# Patient Record
Sex: Male | Born: 1945 | Race: White | Hispanic: No | Marital: Married | Smoking: Never smoker
Health system: Southern US, Community
[De-identification: ages and names within clinical notes are randomized; demographics above are authoritative.]

## PROBLEM LIST (undated history)

## (undated) DIAGNOSIS — M549 Dorsalgia, unspecified: Secondary | ICD-10-CM

## (undated) DIAGNOSIS — T7840XA Allergy, unspecified, initial encounter: Secondary | ICD-10-CM

## (undated) DIAGNOSIS — M48061 Spinal stenosis, lumbar region without neurogenic claudication: Secondary | ICD-10-CM

## (undated) DIAGNOSIS — D696 Thrombocytopenia, unspecified: Secondary | ICD-10-CM

## (undated) DIAGNOSIS — K219 Gastro-esophageal reflux disease without esophagitis: Secondary | ICD-10-CM

## (undated) DIAGNOSIS — R0602 Shortness of breath: Secondary | ICD-10-CM

## (undated) DIAGNOSIS — R079 Chest pain, unspecified: Secondary | ICD-10-CM

## (undated) DIAGNOSIS — F329 Major depressive disorder, single episode, unspecified: Secondary | ICD-10-CM

## (undated) DIAGNOSIS — E785 Hyperlipidemia, unspecified: Secondary | ICD-10-CM

## (undated) DIAGNOSIS — I10 Essential (primary) hypertension: Secondary | ICD-10-CM

## (undated) DIAGNOSIS — F419 Anxiety disorder, unspecified: Secondary | ICD-10-CM

## (undated) DIAGNOSIS — F32A Depression, unspecified: Secondary | ICD-10-CM

## (undated) DIAGNOSIS — M5430 Sciatica, unspecified side: Secondary | ICD-10-CM

## (undated) DIAGNOSIS — Z789 Other specified health status: Secondary | ICD-10-CM

## (undated) DIAGNOSIS — H8109 Meniere's disease, unspecified ear: Secondary | ICD-10-CM

## (undated) DIAGNOSIS — R252 Cramp and spasm: Secondary | ICD-10-CM

## (undated) DIAGNOSIS — I2510 Atherosclerotic heart disease of native coronary artery without angina pectoris: Secondary | ICD-10-CM

## (undated) DIAGNOSIS — Z8719 Personal history of other diseases of the digestive system: Secondary | ICD-10-CM

## (undated) DIAGNOSIS — I4510 Unspecified right bundle-branch block: Secondary | ICD-10-CM

## (undated) DIAGNOSIS — G4730 Sleep apnea, unspecified: Secondary | ICD-10-CM

## (undated) DIAGNOSIS — N400 Enlarged prostate without lower urinary tract symptoms: Secondary | ICD-10-CM

## (undated) DIAGNOSIS — G4733 Obstructive sleep apnea (adult) (pediatric): Secondary | ICD-10-CM

## (undated) HISTORY — PX: BACK SURGERY: SHX140

## (undated) HISTORY — DX: Cramp and spasm: R25.2

## (undated) HISTORY — DX: Thrombocytopenia, unspecified: D69.6

## (undated) HISTORY — DX: Anxiety disorder, unspecified: F41.9

## (undated) HISTORY — DX: Spinal stenosis, lumbar region without neurogenic claudication: M48.061

## (undated) HISTORY — DX: Meniere's disease, unspecified ear: H81.09

## (undated) HISTORY — PX: NASAL POLYP EXCISION: SHX2068

## (undated) HISTORY — DX: Chest pain, unspecified: R07.9

## (undated) HISTORY — PX: TONSILLECTOMY: SHX5217

## (undated) HISTORY — DX: Hyperlipidemia, unspecified: E78.5

## (undated) HISTORY — DX: Atherosclerotic heart disease of native coronary artery without angina pectoris: I25.10

## (undated) HISTORY — DX: Shortness of breath: R06.02

## (undated) HISTORY — DX: Gastro-esophageal reflux disease without esophagitis: K21.9

## (undated) HISTORY — DX: Personal history of other diseases of the digestive system: Z87.19

## (undated) HISTORY — DX: Benign prostatic hyperplasia without lower urinary tract symptoms: N40.0

## (undated) HISTORY — DX: Essential (primary) hypertension: I10

## (undated) HISTORY — DX: Major depressive disorder, single episode, unspecified: F32.9

## (undated) HISTORY — DX: Unspecified right bundle-branch block: I45.10

## (undated) HISTORY — PX: COLONOSCOPY: SHX174

## (undated) HISTORY — DX: Sleep apnea, unspecified: G47.30

## (undated) HISTORY — DX: Allergy, unspecified, initial encounter: T78.40XA

## (undated) HISTORY — DX: Obstructive sleep apnea (adult) (pediatric): G47.33

## (undated) HISTORY — DX: Depression, unspecified: F32.A

## (undated) HISTORY — DX: Other specified health status: Z78.9

## (undated) HISTORY — DX: Dorsalgia, unspecified: M54.9

## (undated) HISTORY — DX: Sciatica, unspecified side: M54.30

## (undated) HISTORY — PX: CARDIAC CATHETERIZATION: SHX172

---

## 1999-03-10 ENCOUNTER — Emergency Department (HOSPITAL_COMMUNITY): Admission: EM | Admit: 1999-03-10 | Discharge: 1999-03-10 | Payer: Self-pay | Admitting: Emergency Medicine

## 1999-11-10 ENCOUNTER — Ambulatory Visit (HOSPITAL_BASED_OUTPATIENT_CLINIC_OR_DEPARTMENT_OTHER): Admission: RE | Admit: 1999-11-10 | Discharge: 1999-11-10 | Payer: Self-pay | Admitting: Otolaryngology

## 2003-11-21 ENCOUNTER — Emergency Department (HOSPITAL_COMMUNITY): Admission: EM | Admit: 2003-11-21 | Discharge: 2003-11-21 | Payer: Self-pay | Admitting: Emergency Medicine

## 2003-12-21 ENCOUNTER — Inpatient Hospital Stay (HOSPITAL_BASED_OUTPATIENT_CLINIC_OR_DEPARTMENT_OTHER): Admission: RE | Admit: 2003-12-21 | Discharge: 2003-12-21 | Payer: Self-pay | Admitting: Cardiology

## 2005-01-24 ENCOUNTER — Ambulatory Visit: Payer: Self-pay | Admitting: Cardiology

## 2005-12-14 ENCOUNTER — Inpatient Hospital Stay (HOSPITAL_COMMUNITY): Admission: EM | Admit: 2005-12-14 | Discharge: 2005-12-17 | Payer: Self-pay | Admitting: Emergency Medicine

## 2005-12-14 ENCOUNTER — Ambulatory Visit: Payer: Self-pay | Admitting: Internal Medicine

## 2007-09-03 ENCOUNTER — Encounter: Payer: Self-pay | Admitting: Family Medicine

## 2007-12-08 ENCOUNTER — Ambulatory Visit: Payer: Self-pay | Admitting: Cardiology

## 2007-12-29 ENCOUNTER — Ambulatory Visit: Payer: Self-pay

## 2008-09-01 ENCOUNTER — Ambulatory Visit: Payer: Self-pay | Admitting: Family Medicine

## 2008-09-01 LAB — CONVERTED CEMR LAB
ALT: 37 units/L (ref 0–53)
AST: 44 units/L — ABNORMAL HIGH (ref 0–37)
Albumin: 4.1 g/dL (ref 3.5–5.2)
Alkaline Phosphatase: 60 units/L (ref 39–117)
BUN: 19 mg/dL (ref 6–23)
Basophils Absolute: 0 10*3/uL (ref 0.0–0.1)
Basophils Relative: 0.1 % (ref 0.0–3.0)
Bilirubin Urine: NEGATIVE
Bilirubin, Direct: 0 mg/dL (ref 0.0–0.3)
Blood in Urine, dipstick: NEGATIVE
CO2: 28 meq/L (ref 19–32)
Calcium: 9.2 mg/dL (ref 8.4–10.5)
Chloride: 107 meq/L (ref 96–112)
Cholesterol: 159 mg/dL (ref 0–200)
Creatinine, Ser: 0.9 mg/dL (ref 0.4–1.5)
Eosinophils Absolute: 0 10*3/uL (ref 0.0–0.7)
Eosinophils Relative: 0.7 % (ref 0.0–5.0)
GFR calc non Af Amer: 90.41 mL/min (ref >60)
Glucose, Bld: 104 mg/dL — ABNORMAL HIGH (ref 70–99)
Glucose, Urine, Semiquant: NEGATIVE
HCT: 40.6 % (ref 39.0–52.0)
HDL: 31.2 mg/dL — ABNORMAL LOW (ref >39.00)
Hemoglobin: 14 g/dL (ref 13.0–17.0)
Ketones, urine, test strip: NEGATIVE
LDL Cholesterol: 106 mg/dL — ABNORMAL HIGH (ref 0–99)
Lymphocytes Relative: 27.5 % (ref 12.0–46.0)
Lymphs Abs: 1.4 10*3/uL (ref 0.7–4.0)
MCHC: 34.4 g/dL (ref 30.0–36.0)
MCV: 88.4 fL (ref 78.0–100.0)
Monocytes Absolute: 0.5 10*3/uL (ref 0.1–1.0)
Monocytes Relative: 8.8 % (ref 3.0–12.0)
Neutro Abs: 3.3 10*3/uL (ref 1.4–7.7)
Neutrophils Relative %: 62.9 % (ref 43.0–77.0)
Nitrite: NEGATIVE
PSA: 0.41 ng/mL (ref 0.10–4.00)
Platelets: 121 10*3/uL — ABNORMAL LOW (ref 150.0–400.0)
Potassium: 4.1 meq/L (ref 3.5–5.1)
RBC: 4.6 M/uL (ref 4.22–5.81)
RDW: 12.9 % (ref 11.5–14.6)
Sodium: 141 meq/L (ref 135–145)
Specific Gravity, Urine: >=1.03
TSH: 1.27 microintl units/mL (ref 0.35–5.50)
Total Bilirubin: 0.9 mg/dL (ref 0.3–1.2)
Total CHOL/HDL Ratio: 5
Total Protein: 7.2 g/dL (ref 6.0–8.3)
Triglycerides: 110 mg/dL (ref 0.0–149.0)
Urobilinogen, UA: 1
VLDL: 22 mg/dL (ref 0.0–40.0)
WBC Urine, dipstick: NEGATIVE
WBC: 5.2 10*3/uL (ref 4.5–10.5)
pH: 6

## 2008-09-13 ENCOUNTER — Ambulatory Visit: Payer: Self-pay | Admitting: Family Medicine

## 2008-09-13 DIAGNOSIS — F39 Unspecified mood [affective] disorder: Secondary | ICD-10-CM | POA: Insufficient documentation

## 2008-09-13 DIAGNOSIS — F3289 Other specified depressive episodes: Secondary | ICD-10-CM

## 2008-09-13 DIAGNOSIS — E785 Hyperlipidemia, unspecified: Secondary | ICD-10-CM

## 2008-09-13 DIAGNOSIS — F329 Major depressive disorder, single episode, unspecified: Secondary | ICD-10-CM

## 2008-09-13 DIAGNOSIS — K219 Gastro-esophageal reflux disease without esophagitis: Secondary | ICD-10-CM

## 2008-09-13 DIAGNOSIS — D696 Thrombocytopenia, unspecified: Secondary | ICD-10-CM

## 2008-09-13 HISTORY — DX: Hyperlipidemia, unspecified: E78.5

## 2008-09-13 HISTORY — DX: Other specified depressive episodes: F32.89

## 2008-09-13 HISTORY — DX: Thrombocytopenia, unspecified: D69.6

## 2008-09-13 HISTORY — DX: Major depressive disorder, single episode, unspecified: F32.9

## 2008-09-13 HISTORY — DX: Gastro-esophageal reflux disease without esophagitis: K21.9

## 2008-10-11 ENCOUNTER — Ambulatory Visit: Payer: Self-pay | Admitting: Family Medicine

## 2008-12-13 ENCOUNTER — Ambulatory Visit: Payer: Self-pay | Admitting: Family Medicine

## 2008-12-14 LAB — CONVERTED CEMR LAB
ALT: 27 units/L (ref 0–53)
AST: 30 units/L (ref 0–37)
Albumin: 4 g/dL (ref 3.5–5.2)
Alkaline Phosphatase: 58 units/L (ref 39–117)
Basophils Absolute: 0 10*3/uL (ref 0.0–0.1)
Basophils Relative: 0.4 % (ref 0.0–3.0)
Bilirubin, Direct: 0.1 mg/dL (ref 0.0–0.3)
Cholesterol: 150 mg/dL (ref 0–200)
Eosinophils Absolute: 0.1 10*3/uL (ref 0.0–0.7)
Eosinophils Relative: 1.6 % (ref 0.0–5.0)
HCT: 41.2 % (ref 39.0–52.0)
HDL: 30.7 mg/dL — ABNORMAL LOW (ref >39.00)
Hemoglobin: 14.1 g/dL (ref 13.0–17.0)
LDL Cholesterol: 90 mg/dL (ref 0–99)
Lymphocytes Relative: 32.1 % (ref 12.0–46.0)
Lymphs Abs: 1.6 10*3/uL (ref 0.7–4.0)
MCHC: 34.2 g/dL (ref 30.0–36.0)
MCV: 88.7 fL (ref 78.0–100.0)
Monocytes Absolute: 0.5 10*3/uL (ref 0.1–1.0)
Monocytes Relative: 9.9 % (ref 3.0–12.0)
Neutro Abs: 2.9 10*3/uL (ref 1.4–7.7)
Neutrophils Relative %: 56 % (ref 43.0–77.0)
Platelets: 127 10*3/uL — ABNORMAL LOW (ref 150.0–400.0)
RBC: 4.65 M/uL (ref 4.22–5.81)
RDW: 13.7 % (ref 11.5–14.6)
Total Bilirubin: 1.2 mg/dL (ref 0.3–1.2)
Total CHOL/HDL Ratio: 5
Total Protein: 7.1 g/dL (ref 6.0–8.3)
Triglycerides: 148 mg/dL (ref 0.0–149.0)
VLDL: 29.6 mg/dL (ref 0.0–40.0)
WBC: 5.1 10*3/uL (ref 4.5–10.5)

## 2008-12-22 ENCOUNTER — Ambulatory Visit: Payer: Self-pay | Admitting: Family Medicine

## 2009-02-09 ENCOUNTER — Telehealth: Payer: Self-pay | Admitting: Family Medicine

## 2009-09-13 ENCOUNTER — Ambulatory Visit: Payer: Self-pay | Admitting: Family Medicine

## 2009-09-13 LAB — CONVERTED CEMR LAB
ALT: 34 units/L (ref 0–53)
AST: 49 units/L — ABNORMAL HIGH (ref 0–37)
Albumin: 4.3 g/dL (ref 3.5–5.2)
Alkaline Phosphatase: 62 units/L (ref 39–117)
BUN: 17 mg/dL (ref 6–23)
Basophils Absolute: 0 10*3/uL (ref 0.0–0.1)
Basophils Relative: 0.7 % (ref 0.0–3.0)
Bilirubin Urine: NEGATIVE
Bilirubin, Direct: 0.1 mg/dL (ref 0.0–0.3)
Blood in Urine, dipstick: NEGATIVE
CO2: 31 meq/L (ref 19–32)
Calcium: 9.5 mg/dL (ref 8.4–10.5)
Chloride: 107 meq/L (ref 96–112)
Cholesterol: 161 mg/dL (ref 0–200)
Creatinine, Ser: 1 mg/dL (ref 0.4–1.5)
Eosinophils Absolute: 0.1 10*3/uL (ref 0.0–0.7)
Eosinophils Relative: 1.6 % (ref 0.0–5.0)
GFR calc non Af Amer: 79.8 mL/min (ref >60)
Glucose, Bld: 100 mg/dL — ABNORMAL HIGH (ref 70–99)
Glucose, Urine, Semiquant: NEGATIVE
HCT: 41.5 % (ref 39.0–52.0)
HDL: 33.9 mg/dL — ABNORMAL LOW (ref >39.00)
Hemoglobin: 14.3 g/dL (ref 13.0–17.0)
Ketones, urine, test strip: NEGATIVE
LDL Cholesterol: 95 mg/dL (ref 0–99)
Lymphocytes Relative: 28 % (ref 12.0–46.0)
Lymphs Abs: 1.6 10*3/uL (ref 0.7–4.0)
MCHC: 34.4 g/dL (ref 30.0–36.0)
MCV: 90.5 fL (ref 78.0–100.0)
Monocytes Absolute: 0.5 10*3/uL (ref 0.1–1.0)
Monocytes Relative: 9.2 % (ref 3.0–12.0)
Neutro Abs: 3.5 10*3/uL (ref 1.4–7.7)
Neutrophils Relative %: 60.5 % (ref 43.0–77.0)
Nitrite: NEGATIVE
PSA: 0.48 ng/mL (ref 0.10–4.00)
Platelets: 131 10*3/uL — ABNORMAL LOW (ref 150.0–400.0)
Potassium: 5.1 meq/L (ref 3.5–5.1)
Protein, U semiquant: NEGATIVE
RBC: 4.58 M/uL (ref 4.22–5.81)
RDW: 13.4 % (ref 11.5–14.6)
Sodium: 145 meq/L (ref 135–145)
Specific Gravity, Urine: 1.02
TSH: 2.96 microintl units/mL (ref 0.35–5.50)
Total Bilirubin: 0.5 mg/dL (ref 0.3–1.2)
Total CHOL/HDL Ratio: 5
Total Protein: 7 g/dL (ref 6.0–8.3)
Triglycerides: 163 mg/dL — ABNORMAL HIGH (ref 0.0–149.0)
Urobilinogen, UA: 0.2
VLDL: 32.6 mg/dL (ref 0.0–40.0)
WBC Urine, dipstick: NEGATIVE
WBC: 5.7 10*3/uL (ref 4.5–10.5)
pH: 5.5

## 2009-09-20 ENCOUNTER — Ambulatory Visit: Payer: Self-pay | Admitting: Family Medicine

## 2009-10-16 ENCOUNTER — Telehealth: Payer: Self-pay | Admitting: Family Medicine

## 2009-10-23 DIAGNOSIS — I2510 Atherosclerotic heart disease of native coronary artery without angina pectoris: Secondary | ICD-10-CM | POA: Insufficient documentation

## 2009-10-23 DIAGNOSIS — G4733 Obstructive sleep apnea (adult) (pediatric): Secondary | ICD-10-CM

## 2009-10-23 HISTORY — DX: Obstructive sleep apnea (adult) (pediatric): G47.33

## 2009-10-23 HISTORY — DX: Atherosclerotic heart disease of native coronary artery without angina pectoris: I25.10

## 2009-10-26 ENCOUNTER — Ambulatory Visit: Payer: Self-pay | Admitting: Cardiology

## 2009-10-26 DIAGNOSIS — I4510 Unspecified right bundle-branch block: Secondary | ICD-10-CM

## 2009-10-26 HISTORY — DX: Unspecified right bundle-branch block: I45.10

## 2009-12-27 ENCOUNTER — Encounter: Payer: Self-pay | Admitting: Gastroenterology

## 2010-06-12 NOTE — Assessment & Plan Note (Signed)
Summary: cpx labs-v70.0//ccm   Vital Signs:  Patient profile:   65 year old male Height:      67.5 inches Weight:      204 pounds BMI:     31.59 Temp:     98.1 degrees F oral Pulse rate:   80 / minute Pulse rhythm:   regular Resp:     12 per minute BP sitting:   132 / 82  (left arm) Cuff size:   regular  Vitals Entered By: Sid Falcon LPN (Sep 20, 2009 10:35 AM)  Nutrition Counseling: Patient's BMI is greater than 25 and therefore counseled on weight management options. CC: CPX, labs done   History of Present Illness: Patient for complete physical examination.  Past medical history reviewed. Past history of mild depression, GERD hyperlipidemia, and mild chronic thrombocytopenia. Seasonal allergies.  Recent achiness in both calf muscles mostly at night. He thinks this may be related to Crestor. No intolerance to walking. Generally exercise about 45 minutes per day. Denies restless leg symptoms. No claudication symptoms.  On low dose Citalopram and has reduced to every other day on his own recently without adverse event.  Queries discontinuing this medication.  Last tetanus unknown. Due for repeat colonoscopy this year which he will set up. Prior history of shingles clinically about 8 years ago.  Social history family history reviewed no significant changes.  Preventive Screening-Counseling & Management  Alcohol-Tobacco     Smoking Status: never  Allergies: 1)  Codeine Sulfate (Codeine Sulfate)  Past History:  Past Medical History: Last updated: 09/13/2008 Depression GERD Hyperlipidemia Heart disease, Non Obstructive CAD  Past Surgical History: Last updated: 09/13/2008 Tonsillectomy  Family History: Last updated: 09/20/2009 Mother CHF ?etiology  Social History: Last updated: 09/13/2008 Occupation:  [employer] & Anna [employer] Married Regular exercise-yes  Risk Factors: Exercise: yes (09/13/2008)  Risk Factors: Smoking Status: never  (09/20/2009)  Family History: Mother CHF ?etiology  Review of Systems  The patient denies anorexia, fever, weight loss, weight gain, vision loss, decreased hearing, hoarseness, chest pain, syncope, dyspnea on exertion, peripheral edema, prolonged cough, headaches, hemoptysis, abdominal pain, melena, hematochezia, severe indigestion/heartburn, hematuria, incontinence, genital sores, muscle weakness, suspicious skin lesions, transient blindness, difficulty walking, depression, unusual weight change, abnormal bleeding, enlarged lymph nodes, and testicular masses.         Patient has intermittent bilateral calf pain at night  Physical Exam  General:  Well-developed,well-nourished,in no acute distress; alert,appropriate and cooperative throughout examination Head:  Normocephalic and atraumatic without obvious abnormalities. No apparent alopecia or balding. Eyes:  No corneal or conjunctival inflammation noted. EOMI. Perrla. Funduscopic exam benign, without hemorrhages, exudates or papilledema. Vision grossly normal. Ears:  External ear exam shows no significant lesions or deformities.  Otoscopic examination reveals clear canals, tympanic membranes are intact bilaterally without bulging, retraction, inflammation or discharge. Hearing is grossly normal bilaterally. Mouth:  Oral mucosa and oropharynx without lesions or exudates.  Teeth in good repair. Neck:  No deformities, masses, or tenderness noted. Lungs:  Normal respiratory effort, chest expands symmetrically. Lungs are clear to auscultation, no crackles or wheezes. Heart:  Normal rate and regular rhythm. S1 and S2 normal without gallop, murmur, click, rub or other extra sounds. Abdomen:  Bowel sounds positive,abdomen soft and non-tender without masses, organomegaly or hernias noted. Msk:  No deformity or scoliosis noted of thoracic or lumbar spine.   Extremities:  No clubbing, cyanosis, edema, or deformity noted with normal full range of motion  of all joints.   Neurologic:  No  cranial nerve deficits noted. Station and gait are normal. Plantar reflexes are down-going bilaterally. DTRs are symmetrical throughout. Sensory, motor and coordinative functions appear intact. Skin:  Intact without suspicious lesions or rashes Cervical Nodes:  No lymphadenopathy noted Psych:  normally interactive, good eye contact, not anxious appearing, and not depressed appearing.     Impression & Recommendations:  Problem # 1:  ROUTINE GENERAL MEDICAL EXAM@HEALTH  CARE FACL (ICD-V70.0) work on weight  loss.  Labs reviewed with pt.  Pt will set up repeat colonoscopy.  Tdap given.  Problem # 2:  HYPERLIPIDEMIA (ICD-272.4) ?leg myalgias related to Crestor.  Pt will d/c Crestor for one month and if symptoms resolve OFF med try back low dose 5mg  Crestor which he has tolerated in past. His updated medication list for this problem includes:    Crestor 10 Mg Tabs (Rosuvastatin calcium) ..... One by mouth once daily  Complete Medication List: 1)  Fexofenadine Hcl 180 Mg Tabs (Fexofenadine hcl) .... Once daily 2)  Crestor 10 Mg Tabs (Rosuvastatin calcium) .... One by mouth once daily 3)  Ra Omeprazole 20 Mg Tbec (Omeprazole) .... Once tab every other day 4)  Adult Aspirin Ec Low Strength 81 Mg Tbec (Aspirin) .... Once daily 5)  Fluticasone Propionate 50 Mcg/act Susp (Fluticasone propionate) .... 2 sprays to each nostril daily  Other Orders: Tdap => 52yrs IM ([address]) Admin 1st Vaccine (60454)  Patient Instructions: 1)  It is important that you exercise reguarly at least 20 minutes 5 times a week. If you develop chest pain, have severe difficulty breathing, or feel very tired, stop exercising immediately and seek medical attention.  2)  You need to lose weight. Consider a lower calorie diet and regular exercise.  3)  discontinue iron supplement 4)  Discontinue citalopram 5)  Schedule repeat colonoscopy     Immunizations Administered:  Tetanus Vaccine:     Vaccine Type: Tdap    Site: left deltoid    Mfr: GlaxoSmithKline    Dose: 0.5 ml    Route: IM    Given by: Sid Falcon LPN    Exp. Date: 08/05/2011    Lot #: [id number]

## 2010-06-12 NOTE — Letter (Signed)
Summary: New Patient letter  [facility]  [address] [place], [place] [address]   Phone: [phone]  Fax: [phone]       12/27/2009 MRN: [id number]  [facility] [address] [place], [place]  [address]  Dear Mr. Troost,  Welcome to the Gastroenterology Division at [facility].    You are scheduled to see Dr.  Jarold Motto on  02-22-10 at 9am on the 3rd floor at [facility], [address].  We ask that you try to arrive at our office 15 minutes prior to your appointment time to allow for check-in.  We would like you to complete the enclosed self-administered evaluation form prior to your visit and bring it with you on the day of your appointment.  We will review it with you.  Also, please bring a complete list of all your medications or, if you prefer, bring the medication bottles and we will list them.  Please bring your insurance card so that we may make a copy of it.  If your insurance requires a referral to see a specialist, please bring your referral form from your primary care physician.  Co-payments are due at the time of your visit and may be paid by cash, check or credit card.     Your office visit will consist of a consult with your physician (includes a physical exam), any laboratory testing he/she may order, scheduling of any necessary diagnostic testing (e.g. x-ray, ultrasound, CT-scan), and scheduling of a procedure (e.g. Endoscopy, Colonoscopy) if required.  Please allow enough time on your schedule to allow for any/all of these possibilities.    If you cannot keep your appointment, please call [phone] to cancel or reschedule prior to your appointment date.  This allows [place] the opportunity to schedule an appointment for another patient in need of care.  If you do not cancel or reschedule by 5 p.m. the business day prior to your appointment date, you will be charged a $50.00 late cancellation/no-show fee.    Thank you for choosing   Gastroenterology for your medical needs.  We appreciate the opportunity to care for you.  Please visit [place] at our website  to learn more about our practice.                     Sincerely,                                                             The Gastroenterology Division

## 2010-06-12 NOTE — Letter (Signed)
Summary: Records from [facility] 2006 - 2009  Records from [facility] 2006 - 2009   Imported By: Maryln Gottron 09/29/2008 14:09:43  _____________________________________________________________________  External Attachment:    Type:   Image     Comment:   External Document

## 2010-06-12 NOTE — Assessment & Plan Note (Signed)
Summary: f2y/jss   Visit Type:  2 yr f/u Primary Provider:  Evelena Peat MD  CC:  pt c/o bilateral leg pain, says they are not leg cramps, and denies any pain when walking...denies any cp or sob or edema.  History of Present Illness: Mr Stickels her returns today for evaluation management of his coronary disease.  He's having no angina or ischemic symptoms. His biggest complaint is that of his legs aching at night to the point he can't sleep. He ran a stop primary care he's been referred to Dr. Darrick Penna of sports medicine. He denies any claudication or symptoms of circulatory insufficiency. It is not positional. He denies any injury to his back. He's had no bladder or bowel problems.  He denies any symptoms of orthopnea, PND or edema. He's had no palpitations or syncope. He has a chronic right bundle.  Current Medications (verified): 1)  Crestor 5 Mg Tabs (Rosuvastatin Calcium) .Marland Kitchen.. 1 Tab At Bedtime 2)  Ra Omeprazole 20 Mg Tbec (Omeprazole) .... Once Tab Every Other Day 3)  Adult Aspirin Ec Low Strength 81 Mg Tbec (Aspirin) .... Once Daily 4)  Fluticasone Propionate 50 Mcg/act Susp (Fluticasone Propionate) .... 2 Sprays To Each Nostril Daily 5)  Citalopram Hydrobromide 10 Mg Tabs (Citalopram Hydrobromide) .Marland Kitchen.. 1 Tab Every Other Day  Allergies: 1)  Codeine Sulfate (Codeine Sulfate)  Past History:  Past Medical History: Last updated: 11/20/09 Depression GERD Hyperlipidemia Heart disease, Non Obstructive CAD History of false-positive stress test suggestive of inferior ischemia. Obstructive sleep apnea on C-PAP. Possible benign prostatic hypertrophy.  Past Surgical History: Last updated: 09/13/2008 Tonsillectomy  Family History: Last updated: 11-20-2009  Mother deceased at age 46, history of congestive heart  failure.  Father deceased age 18, questionable myocardial infarction;  history of COPD.  Sister age 57, hypertension and hyperlipidemia.  Social History: Last updated:  2009/11/20 Occupation:  [employer] & [employer] Married Regular exercise-yes Tobacco Use - No.  Alcohol Use - yes Drug Use - no  Risk Factors: Exercise: yes (09/13/2008)  Risk Factors: Smoking Status: never (11-20-09)  Review of Systems       nother than history of present illness  Vital Signs:  Patient profile:   65 year old male Height:      67.5 inches Weight:      196 pounds BMI:     30.35 Pulse rate:   73 / minute Pulse rhythm:   irregular BP sitting:   118 / 70  (left arm) Cuff size:   large  Vitals Entered By: Danielle Rankin, CMA (October 26, 2009 3:13 PM)  Physical Exam  General:  Well developed, well nourished, in no acute distress. Head:  normocephalic and atraumatic Eyes:  PERRLA/EOM intact; conjunctiva and lids normal. Neck:  Neck supple, no JVD. No masses, thyromegaly or abnormal cervical nodes. Chest Kendrix Orman:  no deformities or breast masses noted Lungs:  Clear bilaterally to auscultation and percussion. Heart:  Non-displaced PMI, chest non-tender; regular rate and rhythm, S1, S2 without murmurs, rubs or gallops. Carotid upstroke normal, no bruit. Normal abdominal aortic size, no bruits. Femorals normal pulses, no bruits. Pedals normal pulses. No edema, no varicosities. Abdomen:  Bowel sounds positive; abdomen soft and non-tender without masses, organomegaly, or hernias noted. No hepatosplenomegaly. Msk:  Back normal, normal gait. Muscle strength and tone normal. Pulses:  pulses normal in all 4 extremities Extremities:  No clubbing or cyanosis. Neurologic:  Alert and oriented x 3. Skin:  Intact without lesions or rashes. Psych:  Normal  affect.   Problems:  Medical Problems Added: 1)  Dx of Rbbb  (ICD-426.4)  Impression & Recommendations:  Problem # 1:  CAD, NATIVE VESSEL (ICD-414.01) Assessment Unchanged  His updated medication list for this problem includes:    Adult Aspirin Ec Low Strength 81 Mg Tbec (Aspirin) ..... Once daily  Problem # 2:   OBSTRUCTIVE SLEEP APNEA (ICD-327.23)  Problem # 3:  HYPERLIPIDEMIA (ICD-272.4)  His updated medication list for this problem includes:    Crestor 5 Mg Tabs (Rosuvastatin calcium) .Marland Kitchen... 1 tab at bedtime  Problem # 4:  RBBB (ICD-426.4)  His updated medication list for this problem includes:    Adult Aspirin Ec Low Strength 81 Mg Tbec (Aspirin) ..... Once daily  Clinical Reports Reviewed:  Cardiac Cath:  12/16/2005: Cardiac Cath Findings:   COMPLICATIONS:  None.   FINDINGS:  1. LV:  119/4/9.  EF 65% without regional Anjel Perfetti motion abnormality.  2. No aortic stenosis or mitral regurgitation.  3. Left main:  Angiographically normal.  4. LAD:  Moderate-sized vessel giving rise to two small diagonals.  The      proximal LAD has a 20% stenosis.  A septal has an 80% stenosis at its      ostium.  The mid LAD has a focal 30% stenosis.  5. Ramus intermedius:  Fairly small vessel which is angiographically      normal.  6. Circumflex:  Small vessel with 30% stenosis in the mid vessel.  7. RCA:  Large, dominant vessel.  There is a calcified 30% stenosis of the      mid vessel.  There is a focal 40% stenosis approximately 20 mm prior to      the origin of the PDA.   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  I do not think  that this septal is the etiology of his chest discomfort.  Instead, I  suspect a noncardiac etiology to his chest pain.  Given his substantial  burden of atherosclerosis, aggressive secondary prevention should be  continued.   Salvadore Farber, M.D. [facility]  Electronically Signed  12/21/2003: Cardiac Cath Findings:   LEFT VENTRICULOGRAM:  The left ventriculogram performed in the RAO  projection showed good Binnie Droessler motion with no areas of hypokinesis.  The  estimated ejection fraction was 60%.   DISTAL AORTOGRAM:  A distal aortogram was performed which showed patent  renal arteries  and no significant aortoiliac obstruction.   CONCLUSIONS:  Mild, non-obstructive coronary artery disease and normal left  ventricular function.   RECOMMENDATIONS:  Reassurance.  In view of these findings, I think the  patient's recent symptoms are not cardiac in etiology.  I do not think his  scan has significance and I think his scan represents a false positive test  possibly related to diaphragmatic attenuation.  Will plan reassurance and  plan secondary risk factor modification.                                             Charlies Constable, M.D. [facility]  Nuclear Study:  12/29/2007:  Excerise capacity: Good exercise capacity  Blood Pressure response: Normal blood pressure response  Clinical symptoms: No chest pain or dyspnea  ECG impression: No significant ST segment change suggestive of ischemia  Overall impression: Low risk stress nuclear study  with probable diaphragmatic attenuation and very mild inferior ischemia.  Olga Millers, MD   Patient Instructions: 1)  Your physician recommends that you schedule a follow-up appointment in: YEAR WITH DR Houda Brau 2)  Your physician recommends that you continue on your current medications as directed. Please refer to the Current Medication list given to you today.

## 2010-06-12 NOTE — Progress Notes (Signed)
Summary: speak to Dr. Caryl Never  Phone Note Call from Patient   Summary of Call: Pt is not having any improvement with leg pain since going off of the statins...Marland KitchenMarland KitchenWould like Dr. Caryl Never to cal him, please.  Has some other issues he would like to talk to him about.  [phone] Initial call taken by: Lynann Beaver CMA,  October 16, 2009 3:35 PM  Follow-up for Phone Call        spoke with pt.  He has rest pain in both calves which has not improved off crestor.  No exercise assoc symptoms.  No low back pain or any radicuopathy symptoms.  Pt inquires about [facility] and I think that would be an appropriate place to be evaluated.  He will inquire about getting this set up. Follow-up by: Evelena Peat MD,  October 16, 2009 5:40 PM

## 2010-06-12 NOTE — Assessment & Plan Note (Signed)
Summary: CPX/RCD   Vital Signs:  Patient profile:   65 year old male Height:      67.5 inches Weight:      203 pounds BMI:     31.44 Temp:     98.5 degrees F BP sitting:   130 / 80  (left arm) Cuff size:   regular  Vitals Entered By: Sid Falcon LPN (Sep 13, 1608 9:10 AM) CC: Previous [place] pt, to establish, CPX, labs printed   History of Present Illness: Patient is a 65 year old male who is seen today for complete physical examination. We are waiting for old records. He had colonoscopy which he thinks has been within the past 10 years. He also thinks his last tetanus was less than 10 years ago. He has chronic medical problems including nonobstructive coronary artery disease, hyperlipidemia, prediabetes, history of depression, GERD, and perennial allergies. He sees an allergist. His depression is stable. He takes Crestor regularly at 5 mg daily. No recent myalgias. Patient had a stress test approximately one year ago which showed no signs of ischemia. He exercises regularly and has not had any recent exertional symptoms. He has gained about 3-5 pounds in weight over the past year.  Preventive Screening-Counseling & Management     Smoking Status: never     Does Patient Exercise: yes  Allergies (verified): 1)  ! 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    Married    Regular exercise-yes     (09/13/2008)  Risk Factors:    Alcohol Use: N/A    >5 drinks/d w/in last 3 months: N/A    Caffeine Use: N/A    Diet: N/A    Exercise: yes (09/13/2008)  Risk Factors:    Smoking Status: never (09/13/2008)    Packs/Day: N/A    Cigars/wk: N/A    Pipe Use/wk: N/A    Cans of tobacco/wk: N/A    Passive Smoke Exposure: N/A  Past Medical History:    Depression    GERD    Hyperlipidemia    Heart disease, Non Obstructive CAD  Past  Surgical History:    Tonsillectomy  Family History:    Family History of Cardiovascular disorder  Social History:    Occupation:  Publishing rights manager    Married    Regular exercise-yes    Smoking Status:  never    Occupation:  employed    Does Patient Exercise:  yes  Review of Systems       The patient complains of weight gain.  The patient denies anorexia, fever, weight loss, vision loss, decreased hearing, chest pain, syncope, dyspnea on exertion, peripheral edema, prolonged cough, headaches, hemoptysis, abdominal pain, melena, hematochezia, severe indigestion/heartburn, hematuria, suspicious skin lesions, depression, abnormal bleeding, and testicular masses.    Physical Exam  General:  patient is alert and moderately obese Head:  Normocephalic and atraumatic without obvious abnormalities. No apparent alopecia or balding. Eyes:  No corneal or conjunctival inflammation noted. EOMI. Perrla. Funduscopic exam benign, without hemorrhages, exudates or papilledema. Vision grossly normal. Ears:  External ear exam shows no significant lesions or deformities.  Otoscopic examination reveals clear canals, tympanic membranes are intact bilaterally without bulging, retraction, inflammation or discharge. Hearing is grossly normal bilaterally. Mouth:  Oral mucosa and oropharynx without lesions or exudates.  Teeth in good repair. Neck:  no  masses. No carotid bruits. Lungs:  clear to auscultation throughout Heart:  regular rhythm and rate normal S1-S2. No S3. Abdomen:  soft and nontender without mass. No organomegaly noted. Rectal:  No external abnormalities noted. Normal sphincter tone. No rectal masses or tenderness. Prostate:  prostate on the small side and symmetrical no nodules Extremities:  no edema Neurologic:  No cranial nerve deficits noted. Station and gait are normal. Plantar reflexes are down-going bilaterally. DTRs are symmetrical throughout. Sensory, motor and coordinative  functions appear intact. Skin:  no suspicious skin lesions noted Cervical Nodes:  No lymphadenopathy noted   Impression & Recommendations:  Problem # 1:  Preventive Health Care (ICD-V70.0) Awaiting old records to confirm date of last colonoscopy and immunizations. Work on weight loss and continue regular exercise. Increase Crestor to tighten up his lipid control with recent LDL 106.  Problem # 2:  HYPERLIPIDEMIA (ICD-272.4) Goal LDL < 70.  Increase crestor to 10 mg. His updated medication list for this problem includes:    Crestor 10 Mg Tabs (Rosuvastatin calcium) ..... One by mouth once daily  Problem # 3:  THROMBOCYTOPENIA (ICD-287.5) Mild and possibly chronic. We'll recheck CBC in 3 months to reassess  Complete Medication List: 1)  Fexofenadine Hcl 180 Mg Tabs (Fexofenadine hcl) .... Once daily 2)  Citalopram Hydrobromide 10 Mg Tabs (Citalopram hydrobromide) .... Once daily 3)  Crestor 10 Mg Tabs (Rosuvastatin calcium) .... One by mouth once daily 4)  Ra Omeprazole 20 Mg Tbec (Omeprazole) .... Once daily 5)  Adult Aspirin Ec Low Strength 81 Mg Tbec (Aspirin) .... Once daily 6)  Fluticasone Propionate 50 Mcg/act Susp (Fluticasone propionate) .... 2 sprays to each nostril daily  Patient Instructions: 1)  It is important that you exercise reguarly at least 20 minutes 5 times a week. If you develop chest pain, have severe difficulty breathing, or feel very tired, stop exercising immediately and seek medical attention.  2)  You need to lose weight. Consider a lower calorie diet and regular exercise.  3)  Return in 3 months to repeat labs including lipid panel, hepatic panel, and CBC Prescriptions: CRESTOR 10 MG TABS (ROSUVASTATIN CALCIUM) one by mouth once daily  #90 x 3   Entered and Authorized by:   Evelena Peat MD   Signed by:   Evelena Peat MD on 09/13/2008   Method used:   Print then Give to Patient   RxID:   [id number] CITALOPRAM HYDROBROMIDE 10 MG TABS (CITALOPRAM  HYDROBROMIDE) once daily  #90 x 3   Entered and Authorized by:   Evelena Peat MD   Signed by:   Evelena Peat MD on 09/13/2008   Method used:   Print then Give to Patient   RxID:   [id number]

## 2010-06-12 NOTE — Assessment & Plan Note (Signed)
Summary: 3 MONTH ROV/NJR RSC PER PT/NJR   Vital Signs:  Patient profile:   65 year old male Weight:      198 pounds Temp:     98 degrees F oral BP sitting:   140 / 80  (left arm) Cuff size:   regular  Vitals Entered By: Sid Falcon LPN (December 22, 2008 10:53 AM) CC: 3 month ROV, F/U on Hyperlipidemia   History of Present Illness: Patient seen for followup. Recent mild thrombocytopenia and repeat lab work last week with stable platelet count 127,000. Normal white blood count and normal hematocrit. Current medications are reviewed. Compliant with medications. Recent lipids stable. Low HDL of 30. Patient exercises with walking. Occasional wine use. Liver functions stable. Patient denies any recent chest pains.  Allergies: 1)  Codeine Sulfate (Codeine Sulfate)  Past History:  Past Medical History: Last updated: 09/13/2008 Depression GERD Hyperlipidemia Heart disease, Non Obstructive CAD  Review of Systems  The patient denies weight loss, weight gain, chest pain, dyspnea on exertion, peripheral edema, prolonged cough, hemoptysis, and abdominal pain.    Physical Exam  General:  Well-developed,well-nourished,in no acute distress; alert,appropriate and cooperative throughout examination Mouth:  Oral mucosa and oropharynx without lesions or exudates.  Teeth in good repair. Neck:  No deformities, masses, or tenderness noted. Lungs:  Normal respiratory effort, chest expands symmetrically. Lungs are clear to auscultation, no crackles or wheezes. Heart:  Normal rate and regular rhythm. S1 and S2 normal without gallop, murmur, click, rub or other extra sounds. Extremities:  no peripheral edema noted.   Impression & Recommendations:  Problem # 1:  THROMBOCYTOPENIA (ICD-287.5) this appears to be chronic and recent labs are stable. No further evaluation recommended at this time.  Problem # 2:  HYPERLIPIDEMIA (ICD-272.4) discussed importance of regular exercise. We discussed other  possibilities such as niacin for low HDL at this point he wishes to wait. His updated medication list for this problem includes:    Crestor 10 Mg Tabs (Rosuvastatin calcium) ..... One by mouth once daily  Complete Medication List: 1)  Fexofenadine Hcl 180 Mg Tabs (Fexofenadine hcl) .... Once daily 2)  Citalopram Hydrobromide 10 Mg Tabs (Citalopram hydrobromide) .... Once daily 3)  Crestor 10 Mg Tabs (Rosuvastatin calcium) .... One by mouth once daily 4)  Ra Omeprazole 20 Mg Tbec (Omeprazole) .... Once daily 5)  Adult Aspirin Ec Low Strength 81 Mg Tbec (Aspirin) .... Once daily 6)  Fluticasone Propionate 50 Mcg/act Susp (Fluticasone propionate) .... 2 sprays to each nostril daily  Patient Instructions: 1)  Please schedule a follow-up appointment in 6 months .

## 2010-09-09 ENCOUNTER — Ambulatory Visit (HOSPITAL_BASED_OUTPATIENT_CLINIC_OR_DEPARTMENT_OTHER): Payer: 59 | Attending: Otolaryngology

## 2010-09-09 DIAGNOSIS — G4733 Obstructive sleep apnea (adult) (pediatric): Secondary | ICD-10-CM | POA: Insufficient documentation

## 2010-09-15 DIAGNOSIS — G4733 Obstructive sleep apnea (adult) (pediatric): Secondary | ICD-10-CM

## 2010-09-16 NOTE — Procedures (Signed)
NAME:  Donald Pope, Donald Pope                ACCOUNT NO.:  [id number]  MEDICAL RECORD NO.:  [id number]          PATIENT TYPE:  OUT  LOCATION:  SLEEP CENTER                 FACILITY:  [facility]  PHYSICIAN:  Clinton D. Maple Hudson, MD, FCCP, FACPDATE OF BIRTH:  12-25-44  DATE OF STUDY:  09/09/2010                           NOCTURNAL POLYSOMNOGRAM  REFERRING PHYSICIAN:  CHRISTOPHER E NEWMAN  INDICATION FOR STUDY:  Hypersomnia with sleep apnea.  EPWORTH SLEEPINESS SCORE:  9/24.  BMI 28.9.  Weight 190 pounds.  Height 68 inches.  MEDICATIONS:  Home medications charted and reviewed.  SLEEP ARCHITECTURE:  Total sleep time 272 minutes with sleep efficiency 68.4%.  Stage I was 34.4%, stage II 64.5%, stage III absent, REM 1.1% of total sleep time.  Sleep latency 8 minutes, REM latency 278 minutes, awake after sleep onset 117.5 minutes, arousal index 63.5.  Bedtime Medication:  Advil.  Sleep architecture was marked by very frequent awakenings throughout the first two-thirds of the study until approximately 2:15 a.m.  This did not seem to correspond specifically to scored respiratory events and may benefit from management of the insomnia if it is a pattern seen at home.  RESPIRATORY DATA:  Apnea/hypopnea index (AHI) 35.5 per hour.  A total of 161 events was scored including 87 obstructive apneas, 3 mixed apneas, 71 hypopneas.  Events were associated with supine sleep.  REM AHI 20 per hour.  Most respiratory events were clustered in the last half of the night.  There were insufficient early events to permit introduction of CPAP titration by split protocol on the study night.  OXYGEN DATA:  CARDIAC DATA:  Normal sinus rhythm.  MOVEMENT-PARASOMNIA:  No significant movement disturbance.  Bathroom x1.  IMPRESSIONS-RECOMMENDATIONS: 1. Severe obstructive sleep apnea/hypopnea syndrome, apnea/hypopnea     index 35.5 per hour with moderately loud snoring, supine events,     oxygen desaturation to a nadir of  89% on room air. 2. Because most respiratory events clustered too late in the night,     protocol requirements were not met for introduction of CPAP     titration by split protocol on this study night.  Consider return     for dedicated CPAP titration study or evaluate for alternative     management as clinically appropriate. 3. The first two-thirds of the night were marked by frequent     spontaneous awakening.  This may have partly corresponded to minor     unscored respiratory events (RERA's, RDI 72.1 per hour), but many     appear to be spontaneous wakings unrelated to observed events.     This pattern might benefit from a trial of a sleep aide for     insomnia if it persists in the home environment.     Clinton D. Maple Hudson, MD, [facility], FACP Diplomate, Biomedical engineer of Sleep Medicine Electronically Signed    CDY/MEDQ  D:  09/15/2010 11:11:32  T:  09/15/2010 23:54:49  Job:  [id number]

## 2010-09-25 NOTE — Assessment & Plan Note (Signed)
[facility]                            CARDIOLOGY OFFICE NOTE   CHEE, KINSLOW                       MRN:          [id number]  DATE:12/08/2007                            DOB:          1946/01/12    Donald Pope comes in today after nearly a 3-year absence from the  practice for questions concerning statin related joint pain.   He has nonobstructive coronary disease with an EF of 60%, 40% mid right  coronary stenosis, 30% proximal mid LAD.  A catheterization was done in  2005 after having an abnormal Cardiolite which showed mild inferior wall  ischemia.   His risk factors include male sex, age, hyperlipidemia.   He had excellent numbers on Vytorin 10/20, total cholesterol 132, HDL  38, LDL 62, triglycerides 158.  This was checked by Dr. Doristine Counter back in  April 2009.   He then began to have some aching in his left hip and down to his left  side of his leg.  His Vytorin was discontinued.  He was placed on  Crestor 5 mg a day.  He still notices symptoms.  He stopped it about 3  weeks ago.  He has noticed maybe a slight improvement but not  convincing.   He has had no other muscle pain or aching.   His current meds are Celexa 10 mg a day, Flonase 2 sprays, aspirin 81 mg  a day, multivitamin daily, Prilosec over the counter 20 mg a day, fish  oil 1200 mg a day, vitamin D, Claritin over the counter.   His blood pressure today is 135/84, pulse 72 and regular.  His EKG is  essentially normal except for a right bundle which is old.  His weight  is 199, up 5.  He is in no acute distress.  Skin is warm and dry.  His  face is ruddy.  PERRL.  Extraocular movements are intact.  Sclerae are  clear.  Face symmetry is normal.  Carotids upstrokes are equal  bilaterally without bruits, no JVD.  Thyroid is not enlarged.  Trachea  is midline.  Lungs are clear.  Heart reveals a poorly appreciated PMI.  Normal S1 and S2.  Abdominal exam is soft.  Good bowel  sounds.  No  midline bruit.  Extremities, no cyanosis, clubbing, or edema.  Pulses  are intact.  Neuro exam is intact.   ASSESSMENT/PLAN:  I had a long talk with Donald Pope and his wife today.  I feel strongly this is not a statin-related side effect.  I feel  strongly also he should be on a statin.  I have gone over at length with  a heart model and a blood vessel model showing how we can decrease  plaque progression and also plaque instability with statins.  Also, I  reviewed the recent [place] trial.   After a long discussion, he was started back on his Crestor 5 mg a day.  Goal LDL will be 70 or less.  He may need an escalation of dose.  He  promises to have blood work done with Dr.  Burchette to determine this in  about 6 weeks.   He is also way overdue for objective assessment of his coronaries.  I  have arranged an exercise rest stress Myoview.  Assuming this is  negative or shows only minimal inferior wall ischemia which he did in  the past, we will continue with medical therapy.  I will see him back  again in a year.     Thomas C. Daleen Squibb, MD, [facility]  Electronically Signed    TCW/MedQ  DD: 12/08/2007  DT: 12/09/2007  Job #: [id number]   cc:   Evelena Peat, M.D.

## 2010-09-28 NOTE — H&P (Signed)
NAME:  Donald Pope, Donald Pope NO.:  [id number]   MEDICAL RECORD NO.:  [id number]          PATIENT TYPE:  EMS   LOCATION:  MAJO                         FACILITY:  [facility]   PHYSICIAN:  Arvilla Meres, M.D. LHCDATE OF BIRTH:  09-23-1945   DATE OF ADMISSION:  12/14/2005  DATE OF DISCHARGE:                                HISTORY & PHYSICAL   PRIMARY CARDIOLOGIST:  Maisie Fus C. Wall, M.D.   REASON FOR ADMISSION:  [name] is a 65 year old male with history of  nonobstructive coronary artery disease with normal ventricular function by  previous cardiac catheterization August 2005.  He was being evaluated for  chest pain and had a stress Cardiolite test which was suggestive of inferior  ischemia.  However, coronary angiography showed mild nonobstructive CAD with  normal ventricular function and no wall motion abnormalities.   The patient does have cardiac risk factors notable for hyperlipidemia,  family history, and age.  He has not had any subsequent cardiac testing  since his catheterization.   The patient presents with no recent development of exertional angina  pectoris or dyspnea; however, he does report feeling somewhat fatigued over  these past 2-3 days, after having returned home from work.  These past few  days, additionally, he has noted development of anterior chest discomfort  described as a heaviness with bilateral radiation to both arms down to the  elbow.  These episodes have been unpredictable in onset with no clear  correlation with exertion.  This morning he awakened with a sensation of  this discomfort and also noted some mild diaphoresis but no dyspnea or  nausea.  He got up to move around and did not note any exacerbation of the  discomfort with walking or activity, nor with deep inspiration or movement.  Given the persistent nature of the symptoms, he decided to present to the  emergency room where he has been treated with aspirin but no nitroglycerin.  His symptoms and subsequently subsided and then completely resolved.  He is  currently pain free.   Initial cardiac markers have been normal and an electrocardiogram shows  normal sinus rhythm with no ischemic changes.   ALLERGIES:  1.  CODEINE.  2.  NIACIN INTOLERANCE.   HOME MEDICATIONS:  1.  Aspirin 81 mg every day.  2.  Vytorin 10/20 mg every day.  3.  Allegra 180 mg every day.  4.  Glucosamine chondroitin.  5.  Citalopram 10 mg every day.  6.  Fluticasone nasal spray 50 mcg 2 sprays daily.  7.  Prilosec OTC.  8.  CPAP.   PAST MEDICAL HISTORY:  1.  Nonobstructive coronary artery disease.      1.  A 30% proximal to mid LAD; 40% mid RCA with normal left ventricular          function by cardiac catheterization August 2005.      2.  False positive stress Cardiolite suggestive of inferior ischemia.  2.  Hyperlipidemia.      1.  Niacin intolerant.  3.  Obstructive sleep apnea on CPAP.   SURGICAL HISTORY:  None.  SOCIAL HISTORY:  The patient lives here in [place] with his wife.  They  have a 61 year old son.  He continues to work part-time at the [employer] and  [employer]  He has never smoked tobacco and drinks a moderate amount  of red wine.   FAMILY HISTORY:  Mother deceased at age 50, history of congestive heart  failure.  Father deceased age 106, questionable myocardial infarction;  history of COPD.  Sister age 18, hypertension and hyperlipidemia.   REVIEW OF SYSTEMS:  Denies any recent exertional angina or dyspnea,  orthopnea, PND, or lower extremity edema.  Is able to climb a flight of  stairs with no significant associated dyspnea and no chest pain.  Has  symptoms of reflux but controlled on OTC proton pump inhibitor.  Denies any  recent overt evidence of upper or lower GI bleeding.  Otherwise as noted per  HPI, remaining systems negative.   PHYSICAL EXAMINATION:  VITAL SIGNS:  Blood pressure 128/79, pulse 70  regular, respirations 20, temperature 97.1.   GENERAL:  A 65 year old male in no apparent distress.  HEENT:  Normocephalic, atraumatic.  NECK:  Palpable carotid pulses without bruits.  LUNGS:  Clear to auscultation all fields.  HEART:  Regular rate rhythm (S1-S2).  No murmurs, rubs or gallops.  ABDOMEN:  Soft, nontender with intact bowel sounds.  EXTREMITIES:  Palpable femoral and distal pulses; no edema.  NEUROLOGIC:  No focal deficit.   Admission chest x-ray:  Pending.  Admission electrocardiogram:  Normal sinus  rhythm at 77 BPM with normal axis; no ischemic changes.   LABORATORY DATA:  Cardiac enzymes (POC):  MB less than 1.0 and troponin I  less than 0.05 (x2).  Hemoglobin 15.3, hematocrit 45.  Sodium 140, potassium  4.1, BUN 12, creatinine 0.9, glucose 90.   IMPRESSION:  1.  Progressive chest discomfort, typical and atypical features.  2.  Nonobstructive coronary artery disease.      1.  Cardiac catheterization August 2005.  3.  Recent exertional dyspnea, fatigue,      1.  History of normal left ventricular function.  4.  Hyperlipidemia.  5.  Gastroesophageal reflux disease.  6.  Sleep apnea.   PLAN:  1.  The patient will be admitted to telemetry unit for close monitoring and      further evaluation with cycling of cardiac markers and repeat EKG in the      morning.  2.  The option of proceeding with cardiac catheterization for definitive      exclusion of CAD progression versus an outpatient exercise stress      Cardiolite were reviewed with the patient.  Although the patient      presents with no objective evidence of ischemia and his history is not      entirely consistent with coronary artery disease progression, he and his      wife have elected to proceed with diagnostic coronary angiography on      Monday.  We will therefore proceed with this option and risks/benefits      of the procedure have been discussed with the patient, who is agreeable     to proceed.  3.  The patient will be continued on current home  medication medications      with the addition of beta blocker and Lovenox.  4.  In addition to cycling cardiac markers, we will check a TSH level, D-      dimer, and a fasting lipid profile in the morning.  5.  Given his history of known gastroesophageal reflux disease, the patient      will also be placed on a proton pump inhibitor.      Gene Serpe, P.A. [facility]      Arvilla Meres, M.D. [facility]  Electronically Signed    GS/MEDQ  D:  12/14/2005  T:  12/14/2005  Job:  [id number]   cc:   Evelena Peat, M.D.

## 2010-09-28 NOTE — Cardiovascular Report (Signed)
NAME:  BRECK, MARYLAND                          ACCOUNT NO.:  [id number]   MEDICAL RECORD NO.:  [id number]                   PATIENT TYPE:  OIB   LOCATION:  6501                                 FACILITY:  [facility]   PHYSICIAN:  Charlies Constable, M.D. [facility]              DATE OF BIRTH:  09/25/1945   DATE OF PROCEDURE:  12/21/2003  DATE OF DISCHARGE:                              CARDIAC CATHETERIZATION   CLINICAL HISTORY:  Mr. Interrante is 65 years old and is retired from [employer] and [employer].  He has a history of hyperlipidemia and a  family history of coronary heart disease.  He recently had developed chest  pain and was seen in consultation by Dr. Daleen Squibb.  He had a Cardiolite scan  which suggested inferior ischemia and he was scheduled for evaluation with  angiography.   PROCEDURE:  The procedure was performed in the outpatient laboratory.  The  procedure was performed by the right femoral artery using arterial sheath  and 4-French preformed coronary catheters.  A front wall arterial puncture  was performed and Omnipaque contrast was used.  Distal aortogram was  performed to rule out abdominal aortic aneurysm.  The patient tolerated the  procedure well and left the laboratory in satisfactory condition.   RESULTS:  Left main coronary artery:  Free of significant disease.   Left anterior descending artery:  Gave rise to two diagonal branches and a  septal perforator.  There were irregularities in the LAD and there was 30%  narrowing in the proximal LAD and 30% narrowing in the mid LAD.   Circumflex artery:  Gave rise to a ramus branch, an atrial branch, and a  posterolateral branch.  These vessels were free of significant disease.   Right coronary artery:  Moderate sized vessel.  Gave rise to a right  ventricular branch, a posterior descending branch, and a posterolateral  branch.  There was 40% narrowing in the mid right coronary artery.   LEFT VENTRICULOGRAM:  The left  ventriculogram performed in the RAO  projection showed good wall motion with no areas of hypokinesis.  The  estimated ejection fraction was 60%.   DISTAL AORTOGRAM:  A distal aortogram was performed which showed patent  renal arteries and no significant aortoiliac obstruction.   CONCLUSIONS:  Mild, non-obstructive coronary artery disease and normal left  ventricular function.   RECOMMENDATIONS:  Reassurance.  In view of these findings, I think the  patient's recent symptoms are not cardiac in etiology.  I do not think his  scan has significance and I think his scan represents a false positive test  possibly related to diaphragmatic attenuation.  Will plan reassurance and  plan secondary risk factor modification.  Charlies Constable, M.D. [facility]    BB/MEDQ  D:  12/21/2003  T:  12/22/2003  Job:  [id number]   cc:   Evelena Peat, M.D.  [address]  [place]  [place] [address]  Fax: [phone]   Jesse Sans. Wall, M.D.   [facility]

## 2010-09-28 NOTE — Discharge Summary (Signed)
NAME:  Donald Pope, Donald Pope NO.:  [id number]   MEDICAL RECORD NO.:  [id number]          PATIENT TYPE:  INP   LOCATION:  4735                         FACILITY:  [facility]   PHYSICIAN:  Salvadore Farber, M.D. LHCDATE OF BIRTH:  [birth year]   DATE OF ADMISSION:  12/14/2005  DATE OF DISCHARGE:  12/16/2005                                 DISCHARGE SUMMARY   PROCEDURES:  1. Cardiac catheterization.  2. Coronary arteriogram.  3. Left ventriculogram.   DISCHARGE DIAGNOSIS:  Chest pain:  Cardiac enzymes negative for myocardial  infarction and cardiac catheterization without obstructive disease.   SECONDARY DIAGNOSES:  1. Allergy or intolerance to codeine and niacin.  2. Status post cardiac catheterization in 2005 with nonobstructive      disease.  3. History of false-positive stress test suggestive of inferior ischemia.  4. Hyperlipidemia.  5. Obstructive sleep apnea on C-PAP.  6. Gastroesophageal reflux disease.  7. Possible benign prostatic hypertrophy.   DISCHARGE TIME:  Time of discharge 33 minutes.   HOSPITAL COURSE:  Mr. Zeiss is a 65 year old male with a previous history of  nonobstructive disease by cath in 2005.  He had chest pain that was  exertional.  He came to the emergency room where he was admitted for further  evaluation and treatment.   His cardiac enzymes were normal and his D-dimer was within normal limits as  well.  Because of the history of a false-positive stress test, it was felt  that a cardiac catheterization was indicated to further define his anatomy.  This was performed on December 16, 2005.   The cardiac catheterization showed nonobstructive disease of between 20 and  40% stenosis in the LAD, circumflex and RCA.  His EF was 65% with no  regional wall motion abnormalities and no AS or MR.  The films were  evaluated by Dr. Samule Ohm who felt that he had progression suspected of a  noncardiac etiology to chest pain.  A lipid profile was performed  that  showed a total cholesterol of 148, triglycerides 137, HDL 35, LDL 86.  He  was on Vytorin prior to admission and is to continue this.   Postcath, Mr. Safi's groin is oozing slightly.  Once the oozing has had  stopped and he is ambulating without any groin problems, he is tentatively  considered stable for discharge on December 16, 2005 with outpatient follow-up  arranged.   DISCHARGE INSTRUCTIONS:  His activity level is to be decreased for the next  two days.  He is to call our office for any problems with the cath site.  He  is to follow up with Dr. Daleen Squibb on January 10, 2006.  He is to follow up with  Dr. Caryl Never as needed.   DISCHARGE MEDICATIONS:  1. Prevacid 30 mg a day.  2. Fluticasone 50 mcg one puff a day.  3. Citalopram 10 mg daily.  4. Vytorin 10/20 mg daily.  5. Allegra 180 mg daily.  6. Aspirin 81 mg daily.  7. Multivitamin daily.  8. Glucosamine and chondroitin daily.      Rhonda Barrett, P.A.  [facility]      Salvadore Farber, M.D. [facility]  Electronically Signed    RB/MEDQ  D:  12/16/2005  T:  12/17/2005  Job:  [id number]   cc:   Evelena Peat, M.D.

## 2010-09-28 NOTE — Cardiovascular Report (Signed)
NAME:  Donald Pope, Donald Pope NO.:  [id number]   MEDICAL RECORD NO.:  [id number]          PATIENT TYPE:  INP   LOCATION:  4735                         FACILITY:  [facility]   PHYSICIAN:  Salvadore Farber, M.D. LHCDATE OF BIRTH:  29-Jan-1946   DATE OF PROCEDURE:  12/16/2005  DATE OF DISCHARGE:                              CARDIAC CATHETERIZATION   PROCEDURE:  Left heart catheterization, left ventriculography, coronary  angiography.   INDICATIONS:  Donald Pope is a 65 year old gentleman with a prior history of  nonobstructive coronary disease by cardiac catheterization in August 2005.  He presented to the hospital with several days of anterior chest heaviness  associated with fatigue.  Symptoms were not brought on by exertion.  Cardiac  enzymes were normal.  Electrocardiogram showed no evidence of acute  ischemia.  However, given his known disease and new symptoms, he was  referred for diagnostic angiography to exclude myocardial ischemia as a  cause of his discomfort.   PROCEDURAL TECHNIQUE:  Informed consent was obtained.  Under 1% lidocaine  local anesthesia, a 5-French sheath was placed in the right common femoral  artery using the modified Seldinger technique.  Diagnostic angiography and  ventriculography were performed using JL4, JR4, and pigtail catheters.  The  patient tolerated the procedure well and was transferred to the holding room  in stable condition.   COMPLICATIONS:  None.   FINDINGS:  1. LV:  119/4/9.  EF 65% without regional wall motion abnormality.  2. No aortic stenosis or mitral regurgitation.  3. Left main:  Angiographically normal.  4. LAD:  Moderate-sized vessel giving rise to two small diagonals.  The      proximal LAD has a 20% stenosis.  A septal has an 80% stenosis at its      ostium.  The mid LAD has a focal 30% stenosis.  5. Ramus intermedius:  Fairly small vessel which is angiographically      normal.  6. Circumflex:  Small vessel with 30%  stenosis in the mid vessel.  7. RCA:  Large, dominant vessel.  There is a calcified 30% stenosis of the      mid vessel.  There is a focal 40% stenosis approximately 20 mm prior to      the origin of the PDA.   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  I do not think  that this septal is the etiology of his chest discomfort.  Instead, I  suspect a noncardiac etiology to his chest pain.  Given his substantial  burden of atherosclerosis, aggressive secondary prevention should be  continued.      Salvadore Farber, M.D. [facility]  Electronically Signed     WED/MEDQ  D:  12/16/2005  T:  12/16/2005  Job:  [id number]   cc:   Evelena Peat, M.D.  Thomas C. Wall, M.D.

## 2010-10-12 ENCOUNTER — Telehealth: Payer: Self-pay | Admitting: *Deleted

## 2010-10-12 MED ORDER — CITALOPRAM HYDROBROMIDE 10 MG PO TABS: 10.0000 mg | ORAL_TABLET | Freq: Every day | ORAL | Status: DC

## 2010-10-12 NOTE — Telephone Encounter (Signed)
Received a refill request from [facility] for 90 day supply of Citalopram.  Pt has not been here since 6/11, so I will send a 30 day supply to his local pharmacy on record from last year, pt informed he needs return [facility]

## 2010-11-12 ENCOUNTER — Other Ambulatory Visit (INDEPENDENT_AMBULATORY_CARE_PROVIDER_SITE_OTHER): Payer: 59

## 2010-11-12 DIAGNOSIS — Z0000 Encounter for general adult medical examination without abnormal findings: Secondary | ICD-10-CM

## 2010-11-12 LAB — TSH: TSH: 3.15 u[IU]/mL (ref 0.35–5.50)

## 2010-11-12 LAB — BASIC METABOLIC PANEL
BUN: 18 mg/dL (ref 6–23)
CO2: 31 mEq/L (ref 19–32)
Calcium: 9.5 mg/dL (ref 8.4–10.5)
Chloride: 108 mEq/L (ref 96–112)
Creatinine, Ser: 1.1 mg/dL (ref 0.4–1.5)
GFR: 73.53 mL/min (ref >60.00)
Glucose, Bld: 103 mg/dL — ABNORMAL HIGH (ref 70–99)
Potassium: 4.7 mEq/L (ref 3.5–5.1)
Sodium: 145 mEq/L (ref 135–145)

## 2010-11-12 LAB — HEPATIC FUNCTION PANEL
ALT: 24 U/L (ref 0–53)
AST: 30 U/L (ref 0–37)
Albumin: 4.6 g/dL (ref 3.5–5.2)
Alkaline Phosphatase: 62 U/L (ref 39–117)
Bilirubin, Direct: 0.1 mg/dL (ref 0.0–0.3)
Total Bilirubin: 0.6 mg/dL (ref 0.3–1.2)
Total Protein: 7.3 g/dL (ref 6.0–8.3)

## 2010-11-12 LAB — LIPID PANEL
Cholesterol: 153 mg/dL (ref 0–200)
HDL: 36.2 mg/dL — ABNORMAL LOW (ref >39.00)
Total CHOL/HDL Ratio: 4
Triglycerides: 222 mg/dL — ABNORMAL HIGH (ref 0.0–149.0)
VLDL: 44.4 mg/dL — ABNORMAL HIGH (ref 0.0–40.0)

## 2010-11-12 LAB — CBC WITH DIFFERENTIAL/PLATELET
Basophils Absolute: 0 10*3/uL (ref 0.0–0.1)
Basophils Relative: 0.7 % (ref 0.0–3.0)
Eosinophils Absolute: 0.2 10*3/uL (ref 0.0–0.7)
Eosinophils Relative: 3.2 % (ref 0.0–5.0)
HCT: 41.5 % (ref 39.0–52.0)
Hemoglobin: 14.2 g/dL (ref 13.0–17.0)
Lymphocytes Relative: 33.6 % (ref 12.0–46.0)
Lymphs Abs: 1.9 10*3/uL (ref 0.7–4.0)
MCHC: 34.2 g/dL (ref 30.0–36.0)
MCV: 92.1 fl (ref 78.0–100.0)
Monocytes Absolute: 0.6 10*3/uL (ref 0.1–1.0)
Monocytes Relative: 11 % (ref 3.0–12.0)
Neutro Abs: 3 10*3/uL (ref 1.4–7.7)
Neutrophils Relative %: 51.5 % (ref 43.0–77.0)
Platelets: 117 10*3/uL — ABNORMAL LOW (ref 150.0–400.0)
RBC: 4.51 Mil/uL (ref 4.22–5.81)
RDW: 13.5 % (ref 11.5–14.6)
WBC: 5.8 10*3/uL (ref 4.5–10.5)

## 2010-11-12 LAB — LDL CHOLESTEROL, DIRECT: Direct LDL: 97.3 mg/dL

## 2010-11-12 LAB — POCT URINALYSIS DIPSTICK
Bilirubin, UA: NEGATIVE
Blood, UA: NEGATIVE
Glucose, UA: NEGATIVE
Ketones, UA: NEGATIVE
Leukocytes, UA: NEGATIVE
Nitrite, UA: NEGATIVE
Protein, UA: NEGATIVE
Spec Grav, UA: 1.03
Urobilinogen, UA: 0.2
pH, UA: 5.5

## 2010-11-12 LAB — PSA: PSA: 0.45 ng/mL (ref 0.10–4.00)

## 2010-11-13 ENCOUNTER — Ambulatory Visit (INDEPENDENT_AMBULATORY_CARE_PROVIDER_SITE_OTHER): Payer: 59 | Admitting: Family Medicine

## 2010-11-13 ENCOUNTER — Encounter: Payer: Self-pay | Admitting: Family Medicine

## 2010-11-13 VITALS — BP 130/72 | Temp 98.1°F | Ht 67.5 in | Wt 197.0 lb

## 2010-11-13 DIAGNOSIS — J209 Acute bronchitis, unspecified: Secondary | ICD-10-CM

## 2010-11-13 MED ORDER — BENZONATATE 200 MG PO CAPS: 200.0000 mg | ORAL_CAPSULE | Freq: Three times a day (TID) | ORAL | Status: AC | PRN

## 2010-11-13 NOTE — Progress Notes (Signed)
  Subjective:    Patient ID: Donald Pope, male    DOB: 13-Dec-1945, 65 y.o.   MRN: [id number]  HPI Patient is a nonsmoker with 3 to four-day history of dry cough. Does not feel particularly ill. No fever. No nasal congestive symptoms. No history of asthma. Not taking medications. No associated dyspnea. No pleuritic pain or hemoptysis.   Review of Systems  Constitutional: Negative for fever, chills, activity change, appetite change, fatigue and unexpected weight change.  HENT: Negative for ear pain, congestion, sore throat and trouble swallowing.   Respiratory: Positive for cough. Negative for shortness of breath, wheezing and stridor.   Cardiovascular: Negative for chest pain and leg swelling.  Gastrointestinal: Negative for abdominal pain.  Musculoskeletal: Negative for arthralgias.  Skin: Negative for rash.  Neurological: Negative for syncope and headaches.  Hematological: Negative for adenopathy.       Objective:   Physical Exam  Constitutional: He is oriented to person, place, and time. He appears well-developed and well-nourished. No distress.  HENT:  Head: Normocephalic and atraumatic.  Eyes: Pupils are equal, round, and reactive to light.  Neck: Normal range of motion. Neck supple. No thyromegaly present.  Cardiovascular: Normal rate and regular rhythm.   No murmur heard. Pulmonary/Chest: Effort normal. No stridor. No respiratory distress. He has no wheezes. He has no rales.  Abdominal: Soft. Bowel sounds are normal. There is no tenderness.  Musculoskeletal: He exhibits no edema.  Lymphadenopathy:    He has no cervical adenopathy.  Neurological: He is alert and oriented to person, place, and time.  Psychiatric: He has a normal mood and affect.          Assessment & Plan:  Cough secondary to probable acute viral bronchitis. Tessalon Perles for cough suppression 200 mg every 8 hours as needed follow up promptly for fever or worsening symptoms

## 2010-11-13 NOTE — Patient Instructions (Signed)
Follow up immediately for any fever or worsening symptoms.

## 2010-11-19 ENCOUNTER — Ambulatory Visit (INDEPENDENT_AMBULATORY_CARE_PROVIDER_SITE_OTHER): Payer: 59 | Admitting: Family Medicine

## 2010-11-19 ENCOUNTER — Encounter: Payer: Self-pay | Admitting: Family Medicine

## 2010-11-19 DIAGNOSIS — E785 Hyperlipidemia, unspecified: Secondary | ICD-10-CM

## 2010-11-19 DIAGNOSIS — F329 Major depressive disorder, single episode, unspecified: Secondary | ICD-10-CM

## 2010-11-19 DIAGNOSIS — IMO0001 Reserved for inherently not codable concepts without codable children: Secondary | ICD-10-CM

## 2010-11-19 DIAGNOSIS — Z23 Encounter for immunization: Secondary | ICD-10-CM

## 2010-11-19 DIAGNOSIS — Z299 Encounter for prophylactic measures, unspecified: Secondary | ICD-10-CM

## 2010-11-19 DIAGNOSIS — Z0000 Encounter for general adult medical examination without abnormal findings: Secondary | ICD-10-CM

## 2010-11-19 MED ORDER — PNEUMOCOCCAL VAC POLYVALENT 25 MCG/0.5ML IJ INJ: 0.5000 mL | INJECTION | Freq: Once | INTRAMUSCULAR | Status: DC

## 2010-11-19 MED ORDER — CITALOPRAM HYDROBROMIDE 10 MG PO TABS: 10.0000 mg | ORAL_TABLET | Freq: Every day | ORAL | Status: DC

## 2010-11-19 NOTE — Progress Notes (Signed)
Subjective:    Patient ID: Donald Pope, male    DOB: [birth year], 65 y.o.   MRN: [id number]  HPI Patient seen for complete physical examination-Medicare Wellness exam and medical follow up. Past medical history significant for hyperlipidemia, mild chronic thrombocytopenia, depression, obstructive sleep apnea, GERD, and CAD. Medications reviewed. Compliant with all. Cough slightly improved since last visit. No fever.  No confirmed history of Pneumovax. Colonoscopy about 9 years ago. Told to get ten year followup. Patient gets yearly flu vaccines. Prior history of shingles clinically.  Hyperlipidemia on Crestor.  Compliant with medication.  Occ myalgias legs and did not cease with d/c Crestor.   1.  Risk factors based on Past Medical , Social, and Family history  Hx as above.  Nonsmoker and drinks about 3 glasses wine per week. 2.  Limitations in physical activities  No limits.  Works part time and exercises with walking. 3.  Depression/mood  Hx depression stable. 4.  Hearing No hearing loss. 5.  ADLs  Fully independent in all. 6.  Cognitive function (orientation to time and place, language, writing, speech,memory) No short or long term memory deficits.  Judgement intact.  7.  Home Safety  No issues identified. 8.  Height, weight, and visual acuity.  Some gradual wt gain over the years.  Ht stable.  No vision changes. 9.  Counseling  Needs to work on weight loss. 10. Recommendation of preventive services.  Pneumovax and yearly flu vaccine. 11. Labs based on risk factors Lipids, hepatic, BMP. 12. Care Plan  As above.  Work on weight loss and monitor BP over the next few months.  Past Medical History  Diagnosis Date  . HYPERLIPIDEMIA 09/13/2008  . THROMBOCYTOPENIA 09/13/2008  . DEPRESSION 09/13/2008  . OBSTRUCTIVE SLEEP APNEA 10/23/2009  . CAD, NATIVE VESSEL 10/23/2009  . RBBB 10/26/2009  . GERD 09/13/2008   Past Surgical History  Procedure Date  . Tonsillectomy     reports that he has  never smoked. He does not have any smokeless tobacco history on file. His alcohol and drug histories not on file. family history includes COPD in his father; Heart disease in his father; Heart disease (age of onset:60) in his mother; Hyperlipidemia in his sister; and Hypertension in his sister. Allergies  Allergen Reactions  . Codeine Sulfate     REACTION: urination problems       Review of Systems  Constitutional: Negative for fever, activity change, appetite change and fatigue.  HENT: Negative for ear pain, congestion and trouble swallowing.   Eyes: Negative for pain and visual disturbance.  Respiratory: Negative for cough, shortness of breath and wheezing.   Cardiovascular: Negative for chest pain and palpitations.  Gastrointestinal: Negative for nausea, vomiting, abdominal pain, diarrhea, constipation, blood in stool, abdominal distention and rectal pain.  Genitourinary: Negative for dysuria, hematuria and testicular pain.  Musculoskeletal: Negative for joint swelling and arthralgias.  Skin: Negative for rash.  Neurological: Negative for dizziness, syncope and headaches.  Hematological: Negative for adenopathy.  Psychiatric/Behavioral: Negative for confusion and dysphoric mood.       Objective:   Physical Exam  Constitutional: He is oriented to person, place, and time. He appears well-developed and well-nourished. No distress.  HENT:  Head: Normocephalic and atraumatic.  Right Ear: External ear normal.  Left Ear: External ear normal.  Mouth/Throat: Oropharynx is clear and moist.  Eyes: Conjunctivae and EOM are normal. Pupils are equal, round, and reactive to light.  Neck: Normal range of motion. Neck supple. No thyromegaly  present.  Cardiovascular: Normal rate, regular rhythm and normal heart sounds.   No murmur heard. Pulmonary/Chest: No respiratory distress. He has no wheezes. He has no rales.  Abdominal: Soft. Bowel sounds are normal. He exhibits no distension and no  mass. There is no tenderness. There is no rebound and no guarding.  Musculoskeletal: He exhibits no edema.  Lymphadenopathy:    He has no cervical adenopathy.  Neurological: He is alert and oriented to person, place, and time. He displays normal reflexes. No cranial nerve deficit.  Skin: No rash noted.  Psychiatric: He has a normal mood and affect.          Assessment & Plan:  #1 complete physical. Pneumovax given. Hemoccults and will need repeat colonoscopy next year #2 history of depression stable refill of citalopram for one year #3 borderline elevated blood pressure. Recommend work on weight loss and reassess in 6 months #4 Hyperlipidemia.  Cont with Crestor.

## 2010-11-22 ENCOUNTER — Other Ambulatory Visit: Payer: Self-pay | Admitting: *Deleted

## 2010-11-22 NOTE — Telephone Encounter (Signed)
Opened in error

## 2010-11-23 ENCOUNTER — Telehealth: Payer: Self-pay | Admitting: *Deleted

## 2010-11-23 NOTE — Telephone Encounter (Signed)
Pharmacy [facility] called to confirm dose of Celexa, has 2 sets of instructions.  Confirmed should be one tab daily

## 2010-12-04 ENCOUNTER — Encounter: Payer: Self-pay | Admitting: Cardiology

## 2010-12-07 ENCOUNTER — Ambulatory Visit (INDEPENDENT_AMBULATORY_CARE_PROVIDER_SITE_OTHER): Payer: 59 | Admitting: Cardiology

## 2010-12-07 ENCOUNTER — Encounter: Payer: Self-pay | Admitting: Cardiology

## 2010-12-07 VITALS — BP 134/84 | HR 78 | Resp 16 | Ht 68.0 in | Wt 198.0 lb

## 2010-12-07 DIAGNOSIS — I4510 Unspecified right bundle-branch block: Secondary | ICD-10-CM

## 2010-12-07 DIAGNOSIS — I2510 Atherosclerotic heart disease of native coronary artery without angina pectoris: Secondary | ICD-10-CM

## 2010-12-07 NOTE — Assessment & Plan Note (Signed)
Stable. No change in treatment. 

## 2010-12-07 NOTE — Patient Instructions (Signed)
Your physician recommends that you schedule a follow-up appointment in: 1 year with Dr. Wall  

## 2010-12-07 NOTE — Progress Notes (Signed)
HPI [name] returns for the evaluation and management  Of his coronary artery disease.  He had nonobstructive disease by cardiac catheterization in 2007. He has normal left ventricular systolic function.  He has been sensitive to statins in the past with muscle aches. He is on 5 mg of Crestor. His last LDL was less than 100. His HDL runs in the 30s. His triglycerides were elevated more than usual. He admits to dietary indiscretion.  He said no angina or ischemic symptoms.  Meds reviewed. He seems compliant.  EKG shows normal sinus rhythm with a right bundle pattern in V1. Past Medical History  Diagnosis Date  . HYPERLIPIDEMIA 09/13/2008  . THROMBOCYTOPENIA 09/13/2008  . DEPRESSION 09/13/2008  . OBSTRUCTIVE SLEEP APNEA 10/23/2009  . CAD, NATIVE VESSEL 10/23/2009  . RBBB 10/26/2009  . GERD 09/13/2008    Past Surgical History  Procedure Date  . Tonsillectomy     Family History  Problem Relation Age of Onset  . Heart disease Mother 57    CHF  . Heart disease Father   . COPD Father   . Hypertension Sister   . Hyperlipidemia Sister     History   Social History  . Marital Status: Married    Spouse Name: N/A    Number of Children: N/A  . Years of Education: N/A   Occupational History  . Not on file.   Social History Main Topics  . Smoking status: Never Smoker   . Smokeless tobacco: Not on file  . Alcohol Use: Not on file  . Drug Use: Not on file  . Sexually Active: Not on file   Other Topics Concern  . Not on file   Social History Narrative  . No narrative on file    Allergies  Allergen Reactions  . Codeine Sulfate     REACTION: urination problems    Current Outpatient Prescriptions  Medication Sig Dispense Refill  . aspirin 81 MG EC tablet Take 81 mg by mouth daily.        . citalopram (CELEXA) 10 MG tablet Take 10 mg by mouth daily.        . fluticasone (FLONASE) 50 MCG/ACT nasal spray Place 2 sprays into the nose daily.        Marland Kitchen omeprazole (PRILOSEC) 20 MG  capsule One tab every other day       . rosuvastatin (CRESTOR) 5 MG tablet Take 5 mg by mouth daily.         Current Facility-Administered Medications  Medication Dose Route Frequency Provider Last Rate Last Dose  . pneumococcal 23 valent vaccine (PNU-IMMUNE) injection 0.5 mL  0.5 mL Intramuscular Once Bruce W Burchette        ROS Negative other than HPI.   PE General Appearance: well developed, well nourished in no acute distress HEENT: symmetrical face, PERRLA, good dentition  Neck: no JVD, thyromegaly, or adenopathy, trachea midline Chest: symmetric without deformity Cardiac: PMI non-displaced, RRR, normal S1, S2, no gallop or murmur Lung: clear to ausculation and percussion Vascular: all pulses full without bruits  Abdominal: nondistended, nontender, good bowel sounds, no HSM, no bruits Extremities: no cyanosis, clubbing or edema, no sign of DVT, no varicosities  Skin: normal color, no rashes Neuro: alert and oriented x 3, non-focal Pysch: normal affect Filed Vitals:   12/07/10 1140  BP: 134/84  Pulse: 78  Resp: 16  Height: 5\' 8"  (1.727 m)  Weight: 198 lb (89.812 kg)    EKG  Labs and Studies Reviewed.  Lab Results  Component Value Date   WBC 5.8 11/12/2010   HGB 14.2 11/12/2010   HCT 41.5 11/12/2010   MCV 92.1 11/12/2010   PLT 117.0* 11/12/2010      Chemistry      Component Value Date/Time   NA 145 11/12/2010 0934   K 4.7 11/12/2010 0934   CL 108 11/12/2010 0934   CO2 31 11/12/2010 0934   BUN 18 11/12/2010 0934   CREATININE 1.1 11/12/2010 0934      Component Value Date/Time   CALCIUM 9.5 11/12/2010 0934   ALKPHOS 62 11/12/2010 0934   AST 30 11/12/2010 0934   ALT 24 11/12/2010 0934   BILITOT 0.6 11/12/2010 0934       Lab Results  Component Value Date   CHOL 153 11/12/2010   CHOL 161 09/13/2009   CHOL 150 12/13/2008   Lab Results  Component Value Date   HDL 36.20* 11/12/2010   HDL 16.10* 09/13/2009   HDL 96.04* 12/13/2008   Lab Results  Component Value Date   LDLCALC 95  09/13/2009   LDLCALC 90 12/13/2008   LDLCALC 106* 09/01/2008   Lab Results  Component Value Date   TRIG 222.0* 11/12/2010   TRIG 163.0* 09/13/2009   TRIG 148.0 12/13/2008   Lab Results  Component Value Date   CHOLHDL 4 11/12/2010   CHOLHDL 5 09/13/2009   CHOLHDL 5 12/13/2008   No results found for this basename: HGBA1C   Lab Results  Component Value Date   ALT 24 11/12/2010   AST 30 11/12/2010   ALKPHOS 62 11/12/2010   BILITOT 0.6 11/12/2010   Lab Results  Component Value Date   TSH 3.15 11/12/2010

## 2010-12-07 NOTE — Assessment & Plan Note (Signed)
Stable

## 2010-12-24 ENCOUNTER — Other Ambulatory Visit: Payer: Self-pay | Admitting: *Deleted

## 2010-12-24 MED ORDER — ROSUVASTATIN CALCIUM 5 MG PO TABS: 5.0000 mg | ORAL_TABLET | Freq: Every day | ORAL | Status: DC

## 2011-05-30 ENCOUNTER — Ambulatory Visit (INDEPENDENT_AMBULATORY_CARE_PROVIDER_SITE_OTHER): Payer: 59 | Admitting: Family Medicine

## 2011-05-30 ENCOUNTER — Encounter: Payer: Self-pay | Admitting: Family Medicine

## 2011-05-30 VITALS — BP 138/80 | Temp 98.5°F | Wt 200.0 lb

## 2011-05-30 DIAGNOSIS — M546 Pain in thoracic spine: Secondary | ICD-10-CM

## 2011-05-30 MED ORDER — DIAZEPAM 5 MG PO TABS: ORAL_TABLET | ORAL | Status: DC

## 2011-05-30 NOTE — Patient Instructions (Signed)
Continue with heat, muscle massage, and walking/aerobic exercise as tolerated. Touch base by next week if no better

## 2011-05-30 NOTE — Progress Notes (Signed)
  Subjective:    Patient ID: Donald Pope, male    DOB: 10/24/1945, 66 y.o.   MRN: [id number]  HPI  Acute visit. Onset of right midthoracic back pain about one week ago. No specific injury but job requires heavy lifting at times. No spinal pain. Pain is a deep achy quality mild to moderate severity. Has tried Advil and muscle massage with some relief. Pain worse with twisting. No radiation anterior. No skin rash. Denies any cough, pleuritic pain, or dyspnea. He has sense of back spasms with episodes which are sporadic   Review of Systems  Constitutional: Negative for fever, chills, appetite change and unexpected weight change.  Respiratory: Negative for cough and shortness of breath.   Cardiovascular: Negative for chest pain, palpitations and leg swelling.  Gastrointestinal: Negative for abdominal pain.  Genitourinary: Negative for dysuria.  Musculoskeletal: Positive for back pain.  Hematological: Negative for adenopathy.       Objective:   Physical Exam  Constitutional: He appears well-developed and well-nourished.  Cardiovascular: Normal rate and regular rhythm.   Pulmonary/Chest: Effort normal and breath sounds normal. No respiratory distress. He has no wheezes. He has no rales.  Abdominal: Soft. There is no tenderness.  Musculoskeletal:       Minimal right parathoracic muscular tenderness. No spinal tenderness.  Neurological:       Full-strength upper extremities  Skin: No rash noted.          Assessment & Plan:  Right midthoracic muscular pain. Low-dose diazepam 5 mg each bedtime. Continue heat and muscle massage and aerobic activity as tolerated. Consider physical therapy if no better by next week

## 2011-08-30 ENCOUNTER — Other Ambulatory Visit: Payer: Self-pay | Admitting: *Deleted

## 2011-08-30 MED ORDER — DIAZEPAM 5 MG PO TABS: ORAL_TABLET | ORAL | Status: DC

## 2011-08-30 NOTE — Telephone Encounter (Signed)
Diazepam filled at [facility] 05/30/11, #20 with 0 refills (muscle spasm at HS)

## 2011-08-30 NOTE — Telephone Encounter (Signed)
Refill okay?  

## 2011-11-15 ENCOUNTER — Telehealth: Payer: Self-pay | Admitting: Family Medicine

## 2011-11-15 MED ORDER — ROSUVASTATIN CALCIUM 5 MG PO TABS: 5.0000 mg | ORAL_TABLET | Freq: Every day | ORAL | Status: DC

## 2011-11-15 MED ORDER — CITALOPRAM HYDROBROMIDE 10 MG PO TABS: 10.0000 mg | ORAL_TABLET | Freq: Every day | ORAL | Status: DC

## 2011-11-15 NOTE — Telephone Encounter (Signed)
Patient called stating that he need to pick up  a written 90 day rx for citalprolam and crestor so that he may send them off today as he will be going out of town. Please assist.

## 2011-11-15 NOTE — Telephone Encounter (Signed)
Pt informed Rx ready to pick up

## 2011-11-25 ENCOUNTER — Encounter: Payer: Self-pay | Admitting: *Deleted

## 2011-11-26 ENCOUNTER — Encounter: Payer: Self-pay | Admitting: Cardiology

## 2011-11-26 ENCOUNTER — Ambulatory Visit (INDEPENDENT_AMBULATORY_CARE_PROVIDER_SITE_OTHER): Payer: 59 | Admitting: Cardiology

## 2011-11-26 VITALS — BP 126/78 | HR 83 | Ht 68.0 in | Wt 195.0 lb

## 2011-11-26 DIAGNOSIS — I4510 Unspecified right bundle-branch block: Secondary | ICD-10-CM

## 2011-11-26 DIAGNOSIS — I2510 Atherosclerotic heart disease of native coronary artery without angina pectoris: Secondary | ICD-10-CM

## 2011-11-26 DIAGNOSIS — E785 Hyperlipidemia, unspecified: Secondary | ICD-10-CM

## 2011-11-26 DIAGNOSIS — G4733 Obstructive sleep apnea (adult) (pediatric): Secondary | ICD-10-CM

## 2011-11-26 NOTE — Progress Notes (Signed)
HPI Donald Pope returns for evaluation and management of his history of nonobstructive coronary disease by cath in 2007.  Has a history of false positive stress test showing inferior ischemia.  Has chronic right bundle branch block.  He is having no symptoms of angina or ischemic heart disease. He still has aching in his calves at night but denies any symptoms of claudication.  His blood work is checked by Dr. Caryl Never. He is on a statin for hyperlipidemia as well as an aspirin.  Past Medical History  Diagnosis Date  . HYPERLIPIDEMIA 09/13/2008  . THROMBOCYTOPENIA 09/13/2008  . DEPRESSION 09/13/2008  . OBSTRUCTIVE SLEEP APNEA 10/23/2009  . CAD, NATIVE VESSEL 10/23/2009  . RBBB 10/26/2009  . GERD 09/13/2008  . False positive stress test     history of, suggestive of inferior ischemia  . Benign prostatic hypertrophy     possible    Current Outpatient Prescriptions  Medication Sig Dispense Refill  . aspirin 81 MG EC tablet Take 81 mg by mouth daily.        . cetirizine (ZYRTEC) 10 MG tablet Take 10 mg by mouth daily.      . cholecalciferol (VITAMIN D) 1000 UNITS tablet Take 2,000 Units by mouth daily.      . citalopram (CELEXA) 10 MG tablet Take 1 tablet (10 mg total) by mouth daily.  90 tablet  0  . Coenzyme Q10 (CO Q-10) 300 MG CAPS Take by mouth daily.      . diazepam (VALIUM) 5 MG tablet One po qhs prn muscle spasm  20 tablet  0  . fluticasone (FLONASE) 50 MCG/ACT nasal spray Place 2 sprays into the nose daily.        . Multiple Vitamin (MULTIVITAMIN) tablet Take 1 tablet by mouth daily.      . rosuvastatin (CRESTOR) 5 MG tablet Take 1 tablet (5 mg total) by mouth daily.  90 tablet  0  . omeprazole (PRILOSEC) 20 MG capsule One tab every other day         Allergies  Allergen Reactions  . Codeine Sulfate     REACTION: urination problems    Family History  Problem Relation Age of Onset  . Heart disease Mother 38    CHF  . Heart disease Father   . COPD Father   . Hypertension  Sister   . Hyperlipidemia Sister     History   Social History  . Marital Status: Married    Spouse Name: N/A    Number of Children: N/A  . Years of Education: N/A   Occupational History  . Not on file.   Social History Main Topics  . Smoking status: Never Smoker   . Smokeless tobacco: Not on file  . Alcohol Use: Not on file  . Drug Use: Not on file  . Sexually Active: Not on file   Other Topics Concern  . Not on file   Social History Narrative  . No narrative on file    ROS ALL NEGATIVE EXCEPT THOSE NOTED IN HPI  PE  General Appearance: well developed, well nourished in no acute distress, overweight HEENT: symmetrical face, PERRLA, good dentition  Neck: no JVD, thyromegaly, or adenopathy, trachea midline Chest: symmetric without deformity Cardiac: PMI non-displaced, RRR, normal S1, S2, no gallop or murmur Lung: clear to ausculation and percussion Vascular: all pulses full without bruits  Abdominal: nondistended, nontender, good bowel sounds, no HSM, no bruits Extremities: no cyanosis, clubbing or edema, no sign of DVT, no  varicosities  Skin: normal color, no rashes Neuro: alert and oriented x 3, non-focal Pysch: normal affect  EKG Normal sinus rhythm, right bundle branch block. BMET    Component Value Date/Time   NA 145 11/12/2010 0934   K 4.7 11/12/2010 0934   CL 108 11/12/2010 0934   CO2 31 11/12/2010 0934   GLUCOSE 103* 11/12/2010 0934   BUN 18 11/12/2010 0934   CREATININE 1.1 11/12/2010 0934   CALCIUM 9.5 11/12/2010 0934   GFRNONAA 79.80 09/13/2009 0903    Lipid Panel     Component Value Date/Time   CHOL 153 11/12/2010 0934   TRIG 222.0* 11/12/2010 0934   HDL 36.20* 11/12/2010 0934   CHOLHDL 4 11/12/2010 0934   VLDL 44.4* 11/12/2010 0934   LDLCALC 95 09/13/2009 0903    CBC    Component Value Date/Time   WBC 5.8 11/12/2010 0934   RBC 4.51 11/12/2010 0934   HGB 14.2 11/12/2010 0934   HCT 41.5 11/12/2010 0934   PLT 117.0* 11/12/2010 0934   MCV 92.1 11/12/2010 0934   MCHC 34.2  11/12/2010 0934   RDW 13.5 11/12/2010 0934   LYMPHSABS 1.9 11/12/2010 0934   MONOABS 0.6 11/12/2010 0934   EOSABS 0.2 11/12/2010 0934   BASOSABS 0.0 11/12/2010 0934

## 2011-11-26 NOTE — Assessment & Plan Note (Signed)
Stable. Continue secondary preventative therapy. 

## 2011-11-26 NOTE — Patient Instructions (Addendum)
Your physician wants you to follow-up in: 12 months with Dr. Daleen Squibb.  You will receive a reminder letter in the mail two months in advance. If you don't receive a letter, please call our office to schedule the follow-up appointment.

## 2011-12-23 ENCOUNTER — Encounter: Payer: Self-pay | Admitting: Gastroenterology

## 2011-12-24 ENCOUNTER — Other Ambulatory Visit (INDEPENDENT_AMBULATORY_CARE_PROVIDER_SITE_OTHER): Payer: 59

## 2011-12-24 DIAGNOSIS — Z0000 Encounter for general adult medical examination without abnormal findings: Secondary | ICD-10-CM

## 2011-12-24 LAB — CBC WITH DIFFERENTIAL/PLATELET
Basophils Absolute: 0 10*3/uL (ref 0.0–0.1)
Basophils Relative: 0.6 % (ref 0.0–3.0)
Eosinophils Absolute: 0.1 10*3/uL (ref 0.0–0.7)
Eosinophils Relative: 0.8 % (ref 0.0–5.0)
HCT: 43 % (ref 39.0–52.0)
Hemoglobin: 14.3 g/dL (ref 13.0–17.0)
Lymphocytes Relative: 27.8 % (ref 12.0–46.0)
Lymphs Abs: 1.8 10*3/uL (ref 0.7–4.0)
MCHC: 33.3 g/dL (ref 30.0–36.0)
MCV: 92.4 fl (ref 78.0–100.0)
Monocytes Absolute: 0.5 10*3/uL (ref 0.1–1.0)
Monocytes Relative: 8.1 % (ref 3.0–12.0)
Neutro Abs: 4 10*3/uL (ref 1.4–7.7)
Neutrophils Relative %: 62.7 % (ref 43.0–77.0)
Platelets: 124 10*3/uL — ABNORMAL LOW (ref 150.0–400.0)
RBC: 4.66 Mil/uL (ref 4.22–5.81)
RDW: 13.5 % (ref 11.5–14.6)
WBC: 6.4 10*3/uL (ref 4.5–10.5)

## 2011-12-24 LAB — LIPID PANEL
Cholesterol: 135 mg/dL (ref 0–200)
HDL: 35.4 mg/dL — ABNORMAL LOW (ref >39.00)
LDL Cholesterol: 71 mg/dL (ref 0–99)
Total CHOL/HDL Ratio: 4
Triglycerides: 141 mg/dL (ref 0.0–149.0)
VLDL: 28.2 mg/dL (ref 0.0–40.0)

## 2011-12-24 LAB — POCT URINALYSIS DIPSTICK
Bilirubin, UA: NEGATIVE
Blood, UA: NEGATIVE
Glucose, UA: NEGATIVE
Ketones, UA: NEGATIVE
Leukocytes, UA: NEGATIVE
Nitrite, UA: NEGATIVE
Protein, UA: NEGATIVE
Spec Grav, UA: >=1.03
Urobilinogen, UA: 0.2
pH, UA: 5.5

## 2011-12-24 LAB — HEPATIC FUNCTION PANEL
ALT: 21 U/L (ref 0–53)
AST: 26 U/L (ref 0–37)
Albumin: 4.6 g/dL (ref 3.5–5.2)
Alkaline Phosphatase: 55 U/L (ref 39–117)
Bilirubin, Direct: 0.1 mg/dL (ref 0.0–0.3)
Total Bilirubin: 0.4 mg/dL (ref 0.3–1.2)
Total Protein: 7.7 g/dL (ref 6.0–8.3)

## 2011-12-24 LAB — BASIC METABOLIC PANEL
BUN: 20 mg/dL (ref 6–23)
CO2: 25 mEq/L (ref 19–32)
Calcium: 9.3 mg/dL (ref 8.4–10.5)
Chloride: 102 mEq/L (ref 96–112)
Creatinine, Ser: 1.1 mg/dL (ref 0.4–1.5)
GFR: 74.08 mL/min (ref >60.00)
Glucose, Bld: 112 mg/dL — ABNORMAL HIGH (ref 70–99)
Potassium: 4.4 mEq/L (ref 3.5–5.1)
Sodium: 138 mEq/L (ref 135–145)

## 2011-12-24 LAB — TSH: TSH: 2.27 u[IU]/mL (ref 0.35–5.50)

## 2011-12-24 LAB — PSA: PSA: 0.6 ng/mL (ref 0.10–4.00)

## 2012-01-01 ENCOUNTER — Encounter: Payer: Self-pay | Admitting: Family Medicine

## 2012-01-01 ENCOUNTER — Ambulatory Visit (INDEPENDENT_AMBULATORY_CARE_PROVIDER_SITE_OTHER): Payer: 59 | Admitting: Family Medicine

## 2012-01-01 VITALS — BP 124/78 | Temp 98.3°F | Ht 67.25 in | Wt 193.0 lb

## 2012-01-01 DIAGNOSIS — Z0000 Encounter for general adult medical examination without abnormal findings: Secondary | ICD-10-CM

## 2012-01-01 DIAGNOSIS — E785 Hyperlipidemia, unspecified: Secondary | ICD-10-CM

## 2012-01-01 MED ORDER — PRAVASTATIN SODIUM 20 MG PO TABS: 20.0000 mg | ORAL_TABLET | Freq: Every day | ORAL | Status: DC

## 2012-01-01 NOTE — Progress Notes (Signed)
Subjective:    Patient ID: Donald Pope, male    DOB: 11-09-1945, 66 y.o.   MRN: [id number]  HPI  Patient seen for complete physical. He has history of CAD, hyperlipidemia, depression, GERD, obstructive sleep apnea, and chronic mild thrombocytopenia. Immunizations up-to-date. No history of shingles vaccine clinical shingles about 10 years ago. Colonoscopy scheduled for next month.  Patient's compliant with medications. Blood pressures been well controlled. Depression stable. Had some bilateral leg pain mostly at night. No cramps. No restless leg symptoms. Works on [employer] on hard floors all day. Patient is considered whether this may be statin related. Takes Crestor 5 mg daily. Once previously, he held medication for one month and did not see change. He is requesting consideration for change in statin.  Has previously taken Lipitor and simvastatin but had leg pain.  Has some occasional nocturia and slightly slow stream recently. No burning with urination. Current PSA normal  Past Medical History  Diagnosis Date  . HYPERLIPIDEMIA 09/13/2008  . THROMBOCYTOPENIA 09/13/2008  . DEPRESSION 09/13/2008  . OBSTRUCTIVE SLEEP APNEA 10/23/2009  . CAD, NATIVE VESSEL 10/23/2009  . RBBB 10/26/2009  . GERD 09/13/2008  . False positive stress test     history of, suggestive of inferior ischemia  . Benign prostatic hypertrophy     possible   Past Surgical History  Procedure Date  . Tonsillectomy     reports that he has never smoked. He does not have any smokeless tobacco history on file. His alcohol and drug histories not on file. family history includes COPD in his father; Heart disease in his father; Heart disease (age of onset:60) in his mother; Hyperlipidemia in his sister; and Hypertension in his sister. Allergies  Allergen Reactions  . Codeine Sulfate     REACTION: urination problems      Review of Systems  Constitutional: Negative for fever, activity change, appetite change, fatigue and unexpected  weight change.  HENT: Negative for ear pain, congestion and trouble swallowing.   Eyes: Negative for pain and visual disturbance.  Respiratory: Negative for cough, shortness of breath and wheezing.   Cardiovascular: Negative for chest pain and palpitations.  Gastrointestinal: Negative for nausea, vomiting, abdominal pain, diarrhea, constipation, blood in stool, abdominal distention and rectal pain.  Genitourinary: Positive for frequency and decreased urine volume. Negative for dysuria, hematuria and testicular pain.  Musculoskeletal: Positive for myalgias (Bilateral, mostly nocturnal leg pain). Negative for joint swelling and arthralgias.  Skin: Negative for rash.  Neurological: Negative for dizziness, syncope and headaches.  Hematological: Negative for adenopathy.  Psychiatric/Behavioral: Negative for confusion and dysphoric mood.       Objective:   Physical Exam  Constitutional: He is oriented to person, place, and time. He appears well-developed and well-nourished. No distress.  HENT:  Head: Normocephalic and atraumatic.  Right Ear: External ear normal.  Left Ear: External ear normal.  Mouth/Throat: Oropharynx is clear and moist.  Eyes: Conjunctivae and EOM are normal. Pupils are equal, round, and reactive to light.  Neck: Normal range of motion. Neck supple. No thyromegaly present.  Cardiovascular: Normal rate, regular rhythm and normal heart sounds.   No murmur heard. Pulmonary/Chest: No respiratory distress. He has no wheezes. He has no rales.  Abdominal: Soft. Bowel sounds are normal. He exhibits no distension and no mass. There is no tenderness. There is no rebound and no guarding.  Genitourinary: Rectum normal.       Prostate minimally enlarged but nontender with no nodules  Musculoskeletal: He exhibits no edema.  Lymphadenopathy:    He has no cervical adenopathy.  Neurological: He is alert and oriented to person, place, and time. He displays normal reflexes. No cranial  nerve deficit.  Skin: No rash noted.  Psychiatric: He has a normal mood and affect.          Assessment & Plan:  Health maintenance. Check on coverage for shingles vaccine. Colonoscopy has been scheduled. Bilateral leg pain possibly statin related. Patient request change in statin. Discontinue Crestor. Pravastatin 20 mg daily and repeat lipid and hepatic in 8 weeks

## 2012-01-01 NOTE — Patient Instructions (Addendum)
Check on insurance coverage for shingles vaccine. 

## 2012-01-16 ENCOUNTER — Ambulatory Visit (AMBULATORY_SURGERY_CENTER): Payer: 59 | Admitting: *Deleted

## 2012-01-16 VITALS — Ht 67.0 in | Wt 197.4 lb

## 2012-01-16 DIAGNOSIS — Z1211 Encounter for screening for malignant neoplasm of colon: Secondary | ICD-10-CM

## 2012-01-16 MED ORDER — MOVIPREP 100 G PO SOLR: ORAL | Status: DC

## 2012-01-20 ENCOUNTER — Encounter: Payer: Self-pay | Admitting: Gastroenterology

## 2012-01-27 ENCOUNTER — Encounter: Payer: Self-pay | Admitting: Gastroenterology

## 2012-01-27 ENCOUNTER — Ambulatory Visit (AMBULATORY_SURGERY_CENTER): Payer: 59 | Admitting: Gastroenterology

## 2012-01-27 VITALS — BP 153/75 | HR 73 | Temp 98.0°F | Resp 20 | Ht 67.0 in | Wt 197.0 lb

## 2012-01-27 DIAGNOSIS — Z1211 Encounter for screening for malignant neoplasm of colon: Secondary | ICD-10-CM

## 2012-01-27 DIAGNOSIS — D126 Benign neoplasm of colon, unspecified: Secondary | ICD-10-CM

## 2012-01-27 MED ORDER — SODIUM CHLORIDE 0.9 % IV SOLN: 500.0000 mL | INTRAVENOUS | Status: DC

## 2012-01-27 NOTE — Op Note (Signed)
[facility] [address] [place], [address]   COLONOSCOPY PROCEDURE REPORT  PATIENT: Sherill, Wegener  MR#: [id number] BIRTHDATE: Apr 14, 1946 , 66  yrs. old GENDER: Male ENDOSCOPIST: Mardella Layman, MD, [facility] REFERRED BY: PROCEDURE DATE:  01/27/2012 PROCEDURE:   Colonoscopy with snare polypectomy ASA CLASS:   Class II INDICATIONS:average risk patient for colon cancer. MEDICATIONS: propofol (Diprivan) 300mg  IV  DESCRIPTION OF PROCEDURE:   After the risks and benefits and of the procedure were explained, informed consent was obtained.  A digital rectal exam revealed no abnormalities of the rectum.    The LB [id number]  endoscope was introduced through the anus and advanced to the cecum, which was identified by both the appendix and ileocecal valve .  The quality of the prep was poor, using MoviPrep .  The instrument was then slowly withdrawn as the colon was fully examined.     COLON FINDINGS: A firm sessile polyp ranging between 3-55mm in size was found at the cecum.  A polypectomy was performed.  A polypectomy was performed using snare cautery.  The resection was complete and the polyp tissue was completely retrieved.   The colon mucosa was otherwise normal. The snare  seemed to entrap and more cauterty than usual needed for removal.   Retroflexed views revealed no abnormalities.     The scope was then withdrawn from the patient and the procedure completed.  COMPLICATIONS: There were no complications. ENDOSCOPIC IMPRESSION: 1.   Sessile polyp ranging between 3-64mm in size was found at the cecum; polypectomy was performed; polypectomy was performed using snare cautery 2.   The colon mucosa was otherwise normal  RECOMMENDATIONS: 1.  clear liquids for 24h,watch for signs of perforation. 2.  await biopsy results   REPEAT EXAM:  cc:  _______________________________ eSignedMardella Layman, MD, [facility] 01/27/2012 12:36 PM     PATIENT  NAME:  Josiah, Nieto MR#: [id number]

## 2012-01-27 NOTE — Patient Instructions (Addendum)
YOU HAD AN ENDOSCOPIC PROCEDURE TODAY AT THE [facility]: Refer to the procedure report that was given to you for any specific questions about what was found during the examination.  If the procedure report does not answer your questions, please call your gastroenterologist to clarify.  If you requested that your care partner not be given the details of your procedure findings, then the procedure report has been included in a sealed envelope for you to review at your convenience later.  YOU SHOULD EXPECT: Some feelings of bloating in the abdomen. Passage of more gas than usual.  Walking can help get rid of the air that was put into your GI tract during the procedure and reduce the bloating. If you had a lower endoscopy (such as a colonoscopy or flexible sigmoidoscopy) you may notice spotting of blood in your stool or on the toilet paper. If you underwent a bowel prep for your procedure, then you may not have a normal bowel movement for a few days.  ACTIVITY: Your care partner should take you home directly after the procedure.  You should plan to take it easy, moving slowly for the rest of the day.  You can resume normal activity the day after the procedure however you should NOT DRIVE or use heavy machinery for 24 hours (because of the sedation medicines used during the test).    SYMPTOMS TO REPORT IMMEDIATELY: A gastroenterologist can be reached at any hour.  During normal business hours, 8:30 AM to 5:00 PM Monday through Friday, call [phone].  After hours and on weekends, please call the GI answering service at [phone] who will take a message and have the physician on call contact you.   Following lower endoscopy (colonoscopy or flexible sigmoidoscopy):  Excessive amounts of blood in the stool  Significant tenderness or worsening of abdominal pains  Swelling of the abdomen that is new, acute  Fever of 100F or higher  FOLLOW UP: If any biopsies were taken you will be  contacted by phone or by letter within the next 1-3 weeks.  Call your gastroenterologist if you have not heard about the biopsies in 3 weeks.  Our staff will call the home number listed on your records the next business day following your procedure to check on you and address any questions or concerns that you may have at that time regarding the information given to you following your procedure. This is a courtesy call and so if there is no answer at the home number and we have not heard from you through the emergency physician on call, we will assume that you have returned to your regular daily activities without incident.  SIGNATURES/CONFIDENTIALITY: You and/or your care partner have signed paperwork which will be entered into your electronic medical record.  These signatures attest to the fact that that the information above on your After Visit Summary has been reviewed and is understood.  Full responsibility of the confidentiality of this discharge information lies with you and/or your care-partner.    Diet:   Stay on a liquid diet for today due to the large polyp removed.   It was quite deep.   Do call us if the patient has ANY PAIN.  Report to Dr. Jarold Motto tomorrow morning before starting a regular diet.  Thank you for choosing [place] for your healthcare needs.

## 2012-01-27 NOTE — Progress Notes (Signed)
Patient did not experience any of the following events: a burn prior to discharge; a fall within the facility; wrong site/side/patient/procedure/implant event; or a hospital transfer or hospital admission upon discharge from the facility. (G8907) Patient did not have preoperative order for IV antibiotic SSI prophylaxis. (G8918)  

## 2012-01-28 ENCOUNTER — Telehealth: Payer: Self-pay | Admitting: *Deleted

## 2012-01-28 ENCOUNTER — Telehealth: Payer: Self-pay | Admitting: Gastroenterology

## 2012-01-28 NOTE — Telephone Encounter (Signed)
I spoke with Donald Pope today, he is having no nominal pain, other GI symptomatology. I advised slow enhancement in his diet with cautious observation per his recent colonoscopy and polypectomy. He is to call immediately if he has new problems.

## 2012-01-28 NOTE — Telephone Encounter (Signed)
  Follow up Call-  Call back number 01/27/2012  Post procedure Call Back phone  # [phone]  Permission to leave phone message Yes     Patient questions:  Do you have a fever, pain , or abdominal swelling? no Pain Score  0 *  Have you tolerated food without any problems? no  Have you been able to return to your normal activities? yes  Do you have any questions about your discharge instructions: Diet   no Medications  no Follow up visit  no  Do you have questions or concerns about your Care? no  Actions: * If pain score is 4 or above: No action needed, pain <4. pts instructions stated clear liquids x 24 hours to monitor for perforation. Wife states pt has had no bleeding, no abd pain, no problems of any kind and after 24 hours will resume normal diet and activity. Instructed pts wife to call with any problems or concerns. ewm

## 2012-01-31 ENCOUNTER — Encounter: Payer: Self-pay | Admitting: Gastroenterology

## 2012-03-02 ENCOUNTER — Other Ambulatory Visit (INDEPENDENT_AMBULATORY_CARE_PROVIDER_SITE_OTHER): Payer: 59

## 2012-03-02 DIAGNOSIS — E785 Hyperlipidemia, unspecified: Secondary | ICD-10-CM

## 2012-03-02 LAB — LIPID PANEL
Cholesterol: 188 mg/dL (ref 0–200)
HDL: 33.7 mg/dL — ABNORMAL LOW (ref >39.00)
Total CHOL/HDL Ratio: 6
Triglycerides: 277 mg/dL — ABNORMAL HIGH (ref 0.0–149.0)
VLDL: 55.4 mg/dL — ABNORMAL HIGH (ref 0.0–40.0)

## 2012-03-02 LAB — HEPATIC FUNCTION PANEL
ALT: 23 U/L (ref 0–53)
AST: 26 U/L (ref 0–37)
Albumin: 4.2 g/dL (ref 3.5–5.2)
Alkaline Phosphatase: 52 U/L (ref 39–117)
Bilirubin, Direct: 0.1 mg/dL (ref 0.0–0.3)
Total Bilirubin: 0.8 mg/dL (ref 0.3–1.2)
Total Protein: 7.6 g/dL (ref 6.0–8.3)

## 2012-03-02 LAB — LDL CHOLESTEROL, DIRECT: Direct LDL: 105.8 mg/dL

## 2012-03-03 NOTE — Progress Notes (Signed)
Quick Note:  Pt informed on VM ______ 

## 2012-03-10 ENCOUNTER — Other Ambulatory Visit: Payer: Self-pay | Admitting: *Deleted

## 2012-03-10 ENCOUNTER — Telehealth: Payer: Self-pay | Admitting: Family Medicine

## 2012-03-10 MED ORDER — CITALOPRAM HYDROBROMIDE 10 MG PO TABS: 10.0000 mg | ORAL_TABLET | Freq: Every day | ORAL | Status: DC

## 2012-03-10 NOTE — Telephone Encounter (Signed)
Pt called and said that he needs to get 90 day supply for citalopram (CELEXA) 10 MG tablet and pravastatin (PRAVACHOL) 20 MG tablet  Pt wants to pick up written script for both meds. Pls call when ready for pick up.

## 2012-03-11 MED ORDER — CITALOPRAM HYDROBROMIDE 10 MG PO TABS: 10.0000 mg | ORAL_TABLET | Freq: Every day | ORAL | Status: DC

## 2012-03-11 MED ORDER — PRAVASTATIN SODIUM 20 MG PO TABS: 20.0000 mg | ORAL_TABLET | Freq: Every day | ORAL | Status: DC

## 2012-03-11 NOTE — Telephone Encounter (Signed)
Informed Rx ready for pick up

## 2012-03-12 ENCOUNTER — Telehealth: Payer: Self-pay | Admitting: Family Medicine

## 2012-03-12 NOTE — Telephone Encounter (Signed)
Diazepam last filled 08-30-11, #20 with 0 refills

## 2012-03-12 NOTE — Telephone Encounter (Signed)
Refill once 

## 2012-03-12 NOTE — Telephone Encounter (Signed)
Caller: Hayzen/Patient; Phone: [phone]; Reason for Call: Patient requesting a Rx for Diazepam and would like it called in so he can pick up with his other Rx around noon.

## 2012-03-13 MED ORDER — DIAZEPAM 5 MG PO TABS: ORAL_TABLET | ORAL | Status: DC

## 2012-03-13 NOTE — Telephone Encounter (Signed)
Pt informed Rx callled into [facility]

## 2012-03-24 ENCOUNTER — Ambulatory Visit (INDEPENDENT_AMBULATORY_CARE_PROVIDER_SITE_OTHER): Payer: 59 | Admitting: Family Medicine

## 2012-03-24 ENCOUNTER — Encounter: Payer: Self-pay | Admitting: Family Medicine

## 2012-03-24 VITALS — BP 118/80 | Temp 97.8°F | Wt 194.0 lb

## 2012-03-24 DIAGNOSIS — M5416 Radiculopathy, lumbar region: Secondary | ICD-10-CM

## 2012-03-24 DIAGNOSIS — IMO0002 Reserved for concepts with insufficient information to code with codable children: Secondary | ICD-10-CM

## 2012-03-24 NOTE — Progress Notes (Signed)
Subjective:     Patient ID: Donald Pope, male   DOB: Oct 06, 1945, 66 y.o.   MRN: [id number]  HPI 66 year old male with history of hyperlipidemia and CAD here for evaluation of R LE pain.  States that it has been an ongoing issue for 2-3 years, and has tried various remedies without much relief.  Trial off statin and changing statin have not helped.  Describes pain in R calf as like stretching a tight muscle.  He denies that being on his feet for prolonged periods incites or exacerbates pain, and denies any LE edema, warmth or erythema.  Does not feel any tingling or experience loss of sensation.  Raising feet in bed provides some relief, but lately has needed diazepam on a PRN basis to help sleep due to discomfort.  Would like add'l consultation at this time, perhaps with neurologist.  Review of Systems  Musculoskeletal: Positive for myalgias and arthralgias. Negative for back pain, joint swelling and gait problem.  Neurological: Negative for weakness and numbness.       Objective:   Physical Exam  Constitutional: He is oriented to person, place, and time. He appears well-developed and well-nourished. No distress.  Musculoskeletal: Normal range of motion. He exhibits no tenderness.       Pain in R hip when flexed and adducted. Pt notes R calf pain when he has been lying flat for extended time. No swelling, warmth or erythema in either calf. LE strength 5/5 bilaterally.  Neurological: He is alert and oriented to person, place, and time. He displays normal reflexes. He exhibits normal muscle tone. Coordination normal.       No focal neurological deficits.       Assessment:     66 year old here for evaluation of R LE pain.    Plan:     1. R LE pain: suspect radiculopathy.  No concern for claudication, or focal neurological deficits.  Likely not statin-related, as it is not bilateral, and a trial off statin and changing statins has not provided relief.  Recommend plain films of lumbar  spine and referral to orthopedics or neurosurgery.  At this point, pt preference is referral to neurology, specifically Dr. Anne Hahn.  Explained to pt that this might delay care, with wait time for appointment, and that Dr. Anne Hahn likely would initially get x-ray anyway.  Pt expressed understanding, and indicated that this is how he would like to proceed.  Referral request placed.  Pt will follow-up if wait time for appointment with Dr. Anne Hahn is not workable for him.  Donald Pope, MS3    Agree with assessment and plan as per Donald Schiller, MS 3 As above, warrants further evaluation at this time.  Offered getting some x-rays first and pt requesting direct referral to neurology.  Straight leg raises negative and no focal neurologic deficits. NO claudication symptoms and good distal pulses and pain actually worse at night. Donald Peat MD

## 2012-04-17 DIAGNOSIS — M79609 Pain in unspecified limb: Secondary | ICD-10-CM | POA: Insufficient documentation

## 2012-07-04 ENCOUNTER — Encounter: Payer: Self-pay | Admitting: Neurology

## 2012-07-04 DIAGNOSIS — M79609 Pain in unspecified limb: Secondary | ICD-10-CM

## 2012-08-03 ENCOUNTER — Other Ambulatory Visit: Payer: Self-pay | Admitting: Neurology

## 2012-08-18 ENCOUNTER — Encounter: Payer: Self-pay | Admitting: Neurology

## 2012-08-18 ENCOUNTER — Ambulatory Visit (INDEPENDENT_AMBULATORY_CARE_PROVIDER_SITE_OTHER): Payer: Medicare Other | Admitting: Neurology

## 2012-08-18 VITALS — BP 144/91 | HR 78 | Ht 68.75 in | Wt 192.0 lb

## 2012-08-18 DIAGNOSIS — M48061 Spinal stenosis, lumbar region without neurogenic claudication: Secondary | ICD-10-CM

## 2012-08-18 DIAGNOSIS — M79609 Pain in unspecified limb: Secondary | ICD-10-CM

## 2012-08-18 HISTORY — DX: Spinal stenosis, lumbar region without neurogenic claudication: M48.061

## 2012-08-18 MED ORDER — GABAPENTIN 600 MG PO TABS: 900.0000 mg | ORAL_TABLET | Freq: Every day | ORAL | Status: DC

## 2012-08-18 NOTE — Progress Notes (Signed)
Reason for visit: Leg pain  Donald Pope is an 67 y.o. male  History of present illness:  Donald Pope is a 67 year old right-handed white male with a history of leg pain that has been present for approximately 3 years. The patient does have some discomfort in the back, and he has some pain into the hips, right greater than left. The patient however, mainly has pain below the knees bilaterally that is worse at night when he is trying to sleep, better during the day when he is active. 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 The patient denies that he has pseudo-claudication symptoms. The patient has had a normal examination previously with normal reflexes and strength in the legs. The patient does have some urinary hesitancy, but otherwise there is no problem with control of the bowels or the bladder. The patient returns for an evaluation. Gabapentin has helped, but the pain control has been incomplete. The patient is on 600 mg at night of gabapentin. The patient returns for an evaluation.  Past Medical History  Diagnosis Date  . HYPERLIPIDEMIA 09/13/2008  . THROMBOCYTOPENIA 09/13/2008  . DEPRESSION 09/13/2008  . OBSTRUCTIVE SLEEP APNEA 10/23/2009    cpap  . CAD, NATIVE VESSEL 10/23/2009  . RBBB 10/26/2009  . GERD 09/13/2008  . False positive stress test     history of, suggestive of inferior ischemia  . Benign prostatic hypertrophy     possible  . Spinal stenosis of lumbar region 08/18/2012    Past Surgical History  Procedure Laterality Date  . Tonsillectomy    . Nasal polyp excision      Family History  Problem Relation Age of Onset  . Heart disease Mother 32    CHF  . Heart disease Father   . COPD Father   . Hypertension Sister   . Hyperlipidemia Sister   . Colon cancer Neg Hx   . Stomach cancer Neg Hx     Social history:  reports that he has never smoked. He has never used smokeless tobacco. He reports  that he drinks about 3.0 ounces of alcohol per week. He reports that he does not use illicit drugs.  Allergies:  Allergies  Allergen Reactions  . Codeine Sulfate     REACTION: urination problems    Medications:  Current Outpatient Prescriptions on File Prior to Visit  Medication Sig Dispense Refill  . aspirin 81 MG EC tablet Take 81 mg by mouth daily.        . cetirizine (ZYRTEC) 10 MG tablet Take 10 mg by mouth daily.      . cholecalciferol (VITAMIN D) 1000 UNITS tablet Take 2,000 Units by mouth daily.      . citalopram (CELEXA) 10 MG tablet Take 1 tablet (10 mg total) by mouth daily.  90 tablet  3  . Coenzyme Q10 (CO Q-10) 300 MG CAPS Take by mouth daily.      . diazepam (VALIUM) 5 MG tablet One po qhs prn muscle spasm  20 tablet  0  . fluticasone (FLONASE) 50 MCG/ACT nasal spray Place 2 sprays into the nose daily.        . magnesium gluconate (MAGONATE) 500 MG tablet Take 500 mg by mouth 2 (two) times daily.      . Multiple Vitamin (MULTIVITAMIN) tablet Take 1 tablet by mouth daily.      Marland Kitchen omeprazole (PRILOSEC) 20 MG capsule One tab every other day       .  pravastatin (PRAVACHOL) 20 MG tablet Take 1 tablet (20 mg total) by mouth daily.  90 tablet  3   No current facility-administered medications on file prior to visit.    ROS:  Out of a complete 14 system review of symptoms, the patient complains only of the following symptoms, and all other reviewed systems are negative.  Allergies Leg pain Headache  Sleepiness  Blood pressure 144/91, pulse 78, height 5' 8.75" (1.746 m), weight 192 lb (87.091 kg).  Physical Exam  General: The patient is alert and cooperative at the time of the examination.  Skin: No significant peripheral edema is noted.   Neurologic Exam  Cranial nerves: Facial symmetry is present. Speech is normal, no aphasia or dysarthria is noted. Extraocular movements are full. Visual fields are full.  Motor: The patient has good strength in all 4  extremities.  Coordination: The patient has good finger-nose-finger and heel-to-shin bilaterally.  Gait and station: The patient has a normal gait. Tandem gait is normal. Romberg is negative. No drift is seen.  Reflexes: Deep tendon reflexes are symmetric.   Assessment/Plan:  1. Lumbosacral spinal stenosis  2. Bilateral lower extremity discomfort  The history given by the patient is somewhat unusual for spinal stenosis. The patient feels better when he is active, without pseudo-claudication. The patient also notes that the pain in the legs is worse at nighttime when he is inactive. The patient will be set up for further blood work, and EMG and nerve conduction study evaluation will be done. The patient will have nerve conductions on both legs, EMG studies on both legs. The patient will followup for the above study. The gabapentin will be increased to 900 mg at night. In the future, epidural steroid injections may be considered. At some point, the patient likely will require surgery.  Marlan Palau MD 08/18/2012 8:41 PM  [facility] [address] [place], [place] [address]  Phone [phone] Fax [phone]

## 2012-08-19 ENCOUNTER — Ambulatory Visit (INDEPENDENT_AMBULATORY_CARE_PROVIDER_SITE_OTHER): Payer: Medicare Other | Admitting: Neurology

## 2012-08-19 ENCOUNTER — Encounter (INDEPENDENT_AMBULATORY_CARE_PROVIDER_SITE_OTHER): Payer: Medicare Other

## 2012-08-19 DIAGNOSIS — G544 Lumbosacral root disorders, not elsewhere classified: Secondary | ICD-10-CM

## 2012-08-19 DIAGNOSIS — M48061 Spinal stenosis, lumbar region without neurogenic claudication: Secondary | ICD-10-CM

## 2012-08-19 DIAGNOSIS — Z0289 Encounter for other administrative examinations: Secondary | ICD-10-CM

## 2012-08-19 DIAGNOSIS — M79609 Pain in unspecified limb: Secondary | ICD-10-CM

## 2012-08-19 LAB — RHEUMATOID FACTOR: Rhuematoid fact SerPl-aCnc: 10.9 IU/mL (ref 0.0–13.9)

## 2012-08-19 LAB — TSH: TSH: 3.98 u[IU]/mL (ref 0.450–4.500)

## 2012-08-19 LAB — VITAMIN B12: Vitamin B-12: 497 pg/mL (ref 211–946)

## 2012-08-19 LAB — ANA: Anti Nuclear Antibody(ANA): POSITIVE — AB

## 2012-08-19 NOTE — Procedures (Signed)
  HISTORY:  Elridge Stemm is a 67 year old gentleman with a history of lower extremity discomfort that affects the knees down to the feet, worse when he is lying down and inactive, better when he is up and active. The patient has been found to have severe lumbosacral spinal stenosis. The patient however, does not report pseudoclaudication symptoms. The patient is being evaluated for a possible peripheral neuropathy.  NERVE CONDUCTION STUDIES:  Nerve conduction studies were performed on both lower extremities. The distal motor latencies and motor amplitudes for the peroneal and posterior tibial nerves were within normal limits. The nerve conduction velocities for these nerves were also normal. The H reflex latencies were normal. The sensory latencies for the peroneal nerves were within normal limits.    EMG STUDIES:  EMG study was performed on the right lower extremity:  The tibialis anterior muscle reveals 2 to 4K motor units with full recruitment. No fibrillations or positive waves were seen. The peroneus tertius muscle reveals 2 to 6K motor units with full recruitment. No fibrillations or positive waves were seen. The medial gastrocnemius muscle reveals 1 to 3K motor units with full recruitment. No fibrillations or positive waves were seen. The vastus lateralis muscle reveals 2 to 4K motor units with full recruitment. No fibrillations or positive waves were seen. The iliopsoas muscle reveals 2 to 4K motor units with full recruitment. No fibrillations or positive waves were seen. The biceps femoris muscle (long head) reveals 2 to 4K motor units with full recruitment. No fibrillations or positive waves were seen. The lumbosacral paraspinal muscles were tested at 3 levels, and revealed no abnormalities of insertional activity in the upper level, 1+ fibrillations and positive waves in the middle and lower levels. There was good relaxation.  EMG study was performed on the left lower  extremity:  The tibialis anterior muscle reveals 2 to 10K motor units with decreased  recruitment. No fibrillations or positive waves were seen. The peroneus tertius muscle reveals 2 to 4K motor units with full recruitment. 1+  fibrillations or positive waves were seen. The medial gastrocnemius muscle reveals 1 to 3K motor units with full recruitment. No fibrillations or positive waves were seen. The vastus lateralis muscle reveals 2 to 4K motor units with full recruitment. No fibrillations or positive waves were seen. The iliopsoas muscle reveals 2 to 4K motor units with full recruitment. No fibrillations or positive waves were seen. The biceps femoris muscle (long head) reveals 2 to 4K motor units with full recruitment. No fibrillations or positive waves were seen. The lumbosacral paraspinal muscles were tested at 3 levels, and revealed 1+ fibrillations and positive waves at all 3 levels tested. There was good relaxation.    IMPRESSION:  nerve conduction studies done on both lower extremities were within normal limits. There is no evidence of a peripheral neuropathy. EMG evaluation of the right lower extremity was notable for findings suggestive of a very mild primarily chronic L5 radiculopathy. EMG study of the left lower extremity shows more prominent chronic and acute changes of an L5 radiculopathy. No other significant abnormalities were seen.   Marlan Palau MD 08/19/2012 5:24 PM  [facility] [address] [place], [place] [address]  Phone [phone] Fax [phone]

## 2012-09-23 ENCOUNTER — Telehealth: Payer: Self-pay | Admitting: Family Medicine

## 2012-09-23 NOTE — Telephone Encounter (Signed)
Caller: Donald Pope/Patient; Phone: [phone]; Reason for Call: Ambien RX request for Sleep while out of Country.  Pt has problems sleepings w/ history of Spinal Stenosis, has Valium RX but doesn't want to take while away from home.  Pt will leave on 10-17-12.  Triage offered, Pt asymptomatic.  Pt last OV was 08-18-12.  Per EPIC, Pt uses [facility] [place].  PLEASE REVIEW W/ MD AND F/U PT.

## 2012-09-23 NOTE — Telephone Encounter (Signed)
I do not see Ambien on pt active or inactive med list.  The Valium was last filled 03-13-13, #20 with 0 refills

## 2012-09-28 MED ORDER — ZOLPIDEM TARTRATE 10 MG PO TABS: 10.0000 mg | ORAL_TABLET | Freq: Every evening | ORAL | Status: DC | PRN

## 2012-09-28 NOTE — Telephone Encounter (Signed)
ambien 10 mg one po qhs prn disp #15 with no refill.

## 2012-09-28 NOTE — Telephone Encounter (Signed)
Pt following up on request. Pls advise 

## 2012-09-28 NOTE — Telephone Encounter (Signed)
Pt informed

## 2012-11-24 ENCOUNTER — Encounter: Payer: Self-pay | Admitting: Gastroenterology

## 2012-11-25 ENCOUNTER — Encounter: Payer: Self-pay | Admitting: Cardiology

## 2012-11-25 ENCOUNTER — Ambulatory Visit (INDEPENDENT_AMBULATORY_CARE_PROVIDER_SITE_OTHER): Payer: 59 | Admitting: Cardiology

## 2012-11-25 VITALS — BP 120/78 | HR 68 | Ht 68.75 in | Wt 197.1 lb

## 2012-11-25 DIAGNOSIS — G4733 Obstructive sleep apnea (adult) (pediatric): Secondary | ICD-10-CM

## 2012-11-25 DIAGNOSIS — E785 Hyperlipidemia, unspecified: Secondary | ICD-10-CM

## 2012-11-25 DIAGNOSIS — I2510 Atherosclerotic heart disease of native coronary artery without angina pectoris: Secondary | ICD-10-CM

## 2012-11-25 DIAGNOSIS — I4510 Unspecified right bundle-branch block: Secondary | ICD-10-CM

## 2012-11-25 NOTE — Patient Instructions (Addendum)
Your physician recommends that you continue on your current medications as directed. Please refer to the Current Medication list given to you today.  Your physician wants you to follow-up in: 1 year with Dr. Earney Hamburg.  You will receive a reminder letter in the mail two months in advance. If you don't receive a letter, please call our office to schedule the follow-up appointment.

## 2012-11-25 NOTE — Progress Notes (Signed)
HPI Donald Pope returns today for evaluation and management of his nonobstructive coronary artery disease. He's doing remarkably well with no angina or ischemic symptoms. He is compliant with his medications. His blood work being followed by primary care.  He is to enroll in a Y exercise program. He still has problems with his legs and has lumbar stenosis. He is followed by neurology.  Past Medical History  Diagnosis Date  . HYPERLIPIDEMIA 09/13/2008  . THROMBOCYTOPENIA 09/13/2008  . DEPRESSION 09/13/2008  . OBSTRUCTIVE SLEEP APNEA 10/23/2009    cpap  . CAD, NATIVE VESSEL 10/23/2009  . RBBB 10/26/2009  . GERD 09/13/2008  . False positive stress test     history of, suggestive of inferior ischemia  . Benign prostatic hypertrophy     possible  . Spinal stenosis of lumbar region 08/18/2012    Current Outpatient Prescriptions  Medication Sig Dispense Refill  . aspirin 81 MG EC tablet Take 81 mg by mouth daily.        . cetirizine (ZYRTEC) 10 MG tablet Take 10 mg by mouth daily.      . cholecalciferol (VITAMIN D) 1000 UNITS tablet Take 2,000 Units by mouth daily.      . fluticasone (FLONASE) 50 MCG/ACT nasal spray Place 2 sprays into the nose daily.        Marland Kitchen gabapentin (NEURONTIN) 600 MG tablet Take 1.5 tablets (900 mg total) by mouth at bedtime.  50 tablet  5  . Multiple Vitamin (MULTIVITAMIN) tablet Take 1 tablet by mouth daily.      Marland Kitchen omeprazole (PRILOSEC) 20 MG capsule One tab every other day       . pravastatin (PRAVACHOL) 20 MG tablet Take 1 tablet (20 mg total) by mouth daily.  90 tablet  3   No current facility-administered medications for this visit.    Allergies  Allergen Reactions  . Codeine Sulfate     REACTION: urination problems    Family History  Problem Relation Age of Onset  . Heart disease Mother 40    CHF  . Heart disease Father   . COPD Father   . Hypertension Sister   . Hyperlipidemia Sister   . Colon cancer Neg Hx   . Stomach cancer Neg Hx     History    Social History  . Marital Status: Married    Spouse Name: N/A    Number of Children: N/A  . Years of Education: N/A   Occupational History  . Not on file.   Social History Main Topics  . Smoking status: Never Smoker   . Smokeless tobacco: Never Used  . Alcohol Use: 3.0 oz/week    5 Glasses of wine per week  . Drug Use: No  . Sexually Active: Not on file   Other Topics Concern  . Not on file   Social History Narrative  . No narrative on file    ROS ALL NEGATIVE EXCEPT THOSE NOTED IN HPI  PE  General Appearance: well developed, well nourished in no acute distress, muscular, overweight HEENT: symmetrical face, PERRLA, good dentition  Neck: no JVD, thyromegaly, or adenopathy, trachea midline Chest: symmetric without deformity Cardiac: PMI non-displaced, RRR, normal S1, S2, no gallop or murmur Lung: clear to ausculation and percussion Vascular: all pulses full without bruits  Abdominal: nondistended, nontender, good bowel sounds, no HSM, no bruits Extremities: no cyanosis, clubbing or edema, no sign of DVT, no varicosities  Skin: normal color, no rashes Neuro: alert and oriented x 3, non-focal  Pysch: normal affect  EKG Normal sinus rhythm, right bundle branch block, stable BMET    Component Value Date/Time   NA 138 12/24/2011 0856   K 4.4 12/24/2011 0856   CL 102 12/24/2011 0856   CO2 25 12/24/2011 0856   GLUCOSE 112* 12/24/2011 0856   BUN 20 12/24/2011 0856   CREATININE 1.1 12/24/2011 0856   CALCIUM 9.3 12/24/2011 0856   GFRNONAA 79.80 09/13/2009 0903    Lipid Panel     Component Value Date/Time   CHOL 188 03/02/2012 0940   TRIG 277.0* 03/02/2012 0940   HDL 33.70* 03/02/2012 0940   CHOLHDL 6 03/02/2012 0940   VLDL 55.4* 03/02/2012 0940   LDLCALC 71 12/24/2011 0856    CBC    Component Value Date/Time   WBC 6.4 12/24/2011 0856   RBC 4.66 12/24/2011 0856   HGB 14.3 12/24/2011 0856   HCT 43.0 12/24/2011 0856   PLT 124.0* 12/24/2011 0856   MCV 92.4 12/24/2011  0856   MCHC 33.3 12/24/2011 0856   RDW 13.5 12/24/2011 0856   LYMPHSABS 1.8 12/24/2011 0856   MONOABS 0.5 12/24/2011 0856   EOSABS 0.1 12/24/2011 0856   BASOSABS 0.0 12/24/2011 0856

## 2012-11-25 NOTE — Assessment & Plan Note (Signed)
Stable. No change in secondary preventative therapy. Return to the office in one year to see Dr.McAlhaney.

## 2012-12-02 ENCOUNTER — Encounter: Payer: Self-pay | Admitting: Gastroenterology

## 2012-12-22 ENCOUNTER — Telehealth: Payer: Self-pay | Admitting: Neurology

## 2012-12-22 DIAGNOSIS — M4807 Spinal stenosis, lumbosacral region: Secondary | ICD-10-CM

## 2012-12-24 NOTE — Telephone Encounter (Signed)
Patient having constant leg pain. Requesting PT. Called to inform message received, no answer.

## 2012-12-24 NOTE — Telephone Encounter (Signed)
I called patient. I talked with the wife. The patient is having ongoing discomfort. I do not think that physical therapy will help. I will set the patient up for an epidural steroid injection for the spinal stenosis.

## 2012-12-28 ENCOUNTER — Other Ambulatory Visit (INDEPENDENT_AMBULATORY_CARE_PROVIDER_SITE_OTHER): Payer: Medicare Other

## 2012-12-28 DIAGNOSIS — Z0000 Encounter for general adult medical examination without abnormal findings: Secondary | ICD-10-CM

## 2012-12-28 DIAGNOSIS — E785 Hyperlipidemia, unspecified: Secondary | ICD-10-CM

## 2012-12-28 LAB — HEPATIC FUNCTION PANEL
ALT: 23 U/L (ref 0–53)
AST: 25 U/L (ref 0–37)
Albumin: 4.2 g/dL (ref 3.5–5.2)
Alkaline Phosphatase: 52 U/L (ref 39–117)
Bilirubin, Direct: 0.1 mg/dL (ref 0.0–0.3)
Total Bilirubin: 0.6 mg/dL (ref 0.3–1.2)
Total Protein: 7.4 g/dL (ref 6.0–8.3)

## 2012-12-28 LAB — CBC WITH DIFFERENTIAL/PLATELET
Basophils Absolute: 0 10*3/uL (ref 0.0–0.1)
Basophils Relative: 0.6 % (ref 0.0–3.0)
Eosinophils Absolute: 0.1 10*3/uL (ref 0.0–0.7)
Eosinophils Relative: 1.9 % (ref 0.0–5.0)
HCT: 38.6 % — ABNORMAL LOW (ref 39.0–52.0)
Hemoglobin: 13.1 g/dL (ref 13.0–17.0)
Lymphocytes Relative: 32.2 % (ref 12.0–46.0)
Lymphs Abs: 1.6 10*3/uL (ref 0.7–4.0)
MCHC: 33.9 g/dL (ref 30.0–36.0)
MCV: 89.7 fl (ref 78.0–100.0)
Monocytes Absolute: 0.5 10*3/uL (ref 0.1–1.0)
Monocytes Relative: 9 % (ref 3.0–12.0)
Neutro Abs: 2.8 10*3/uL (ref 1.4–7.7)
Neutrophils Relative %: 56.3 % (ref 43.0–77.0)
Platelets: 112 10*3/uL — ABNORMAL LOW (ref 150.0–400.0)
RBC: 4.3 Mil/uL (ref 4.22–5.81)
RDW: 13.4 % (ref 11.5–14.6)
WBC: 5.1 10*3/uL (ref 4.5–10.5)

## 2012-12-28 LAB — BASIC METABOLIC PANEL
BUN: 15 mg/dL (ref 6–23)
CO2: 28 mEq/L (ref 19–32)
Calcium: 9 mg/dL (ref 8.4–10.5)
Chloride: 104 mEq/L (ref 96–112)
Creatinine, Ser: 1 mg/dL (ref 0.4–1.5)
GFR: 82.81 mL/min (ref >60.00)
Glucose, Bld: 101 mg/dL — ABNORMAL HIGH (ref 70–99)
Potassium: 4.2 mEq/L (ref 3.5–5.1)
Sodium: 139 mEq/L (ref 135–145)

## 2012-12-28 LAB — LIPID PANEL
Cholesterol: 183 mg/dL (ref 0–200)
HDL: 32.2 mg/dL — ABNORMAL LOW (ref >39.00)
Total CHOL/HDL Ratio: 6
Triglycerides: 267 mg/dL — ABNORMAL HIGH (ref 0.0–149.0)
VLDL: 53.4 mg/dL — ABNORMAL HIGH (ref 0.0–40.0)

## 2012-12-28 LAB — LDL CHOLESTEROL, DIRECT: Direct LDL: 123.6 mg/dL

## 2012-12-28 LAB — POCT URINALYSIS DIPSTICK
Bilirubin, UA: NEGATIVE
Blood, UA: NEGATIVE
Glucose, UA: NEGATIVE
Ketones, UA: NEGATIVE
Leukocytes, UA: NEGATIVE
Nitrite, UA: NEGATIVE
Protein, UA: NEGATIVE
Spec Grav, UA: 1.025
Urobilinogen, UA: 1
pH, UA: 6

## 2012-12-28 LAB — TSH: TSH: 2.71 u[IU]/mL (ref 0.35–5.50)

## 2012-12-28 LAB — PSA: PSA: 0.86 ng/mL (ref 0.10–4.00)

## 2012-12-29 ENCOUNTER — Other Ambulatory Visit: Payer: Self-pay | Admitting: Neurology

## 2012-12-29 DIAGNOSIS — M4807 Spinal stenosis, lumbosacral region: Secondary | ICD-10-CM

## 2013-01-01 ENCOUNTER — Encounter: Payer: Self-pay | Admitting: Family Medicine

## 2013-01-01 ENCOUNTER — Ambulatory Visit
Admission: RE | Admit: 2013-01-01 | Discharge: 2013-01-01 | Disposition: A | Discharge status: Home / Self Care | Payer: Medicare Other | Source: Ambulatory Visit | Attending: Neurology | Admitting: Neurology

## 2013-01-01 ENCOUNTER — Ambulatory Visit (INDEPENDENT_AMBULATORY_CARE_PROVIDER_SITE_OTHER): Payer: Medicare Other | Admitting: Family Medicine

## 2013-01-01 VITALS — BP 153/79 | HR 63

## 2013-01-01 VITALS — BP 136/72 | HR 86 | Temp 98.1°F | Ht 68.0 in | Wt 197.0 lb

## 2013-01-01 DIAGNOSIS — Z0000 Encounter for general adult medical examination without abnormal findings: Secondary | ICD-10-CM

## 2013-01-01 DIAGNOSIS — Z23 Encounter for immunization: Secondary | ICD-10-CM

## 2013-01-01 DIAGNOSIS — G544 Lumbosacral root disorders, not elsewhere classified: Secondary | ICD-10-CM

## 2013-01-01 DIAGNOSIS — M4807 Spinal stenosis, lumbosacral region: Secondary | ICD-10-CM

## 2013-01-01 DIAGNOSIS — Z2911 Encounter for prophylactic immunotherapy for respiratory syncytial virus (RSV): Secondary | ICD-10-CM

## 2013-01-01 MED ORDER — METHYLPREDNISOLONE ACETATE 40 MG/ML INJ SUSP (RADIOLOG
120.0000 mg | Freq: Once | INTRAMUSCULAR | Status: AC
  Administered 2013-01-01: 120 mg via EPIDURAL

## 2013-01-01 MED ORDER — IOHEXOL 180 MG/ML  SOLN
1.0000 mL | Freq: Once | INTRAMUSCULAR | Status: AC | PRN
  Administered 2013-01-01: 1 mL via EPIDURAL

## 2013-01-01 NOTE — Patient Instructions (Addendum)
Hypertriglyceridemia  Diet for High blood levels of Triglycerides Most fats in food are triglycerides. Triglycerides in your blood are stored as fat in your body. High levels of triglycerides in your blood may put you at a greater risk for heart disease and stroke.  Normal triglyceride levels are less than 150 mg/dL. Borderline high levels are 150-199 mg/dl. High levels are 200 - 499 mg/dL, and very high triglyceride levels are greater than 500 mg/dL. The decision to treat high triglycerides is generally based on the level. For people with borderline or high triglyceride levels, treatment includes weight loss and exercise. Drugs are recommended for people with very high triglyceride levels. Many people who need treatment for high triglyceride levels have metabolic syndrome. This syndrome is a collection of disorders that often include: insulin resistance, high blood pressure, blood clotting problems, high cholesterol and triglycerides. TESTING PROCEDURE FOR TRIGLYCERIDES  You should not eat 4 hours before getting your triglycerides measured. The normal range of triglycerides is between 10 and 250 milligrams per deciliter (mg/dl). Some people may have extreme levels (1000 or above), but your triglyceride level may be too high if it is above 150 mg/dl, depending on what other risk factors you have for heart disease.  People with high blood triglycerides may also have high blood cholesterol levels. If you have high blood cholesterol as well as high blood triglycerides, your risk for heart disease is probably greater than if you only had high triglycerides. High blood cholesterol is one of the main risk factors for heart disease. CHANGING YOUR DIET  Your weight can affect your blood triglyceride level. If you are more than 20% above your ideal body weight, you may be able to lower your blood triglycerides by losing weight. Eating less and exercising regularly is the best way to combat this. Fat provides more  calories than any other food. The best way to lose weight is to eat less fat. Only 30% of your total calories should come from fat. Less than 7% of your diet should come from saturated fat. A diet low in fat and saturated fat is the same as a diet to decrease blood cholesterol. By eating a diet lower in fat, you may lose weight, lower your blood cholesterol, and lower your blood triglyceride level.  Eating a diet low in fat, especially saturated fat, may also help you lower your blood triglyceride level. Ask your dietitian to help you figure how much fat you can eat based on the number of calories your caregiver has prescribed for you.  Exercise, in addition to helping with weight loss may also help lower triglyceride levels.   Alcohol can increase blood triglycerides. You may need to stop drinking alcoholic beverages.  Too much carbohydrate in your diet may also increase your blood triglycerides. Some complex carbohydrates are necessary in your diet. These may include bread, rice, potatoes, other starchy vegetables and cereals.  Reduce "simple" carbohydrates. These may include pure sugars, candy, honey, and jelly without losing other nutrients. If you have the kind of high blood triglycerides that is affected by the amount of carbohydrates in your diet, you will need to eat less sugar and less high-sugar foods. Your caregiver can help you with this.  Adding 2-4 grams of fish oil (EPA+ DHA) may also help lower triglycerides. Speak with your caregiver before adding any supplements to your regimen. Following the Diet  Maintain your ideal weight. Your caregivers can help you with a diet. Generally, eating less food and getting more   exercise will help you lose weight. Joining a weight control group may also help. Ask your caregivers for a good weight control group in your area.  Eat low-fat foods instead of high-fat foods. This can help you lose weight too.  These foods are lower in fat. Eat MORE of these:    Dried beans, peas, and lentils.  Egg whites.  Low-fat cottage cheese.  Fish.  Lean cuts of meat, such as round, sirloin, rump, and flank (cut extra fat off meat you fix).  Whole grain breads, cereals and pasta.  Skim and nonfat dry milk.  Low-fat yogurt.  Poultry without the skin.  Cheese made with skim or part-skim milk, such as mozzarella, parmesan, farmers', ricotta, or pot cheese. These are higher fat foods. Eat LESS of these:   Whole milk and foods made from whole milk, such as American, blue, cheddar, monterey jack, and swiss cheese  High-fat meats, such as luncheon meats, sausages, knockwurst, bratwurst, hot dogs, ribs, corned beef, ground pork, and regular ground beef.  Fried foods. Limit saturated fats in your diet. Substituting unsaturated fat for saturated fat may decrease your blood triglyceride level. You will need to read package labels to know which products contain saturated fats.  These foods are high in saturated fat. Eat LESS of these:   Fried pork skins.  Whole milk.  Skin and fat from poultry.  Palm oil.  Butter.  Shortening.  Cream cheese.  Bacon.  Margarines and baked goods made from listed oils.  Vegetable shortenings.  Chitterlings.  Fat from meats.  Coconut oil.  Palm kernel oil.  Lard.  Cream.  Sour cream.  Fatback.  Coffee whiteners and non-dairy creamers made with these oils.  Cheese made from whole milk. Use unsaturated fats (both polyunsaturated and monounsaturated) moderately. Remember, even though unsaturated fats are better than saturated fats; you still want a diet low in total fat.  These foods are high in unsaturated fat:   Canola oil.  Sunflower oil.  Mayonnaise.  Almonds.  Peanuts.  Pine nuts.  Margarines made with these oils.  Safflower oil.  Olive oil.  Avocados.  Cashews.  Peanut butter.  Sunflower seeds.  Soybean oil.  Peanut  oil.  Olives.  Pecans.  Walnuts.  Pumpkin seeds. Avoid sugar and other high-sugar foods. This will decrease carbohydrates without decreasing other nutrients. Sugar in your food goes rapidly to your blood. When there is excess sugar in your blood, your liver may use it to make more triglycerides. Sugar also contains calories without other important nutrients.  Eat LESS of these:   Sugar, brown sugar, powdered sugar, jam, jelly, preserves, honey, syrup, molasses, pies, candy, cakes, cookies, frosting, pastries, colas, soft drinks, punches, fruit drinks, and regular gelatin.  Avoid alcohol. Alcohol, even more than sugar, may increase blood triglycerides. In addition, alcohol is high in calories and low in nutrients. Ask for sparkling water, or a diet soft drink instead of an alcoholic beverage. Suggestions for planning and preparing meals   Bake, broil, grill or roast meats instead of frying.  Remove fat from meats and skin from poultry before cooking.  Add spices, herbs, lemon juice or vinegar to vegetables instead of salt, rich sauces or gravies.  Use a non-stick skillet without fat or use no-stick sprays.  Cool and refrigerate stews and broth. Then remove the hardened fat floating on the surface before serving.  Refrigerate meat drippings and skim off fat to make low-fat gravies.  Serve more fish.  Use less butter,   margarine and other high-fat spreads on bread or vegetables.  Use skim or reconstituted non-fat dry milk for cooking.  Cook with low-fat cheeses.  Substitute low-fat yogurt or cottage cheese for all or part of the sour cream in recipes for sauces, dips or congealed salads.  Use half yogurt/half mayonnaise in salad recipes.  Substitute evaporated skim milk for cream. Evaporated skim milk or reconstituted non-fat dry milk can be whipped and substituted for whipped cream in certain recipes.  Choose fresh fruits for dessert instead of high-fat foods such as pies or  cakes. Fruits are naturally low in fat. When Dining Out   Order low-fat appetizers such as fruit or vegetable juice, pasta with vegetables or tomato sauce.  Select clear, rather than cream soups.  Ask that dressings and gravies be served on the side. Then use less of them.  Order foods that are baked, broiled, poached, steamed, stir-fried, or roasted.  Ask for margarine instead of butter, and use only a small amount.  Drink sparkling water, unsweetened tea or coffee, or diet soft drinks instead of alcohol or other sweet beverages. QUESTIONS AND ANSWERS ABOUT OTHER FATS IN THE BLOOD: SATURATED FAT, TRANS FAT, AND CHOLESTEROL What is trans fat? Trans fat is a type of fat that is formed when vegetable oil is hardened through a process called hydrogenation. This process helps makes foods more solid, gives them shape, and prolongs their shelf life. Trans fats are also called hydrogenated or partially hydrogenated oils.  What do saturated fat, trans fat, and cholesterol in foods have to do with heart disease? Saturated fat, trans fat, and cholesterol in the diet all raise the level of LDL "bad" cholesterol in the blood. The higher the LDL cholesterol, the greater the risk for coronary heart disease (CHD). Saturated fat and trans fat raise LDL similarly.  What foods contain saturated fat, trans fat, and cholesterol? High amounts of saturated fat are found in animal products, such as fatty cuts of meat, chicken skin, and full-fat dairy products like butter, whole milk, cream, and cheese, and in tropical vegetable oils such as palm, palm kernel, and coconut oil. Trans fat is found in some of the same foods as saturated fat, such as vegetable shortening, some margarines (especially hard or stick margarine), crackers, cookies, baked goods, fried foods, salad dressings, and other processed foods made with partially hydrogenated vegetable oils. Small amounts of trans fat also occur naturally in some animal  products, such as milk products, beef, and lamb. Foods high in cholesterol include liver, other organ meats, egg yolks, shrimp, and full-fat dairy products. How can I use the new food label to make heart-healthy food choices? Check the Nutrition Facts panel of the food label. Choose foods lower in saturated fat, trans fat, and cholesterol. For saturated fat and cholesterol, you can also use the Percent Daily Value (%DV): 5% DV or less is low, and 20% DV or more is high. (There is no %DV for trans fat.) Use the Nutrition Facts panel to choose foods low in saturated fat and cholesterol, and if the trans fat is not listed, read the ingredients and limit products that list shortening or hydrogenated or partially hydrogenated vegetable oil, which tend to be high in trans fat. POINTS TO REMEMBER:   Discuss your risk for heart disease with your caregivers, and take steps to reduce risk factors.  Change your diet. Choose foods that are low in saturated fat, trans fat, and cholesterol.  Add exercise to your daily routine if   it is not already being done. Participate in physical activity of moderate intensity, like brisk walking, for at least 30 minutes on most, and preferably all days of the week. No time? Break the 30 minutes into three, 10-minute segments during the day.  Stop smoking. If you do smoke, contact your caregiver to discuss ways in which they can help you quit.  Do not use street drugs.  Maintain a normal weight.  Maintain a healthy blood pressure.  Keep up with your blood work for checking the fats in your blood as directed by your caregiver. Document Released: 02/15/2004 Document Revised: 10/29/2011 Document Reviewed: 09/12/2008 [facility] Patient Information 2014 [place], [place].  Consider omega 3 supplement 2-3 grams daily for high triglycerides.

## 2013-01-01 NOTE — Progress Notes (Signed)
Subjective:    Patient ID: Donald Pope, male    DOB: [birth year], 67 y.o.   MRN: [id number]  HPI Patient here for complete physical. Chronic problems were reviewed. History of obstructive sleep apnea, chronic mild thrombocytopenia, lumbar stenosis, hyperlipidemia, GERD, CAD. He has ongoing problems with back pain with lumbar stenosis and has scheduled epidural later today Medications reviewed. Compliant with all.  He has some chronic myalgias involving lower legs. At one point, we thought this may be statin related but after stopping this for over one month symptoms did not improve. He does not smoke. Immunizations are up to date. He had adenomatous colon polyp on colonoscopy last year and gets repeated this October.  Past Medical History  Diagnosis Date  . HYPERLIPIDEMIA 09/13/2008  . THROMBOCYTOPENIA 09/13/2008  . DEPRESSION 09/13/2008  . OBSTRUCTIVE SLEEP APNEA 10/23/2009    cpap  . CAD, NATIVE VESSEL 10/23/2009  . RBBB 10/26/2009  . GERD 09/13/2008  . False positive stress test     history of, suggestive of inferior ischemia  . Benign prostatic hypertrophy     possible  . Spinal stenosis of lumbar region 08/18/2012   Past Surgical History  Procedure Laterality Date  . Tonsillectomy    . Nasal polyp excision      reports that he has never smoked. He has never used smokeless tobacco. He reports that he drinks about 3.0 ounces of alcohol per week. He reports that he does not use illicit drugs. family history includes COPD in his father; Heart disease in his father; Heart disease (age of onset: 57) in his mother; Hyperlipidemia in his sister; Hypertension in his sister. There is no history of Colon cancer or Stomach cancer. Allergies  Allergen Reactions  . Codeine Sulfate     REACTION: urination problems      Review of Systems  Constitutional: Negative for fever, activity change, appetite change and fatigue.  HENT: Negative for ear pain, congestion and trouble swallowing.   Eyes:  Negative for pain and visual disturbance.  Respiratory: Negative for cough, shortness of breath and wheezing.   Cardiovascular: Negative for chest pain and palpitations.  Gastrointestinal: Negative for nausea, vomiting, abdominal pain, diarrhea, constipation, blood in stool, abdominal distention and rectal pain.  Genitourinary: Negative for dysuria, hematuria and testicular pain.  Musculoskeletal: Positive for myalgias and back pain. Negative for joint swelling and arthralgias.  Skin: Negative for rash.  Neurological: Negative for dizziness, syncope and headaches.  Hematological: Negative for adenopathy.  Psychiatric/Behavioral: Negative for confusion and dysphoric mood.       Objective:   Physical Exam  Constitutional: He is oriented to person, place, and time. He appears well-developed and well-nourished. No distress.  HENT:  Right Ear: External ear normal.  Left Ear: External ear normal.  Mouth/Throat: Oropharynx is clear and moist.  Neck: Neck supple. No thyromegaly present.  Cardiovascular: Normal rate and regular rhythm.  Exam reveals no gallop.   Pulmonary/Chest: Effort normal and breath sounds normal. No respiratory distress. He has no wheezes. He has no rales.  Abdominal: Soft. Bowel sounds are normal. He exhibits no distension and no mass. There is no tenderness. There is no rebound and no guarding.  Musculoskeletal: He exhibits no edema.  Lymphadenopathy:    He has no cervical adenopathy.  Neurological: He is alert and oriented to person, place, and time. No cranial nerve deficit.  Skin: No rash noted.  Psychiatric: He has a normal mood and affect. His behavior is normal. Judgment and thought content  normal.          Assessment & Plan:  Complete physical. Immunizations are up-to-date. Continue yearly flu vaccine. Repeat colonoscopy later this year. Check on coverage for shingles vaccine. We discussed dietary measures to address hypertriglyceridemia. He will consider  omega-3 supplement

## 2013-02-01 ENCOUNTER — Ambulatory Visit (AMBULATORY_SURGERY_CENTER): Payer: Self-pay | Admitting: *Deleted

## 2013-02-01 VITALS — Ht 68.0 in | Wt 197.6 lb

## 2013-02-01 DIAGNOSIS — Z860100 Personal history of colon polyps, unspecified: Secondary | ICD-10-CM

## 2013-02-01 MED ORDER — MOVIPREP 100 G PO SOLR: 1.0000 | Freq: Once | ORAL | Status: DC

## 2013-02-01 NOTE — Progress Notes (Signed)
No egg or soy allergy. No anesthesia problems.  

## 2013-02-02 ENCOUNTER — Encounter: Payer: Self-pay | Admitting: Gastroenterology

## 2013-02-12 ENCOUNTER — Telehealth: Payer: Self-pay | Admitting: Neurology

## 2013-02-12 DIAGNOSIS — M47817 Spondylosis without myelopathy or radiculopathy, lumbosacral region: Secondary | ICD-10-CM

## 2013-02-12 NOTE — Telephone Encounter (Signed)
I called patient. We will get the epidural steroid injection set up for the low back.

## 2013-02-15 ENCOUNTER — Ambulatory Visit (AMBULATORY_SURGERY_CENTER): Payer: Medicare Other | Admitting: Gastroenterology

## 2013-02-15 ENCOUNTER — Encounter: Payer: Self-pay | Admitting: Gastroenterology

## 2013-02-15 VITALS — BP 131/76 | HR 58 | Temp 97.9°F | Resp 16 | Ht 68.0 in | Wt 197.0 lb

## 2013-02-15 DIAGNOSIS — K5730 Diverticulosis of large intestine without perforation or abscess without bleeding: Secondary | ICD-10-CM

## 2013-02-15 DIAGNOSIS — D126 Benign neoplasm of colon, unspecified: Secondary | ICD-10-CM

## 2013-02-15 DIAGNOSIS — Z860100 Personal history of colon polyps, unspecified: Secondary | ICD-10-CM

## 2013-02-15 DIAGNOSIS — Z1211 Encounter for screening for malignant neoplasm of colon: Secondary | ICD-10-CM

## 2013-02-15 MED ORDER — SODIUM CHLORIDE 0.9 % IV SOLN: 500.0000 mL | INTRAVENOUS | Status: DC

## 2013-02-15 NOTE — Patient Instructions (Addendum)
YOU HAD AN ENDOSCOPIC PROCEDURE TODAY AT THE [facility]: Refer to the procedure report that was given to you for any specific questions about what was found during the examination.  If the procedure report does not answer your questions, please call your gastroenterologist to clarify.  If you requested that your care partner not be given the details of your procedure findings, then the procedure report has been included in a sealed envelope for you to review at your convenience later.  YOU SHOULD EXPECT: Some feelings of bloating in the abdomen. Passage of more gas than usual.  Walking can help get rid of the air that was put into your GI tract during the procedure and reduce the bloating. If you had a lower endoscopy (such as a colonoscopy or flexible sigmoidoscopy) you may notice spotting of blood in your stool or on the toilet paper. If you underwent a bowel prep for your procedure, then you may not have a normal bowel movement for a few days.  DIET: Your first meal following the procedure should be a light meal and then it is ok to progress to your normal diet.  A half-sandwich or bowl of soup is an example of a good first meal.  Heavy or fried foods are harder to digest and may make you feel nauseous or bloated.  Likewise meals heavy in dairy and vegetables can cause extra gas to form and this can also increase the bloating.  Drink plenty of fluids but you should avoid alcoholic beverages for 24 hours.  ACTIVITY: Your care partner should take you home directly after the procedure.  You should plan to take it easy, moving slowly for the rest of the day.  You can resume normal activity the day after the procedure however you should NOT DRIVE or use heavy machinery for 24 hours (because of the sedation medicines used during the test).    SYMPTOMS TO REPORT IMMEDIATELY: A gastroenterologist can be reached at any hour.  During normal business hours, 8:30 AM to 5:00 PM Monday through Friday,  call [phone].  After hours and on weekends, please call the GI answering service at [phone] who will take a message and have the physician on call contact you.   Following lower endoscopy (colonoscopy or flexible sigmoidoscopy):  Excessive amounts of blood in the stool  Significant tenderness or worsening of abdominal pains  Swelling of the abdomen that is new, acute  Fever of 100F or higher    FOLLOW UP: If any biopsies were taken you will be contacted by phone or by letter within the next 1-3 weeks.  Call your gastroenterologist if you have not heard about the biopsies in 3 weeks.  Our staff will call the home number listed on your records the next business day following your procedure to check on you and address any questions or concerns that you may have at that time regarding the information given to you following your procedure. This is a courtesy call and so if there is no answer at the home number and we have not heard from you through the emergency physician on call, we will assume that you have returned to your regular daily activities without incident.  SIGNATURES/CONFIDENTIALITY: You and/or your care partner have signed paperwork which will be entered into your electronic medical record.  These signatures attest to the fact that that the information above on your After Visit Summary has been reviewed and is understood.  Full responsibility of the confidentiality   of this discharge information lies with you and/or your care-partner.     

## 2013-02-15 NOTE — Progress Notes (Signed)
Called to room to assist during endoscopic procedure.  Patient ID and intended procedure confirmed with present staff. Received instructions for my participation in the procedure from the performing physician.  

## 2013-02-15 NOTE — Op Note (Signed)
[facility] [address] [place], [address]   COLONOSCOPY PROCEDURE REPORT  PATIENT: Donald Pope, Donald Pope  MR#: [id number] BIRTHDATE: [birth year] , 67  yrs. old GENDER: Male ENDOSCOPIST: Mardella Layman, MD, [facility] REFERRED BY: PROCEDURE DATE:  02/15/2013 PROCEDURE:   Colonoscopy with biopsy First Screening Colonoscopy - Avg.  risk and is 50 yrs.  old or older - No.  Prior Negative Screening - Now for repeat screening. N/A      Polyps Removed Today? Yes. ASA CLASS:   Class II INDICATIONS:Patient's personal history of adenomatous colon polyps.  MEDICATIONS: Propofol (Diprivan) 230 mg IV  DESCRIPTION OF PROCEDURE:   After the risks benefits and alternatives of the procedure were thoroughly explained, informed consent was obtained.  A digital rectal exam revealed no abnormalities of the rectum.   The LB [id number]  endoscope was introduced through the anus and advanced to the cecum, which was identified by both the appendix and ileocecal valve. No adverse events experienced.   The quality of the prep was poor, using MoviPrep  The instrument was then slowly withdrawn as the colon was fully examined.      COLON FINDINGS: A smooth sessile polyp ranging between 3-40mm in size was found in the descending colon.  A polypectomy was performed with cold forceps.  The resection was complete and the polyp tissue was completely retrieved.   Mild diverticulosis was noted in the sigmoid colon and descending colon.  Retroflexed views revealed no abnormalities. The time to cecum=5 minutes 46 seconds.  Withdrawal time=7 minutes 57 seconds.  The scope was withdrawn and the procedure completed. COMPLICATIONS: There were no complications.  ENDOSCOPIC IMPRESSION: 1.   Sessile polyp ranging between 3-64mm in size was found in the descending colon; polypectomy was performed with cold forceps ..poor prep noted !! 2.   Mild diverticulosis was noted in the sigmoid colon  and descending colon  RECOMMENDATIONS: 1.  Await pathology results 2.  Repeat Colonoscopy in 3 years with "double prep"..... 3. High fiber diet   eSigned:  Mardella Layman, MD, [facility] 02/15/2013 10:04 AM   cc: Evelena Peat, MD   PATIENT NAME:  Donald Pope, Donald Pope MR#: [id number]

## 2013-02-15 NOTE — Progress Notes (Signed)
Patient did not experience any of the following events: a burn prior to discharge; a fall within the facility; wrong site/side/patient/procedure/implant event; or a hospital transfer or hospital admission upon discharge from the facility. (G8907) Patient did not have preoperative order for IV antibiotic SSI prophylaxis. (G8918)  

## 2013-02-15 NOTE — Progress Notes (Signed)
Procedure ends, to recovery, report given and VSS. 

## 2013-02-16 ENCOUNTER — Telehealth: Payer: Self-pay | Admitting: *Deleted

## 2013-02-16 NOTE — Telephone Encounter (Signed)
Left message on number given in admitting yesterday to return call if problems questions or concerns. ewm   

## 2013-02-18 ENCOUNTER — Other Ambulatory Visit: Payer: Self-pay | Admitting: Neurology

## 2013-02-18 DIAGNOSIS — M47817 Spondylosis without myelopathy or radiculopathy, lumbosacral region: Secondary | ICD-10-CM

## 2013-02-19 ENCOUNTER — Encounter: Payer: Self-pay | Admitting: Gastroenterology

## 2013-02-23 ENCOUNTER — Ambulatory Visit
Admission: RE | Admit: 2013-02-23 | Discharge: 2013-02-23 | Disposition: A | Discharge status: Home / Self Care | Payer: Medicare Other | Source: Ambulatory Visit | Attending: Neurology | Admitting: Neurology

## 2013-02-23 DIAGNOSIS — M47817 Spondylosis without myelopathy or radiculopathy, lumbosacral region: Secondary | ICD-10-CM

## 2013-02-23 MED ORDER — METHYLPREDNISOLONE ACETATE 40 MG/ML INJ SUSP (RADIOLOG
120.0000 mg | Freq: Once | INTRAMUSCULAR | Status: AC
  Administered 2013-02-23: 120 mg via EPIDURAL

## 2013-02-23 MED ORDER — IOHEXOL 180 MG/ML  SOLN
1.0000 mL | Freq: Once | INTRAMUSCULAR | Status: AC | PRN
  Administered 2013-02-23: 1 mL via EPIDURAL

## 2013-03-02 ENCOUNTER — Telehealth: Payer: Self-pay | Admitting: *Deleted

## 2013-03-02 DIAGNOSIS — M4807 Spinal stenosis, lumbosacral region: Secondary | ICD-10-CM

## 2013-03-02 NOTE — Telephone Encounter (Signed)
I called patient. He wants a referral to Dr. Marikay Alar at [facility] neurosurgical. The patient has severe spinal stenosis at the L4-5 level. The patient has ongoing low back pain.

## 2013-03-17 ENCOUNTER — Other Ambulatory Visit: Payer: Self-pay | Admitting: Family Medicine

## 2013-03-18 ENCOUNTER — Other Ambulatory Visit: Payer: Self-pay

## 2013-03-19 ENCOUNTER — Other Ambulatory Visit: Payer: Self-pay | Admitting: Neurology

## 2013-03-27 ENCOUNTER — Other Ambulatory Visit: Payer: Self-pay | Admitting: Family Medicine

## 2013-04-05 ENCOUNTER — Ambulatory Visit (INDEPENDENT_AMBULATORY_CARE_PROVIDER_SITE_OTHER): Payer: Medicare Other | Admitting: Family Medicine

## 2013-04-05 DIAGNOSIS — Z23 Encounter for immunization: Secondary | ICD-10-CM

## 2013-04-07 ENCOUNTER — Other Ambulatory Visit: Payer: Self-pay | Admitting: Neurological Surgery

## 2013-04-19 ENCOUNTER — Other Ambulatory Visit: Payer: Self-pay | Admitting: Neurology

## 2013-05-17 ENCOUNTER — Encounter (HOSPITAL_COMMUNITY)
Admission: RE | Admit: 2013-05-17 | Discharge: 2013-05-17 | Disposition: A | Discharge status: Home / Self Care | Payer: Medicare FFS | Source: Ambulatory Visit | Attending: Neurological Surgery | Admitting: Neurological Surgery

## 2013-05-17 ENCOUNTER — Encounter (HOSPITAL_COMMUNITY): Payer: Self-pay

## 2013-05-17 ENCOUNTER — Ambulatory Visit (HOSPITAL_COMMUNITY)
Admission: RE | Admit: 2013-05-17 | Discharge: 2013-05-17 | Disposition: A | Discharge status: Home / Self Care | Payer: Medicare FFS | Source: Ambulatory Visit | Attending: Neurological Surgery | Admitting: Neurological Surgery

## 2013-05-17 DIAGNOSIS — Z01818 Encounter for other preprocedural examination: Secondary | ICD-10-CM | POA: Insufficient documentation

## 2013-05-17 DIAGNOSIS — Q762 Congenital spondylolisthesis: Secondary | ICD-10-CM | POA: Insufficient documentation

## 2013-05-17 LAB — BASIC METABOLIC PANEL
BUN: 13 mg/dL (ref 6–23)
CO2: 27 mEq/L (ref 19–32)
Calcium: 9.7 mg/dL (ref 8.4–10.5)
Chloride: 101 mEq/L (ref 96–112)
Creatinine, Ser: 0.87 mg/dL (ref 0.50–1.35)
GFR calc Af Amer: >90 mL/min (ref >90)
GFR calc non Af Amer: 87 mL/min — ABNORMAL LOW (ref >90)
Glucose, Bld: 117 mg/dL — ABNORMAL HIGH (ref 70–99)
Potassium: 4.3 mEq/L (ref 3.7–5.3)
Sodium: 141 mEq/L (ref 137–147)

## 2013-05-17 LAB — CBC WITH DIFFERENTIAL/PLATELET
Basophils Absolute: 0 10*3/uL (ref 0.0–0.1)
Basophils Relative: 0 % (ref 0–1)
Eosinophils Absolute: 0 10*3/uL (ref 0.0–0.7)
Eosinophils Relative: 0 % (ref 0–5)
HCT: 41.7 % (ref 39.0–52.0)
Hemoglobin: 14.5 g/dL (ref 13.0–17.0)
Lymphocytes Relative: 18 % (ref 12–46)
Lymphs Abs: 1.4 10*3/uL (ref 0.7–4.0)
MCH: 31 pg (ref 26.0–34.0)
MCHC: 34.8 g/dL (ref 30.0–36.0)
MCV: 89.1 fL (ref 78.0–100.0)
Monocytes Absolute: 1.1 10*3/uL — ABNORMAL HIGH (ref 0.1–1.0)
Monocytes Relative: 14 % — ABNORMAL HIGH (ref 3–12)
Neutro Abs: 5.5 10*3/uL (ref 1.7–7.7)
Neutrophils Relative %: 68 % (ref 43–77)
Platelets: 150 10*3/uL (ref 150–400)
RBC: 4.68 MIL/uL (ref 4.22–5.81)
RDW: 13 % (ref 11.5–15.5)
WBC: 8 10*3/uL (ref 4.0–10.5)

## 2013-05-17 LAB — SURGICAL PCR SCREEN
MRSA, PCR: NEGATIVE
Staphylococcus aureus: POSITIVE — AB

## 2013-05-17 LAB — PROTIME-INR
INR: 0.88 (ref 0.00–1.49)
Prothrombin Time: 11.8 seconds (ref 11.6–15.2)

## 2013-05-17 NOTE — Pre-Procedure Instructions (Signed)
Donald Pope  05/17/2013   Your procedure is scheduled on:  05/27/13  Report to [place]  2 * 3 at 530 AM.  Call this number if you have problems the morning of surgery: [phone]   Remember:   Do not eat food or drink liquids after midnight.   Take these medicines the morning of surgery with A SIP OF WATER: zyrtec ,celexa,flonase,neurontin,prilosec   Do not wear jewelry, make-up or nail polish.  Do not wear lotions, powders, or perfumes. You may wear deodorant.  Do not shave 48 hours prior to surgery. Men may shave face and neck.  Do not bring valuables to the hospital.  [facility] is not responsible                  for any belongings or valuables.               Contacts, dentures or bridgework may not be worn into surgery.  Leave suitcase in the car. After surgery it may be brought to your room.  For patients admitted to the hospital, discharge time is determined by your                treatment team.               Patients discharged the day of surgery will not be allowed to drive  home.  Name and phone number of your driver:   Special Instructions: Shower using CHG 2 nights before surgery and the night before surgery.  If you shower the day of surgery use CHG.  Use special wash - you have one bottle of CHG for all showers.  You should use approximately 1/3 of the bottle for each shower.   Please read over the following fact sheets that you were given: Pain Booklet, Coughing and Deep Breathing, Blood Transfusion Information, MRSA Information and Surgical Site Infection Prevention

## 2013-05-18 ENCOUNTER — Encounter (HOSPITAL_COMMUNITY): Payer: Self-pay

## 2013-05-18 NOTE — Progress Notes (Addendum)
Anesthesia Chart Review:  Patient is a 68 year old male scheduled for L4-5 MAS, PLIF on 05/27/13 by Dr. Ronnald Ramp.  History includes non-smoker, HLD, OSA with CPAP use, CAD, GERD, BPH, right BBB, depression, thrombocytopenia, tonsillectomy.  Cardiologist has been Dr. Jenell Milliner with last visit being on 11/25/12.  Patient was doing well at that time, and one year follow-up with Dr. Angelena Form was recommended. PCP is listed as Dr. Carolann Littler.  Nuclear stress test on 12/29/07 (report found in 10/26/09 office note with Dr. Verl Blalock) showed:  Excerise capacity: Good exercise capacity  Blood Pressure response: Normal blood pressure response  Clinical symptoms: No chest pain or dyspnea  ECG impression: No significant ST segment change suggestive of ischemia  Overall impression: Low risk stress nuclear study with probable diaphragmatic attenuation and very mild inferior ischemia.   Cardiac cath on 12/16/05 showed: 1. LV: 119/4/9. EF 65% without regional wall motion abnormality.  2. No aortic stenosis or mitral regurgitation.  3. Left main: Angiographically normal.  4. LAD: Moderate-sized vessel giving rise to two small diagonals. The proximal LAD has a 20% stenosis. A septal has an 80% stenosis at its ostium. The mid LAD has a focal 30% stenosis.  5. Ramus intermedius: Fairly small vessel which is angiographically normal.  6. Circumflex: Small vessel with 30% stenosis in the mid vessel.  7. RCA: Large, dominant vessel. There is a calcified 30% stenosis of the mid vessel. There is a focal 40% stenosis approximately 20 mm prior to the origin of the PDA.  EKG on 11/25/12 showed NSR, right BBB.  CXR on 05/17/13 showed no active disease.  Preoperative labs noted.  Cr 0.87, glucose 117, H/H 14.5/41.7, PLT 150K.  WBC smear review showed teardrop cells in RBC morphology, atypical lymphocytes in WBC morphology, large platelets present.   Above reviewed with anesthesiologist Dr. Glennon Mac.  Recommend preoperative  cardiology clearance due to CAD history without functional study in > 5 years.  Will also need to notify surgeon and PCP of lab results.  Lorriane Shire called at Dr. Ronnald Ramp office about need for cardiac clearance and to have Dr. Ronnald Ramp review CBC results.  I also routed CBC results to Dr. Elease Hashimoto so he can follow-up in the future as he feels is indicated.    [facility]/Anesthesiology Phone [phone] 05/19/2013 9:27 AM  Addendum: 05/21/2013 9:45 PM Patient was seen by Dr. Aundra Dubin on 05/20/2013.  He did not think there was any contraindication for patient to undergo neurosurgical procedure as planned.

## 2013-05-19 ENCOUNTER — Telehealth: Payer: Self-pay | Admitting: *Deleted

## 2013-05-19 NOTE — Telephone Encounter (Signed)
Received surgical clearance for pt lumbar surgery which is scheduled on 05/27/13.  Scheduled DOD office visit with Dr. Aundra Dubin 05/20/13.  This is a former pt of Dr. Verl Blalock who was last seen 11/25/12  Horton Chin RN

## 2013-05-20 ENCOUNTER — Telehealth: Payer: Self-pay | Admitting: Cardiology

## 2013-05-20 ENCOUNTER — Ambulatory Visit (INDEPENDENT_AMBULATORY_CARE_PROVIDER_SITE_OTHER): Payer: Commercial Managed Care - HMO | Admitting: Cardiology

## 2013-05-20 ENCOUNTER — Encounter: Payer: Self-pay | Admitting: Cardiology

## 2013-05-20 VITALS — BP 156/88 | HR 98 | Ht 68.0 in | Wt 200.4 lb

## 2013-05-20 DIAGNOSIS — E785 Hyperlipidemia, unspecified: Secondary | ICD-10-CM

## 2013-05-20 DIAGNOSIS — M48061 Spinal stenosis, lumbar region without neurogenic claudication: Secondary | ICD-10-CM

## 2013-05-20 DIAGNOSIS — I2510 Atherosclerotic heart disease of native coronary artery without angina pectoris: Secondary | ICD-10-CM

## 2013-05-20 MED ORDER — ROSUVASTATIN CALCIUM 5 MG PO TABS
5.0000 mg | ORAL_TABLET | Freq: Every day | ORAL | Status: DC
Start: 2013-05-20 — End: 2013-11-04

## 2013-05-20 NOTE — Progress Notes (Signed)
Patient ID: Donald Pope, male   DOB: [birth year], 68 y.o.   MRN: [id number] PCP: Dr. Elease Hashimoto  68 yo with history of nonobstructive CAD presents for cardiology followup.  He has been seen by Dr. Verl Blalock in the past and is seen by me for the first time today. He has been doing well from a cardiovascular standpoint.  No chest pain, no exertional dyspnea.  He is active in general.  His main problem has been low back pain radiating into his legs bilaterally.  He as been found to have spinal stenosis and needs surgery.  BP today is running high. He says that it is not usually elevated.    Labs (8/14): LDL 124, HDL 32 Labs (1/15): K 4.3, creatinine 0.87  PMH: 1. CAD: Cardiolite in 2007 showed inferior ischemia so patient had LHC in 2007.  This showed 40% PDA stenosis, 80% stenosis in a septal perforator, 30% mLAD, EF 65%.   2. Hyperlipidemia: Myalgias with statins. 3. OSA on CPAP 4. RBBB 5. GERD 6. 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   FH: Mother with CHF  ROS: All systems reviewed and negative except as per HPI.   Current Outpatient Prescriptions  Medication Sig Dispense Refill  . aspirin 81 MG EC tablet Take 81 mg by mouth daily.        . cetirizine (ZYRTEC) 10 MG tablet Take 10 mg by mouth daily.      . cholecalciferol (VITAMIN D) 1000 UNITS tablet Take 1,000 Units by mouth daily.       . citalopram (CELEXA) 10 MG tablet Take 10 mg by mouth daily.      . fluticasone (FLONASE) 50 MCG/ACT nasal spray Place 2 sprays into the nose daily.        Marland Kitchen gabapentin (NEURONTIN) 600 MG tablet Take 900 mg by mouth at bedtime.      . Multiple Vitamin (MULTIVITAMIN) tablet Take 1 tablet by mouth daily.      Marland Kitchen omeprazole (PRILOSEC) 20 MG capsule Take 20 mg by mouth every other day.       . rosuvastatin (CRESTOR) 5 MG tablet Take 1 tablet (5 mg total) by mouth daily.  30 tablet  3   No current facility-administered  medications for this visit.   BP 156/88  Pulse 98  Ht 5\' 8"  (1.727 m)  Wt 90.901 kg (200 lb 6.4 oz)  BMI 30.48 kg/m2 General: NAD Neck: No JVD, no thyromegaly or thyroid nodule.  Lungs: Clear to auscultation bilaterally with normal respiratory effort. CV: Nondisplaced PMI.  Heart regular S1/S2, no S3/S4, no murmur.  No peripheral edema.  No carotid bruit.  Normal pedal pulses.  Abdomen: Soft, nontender, no hepatosplenomegaly, no distention.  Skin: Intact without lesions or rashes.  Neurologic: Alert and oriented x 3.  Psych: Normal affect. Extremities: No clubbing or cyanosis.   Assessment/Plan: 1. CAD: Nonobstructive on LHC in 2007.  No ischemic symptoms.  Continue ASA 81 and statin.  2. Hyperlipidemia: Goal LDL < 70 with known vascular disease.  Pravastatin 20 mg daily has not controlled LDL adequately.  I will have him change to Crestor 5 mg daily with lipids/LFTs in 3 months.  3. Preoperative evaluation: Patient needs to undergo surgery on spinal stenosis.  There is no contraindication to surgery and he may proceed as scheduled.   Loralie Champagne 05/21/2013

## 2013-05-20 NOTE — Telephone Encounter (Signed)
Received request from Nurse fax box, documents faxed for surgical clearance. To: [place] [facility] Fax number: [phone] Attention: 05/20/13/kdm

## 2013-05-20 NOTE — Patient Instructions (Signed)
Start crestor 5mg  daily when you finish your current supply of pravastatin.  Your physician has requested that you regularly monitor and record your blood pressure readings at home. Please use the same machine at the same time of day to check your readings and record them. I will call you in about 1 month to get the readings.   Your physician recommends that you return for a FASTING lipid profile /liver profile  2 months after you start Crestor. (about 3 months).  Your physician wants you to follow-up in: 1 year with Dr Aundra Dubin. (January 2016). You will receive a reminder letter in the mail two months in advance. If you don't receive a letter, please call our office to schedule the follow-up appointment.

## 2013-05-26 ENCOUNTER — Other Ambulatory Visit: Payer: Self-pay | Admitting: Neurological Surgery

## 2013-05-26 MED ORDER — DEXAMETHASONE SODIUM PHOSPHATE 10 MG/ML IJ SOLN
10.0000 mg | INTRAMUSCULAR | Status: AC
  Administered 2013-05-27: 10 mg via INTRAVENOUS
  Filled 2013-05-26: qty 1

## 2013-05-26 MED ORDER — CEFAZOLIN SODIUM-DEXTROSE 2-3 GM-% IV SOLR
2.0000 g | INTRAVENOUS | Status: AC
  Administered 2013-05-27: 2 g via INTRAVENOUS
  Filled 2013-05-26: qty 50

## 2013-05-26 NOTE — Progress Notes (Signed)
Nurse called patient and instructed him to arrive at [employer] instead of 0530. Patient repeated arrival time back to Nurse and verbalized understanding.

## 2013-05-26 NOTE — Progress Notes (Signed)
Surgery time  Changed . Patient called to come in at 1215 for 1413 surgery.

## 2013-05-27 ENCOUNTER — Inpatient Hospital Stay (HOSPITAL_COMMUNITY)
Admission: RE | Admit: 2013-05-27 | Discharge: 2013-05-28 | DRG: 460 | Disposition: A | Discharge status: Home / Self Care | Payer: Medicare FFS | Source: Ambulatory Visit | Attending: Neurological Surgery | Admitting: Neurological Surgery

## 2013-05-27 ENCOUNTER — Encounter (HOSPITAL_COMMUNITY): Payer: Medicare FFS | Admitting: Vascular Surgery

## 2013-05-27 ENCOUNTER — Inpatient Hospital Stay (HOSPITAL_COMMUNITY): Payer: Medicare FFS

## 2013-05-27 ENCOUNTER — Encounter (HOSPITAL_COMMUNITY)
Admission: RE | Disposition: A | Discharge status: Home / Self Care | Payer: Self-pay | Source: Ambulatory Visit | Attending: Neurological Surgery

## 2013-05-27 ENCOUNTER — Inpatient Hospital Stay (HOSPITAL_COMMUNITY): Payer: Medicare FFS | Admitting: Critical Care Medicine

## 2013-05-27 ENCOUNTER — Encounter (HOSPITAL_COMMUNITY): Payer: Self-pay | Admitting: Critical Care Medicine

## 2013-05-27 DIAGNOSIS — Z8249 Family history of ischemic heart disease and other diseases of the circulatory system: Secondary | ICD-10-CM

## 2013-05-27 DIAGNOSIS — Z79899 Other long term (current) drug therapy: Secondary | ICD-10-CM

## 2013-05-27 DIAGNOSIS — Z981 Arthrodesis status: Secondary | ICD-10-CM

## 2013-05-27 DIAGNOSIS — G4733 Obstructive sleep apnea (adult) (pediatric): Secondary | ICD-10-CM | POA: Diagnosis present

## 2013-05-27 DIAGNOSIS — Z836 Family history of other diseases of the respiratory system: Secondary | ICD-10-CM

## 2013-05-27 DIAGNOSIS — I2510 Atherosclerotic heart disease of native coronary artery without angina pectoris: Secondary | ICD-10-CM | POA: Diagnosis present

## 2013-05-27 DIAGNOSIS — K219 Gastro-esophageal reflux disease without esophagitis: Secondary | ICD-10-CM | POA: Diagnosis present

## 2013-05-27 DIAGNOSIS — Q762 Congenital spondylolisthesis: Principal | ICD-10-CM

## 2013-05-27 DIAGNOSIS — N400 Enlarged prostate without lower urinary tract symptoms: Secondary | ICD-10-CM | POA: Diagnosis present

## 2013-05-27 DIAGNOSIS — Z7982 Long term (current) use of aspirin: Secondary | ICD-10-CM

## 2013-05-27 DIAGNOSIS — F3289 Other specified depressive episodes: Secondary | ICD-10-CM | POA: Diagnosis present

## 2013-05-27 DIAGNOSIS — E785 Hyperlipidemia, unspecified: Secondary | ICD-10-CM | POA: Diagnosis present

## 2013-05-27 DIAGNOSIS — F329 Major depressive disorder, single episode, unspecified: Secondary | ICD-10-CM | POA: Diagnosis present

## 2013-05-27 HISTORY — PX: MAXIMUM ACCESS (MAS)POSTERIOR LUMBAR INTERBODY FUSION (PLIF) 1 LEVEL: SHX6368

## 2013-05-27 LAB — ABO/RH: ABO/RH(D): O POS

## 2013-05-27 LAB — TYPE AND SCREEN
ABO/RH(D): O POS
Antibody Screen: NEGATIVE

## 2013-05-27 SURGERY — FOR MAXIMUM ACCESS (MAS) POSTERIOR LUMBAR INTERBODY FUSION (PLIF) 1 LEVEL
Anesthesia: General | Site: Spine Lumbar

## 2013-05-27 MED ORDER — SODIUM CHLORIDE 0.9 % IJ SOLN: 3.0000 mL | INTRAMUSCULAR | Status: DC | PRN

## 2013-05-27 MED ORDER — PHENOL 1.4 % MT LIQD: 1.0000 | OROMUCOSAL | Status: DC | PRN

## 2013-05-27 MED ORDER — MIDAZOLAM HCL 5 MG/5ML IJ SOLN
INTRAMUSCULAR | Status: DC | PRN
  Administered 2013-05-27: 2 mg via INTRAVENOUS

## 2013-05-27 MED ORDER — FENTANYL CITRATE 0.05 MG/ML IJ SOLN
INTRAMUSCULAR | Status: AC
  Filled 2013-05-27: qty 2

## 2013-05-27 MED ORDER — MENTHOL 3 MG MT LOZG
1.0000 | LOZENGE | OROMUCOSAL | Status: DC | PRN
  Administered 2013-05-28: 3 mg via ORAL
  Filled 2013-05-27: qty 9

## 2013-05-27 MED ORDER — SODIUM CHLORIDE 0.9 % IJ SOLN
3.0000 mL | Freq: Two times a day (BID) | INTRAMUSCULAR | Status: DC
  Administered 2013-05-27: 3 mL via INTRAVENOUS

## 2013-05-27 MED ORDER — MORPHINE SULFATE 2 MG/ML IJ SOLN: 1.0000 mg | INTRAMUSCULAR | Status: DC | PRN

## 2013-05-27 MED ORDER — ONDANSETRON HCL 4 MG/2ML IJ SOLN
INTRAMUSCULAR | Status: DC | PRN
  Administered 2013-05-27: 4 mg via INTRAVENOUS

## 2013-05-27 MED ORDER — PROPOFOL 10 MG/ML IV BOLUS
INTRAVENOUS | Status: DC | PRN
  Administered 2013-05-27: 20 mg via INTRAVENOUS
  Administered 2013-05-27: 180 mg via INTRAVENOUS

## 2013-05-27 MED ORDER — FENTANYL CITRATE 0.05 MG/ML IJ SOLN
25.0000 ug | INTRAMUSCULAR | Status: DC | PRN
  Administered 2013-05-27: 25 ug via INTRAVENOUS

## 2013-05-27 MED ORDER — CEFAZOLIN SODIUM 1-5 GM-% IV SOLN
1.0000 g | Freq: Three times a day (TID) | INTRAVENOUS | Status: AC
  Administered 2013-05-27 – 2013-05-28 (×2): 1 g via INTRAVENOUS
  Filled 2013-05-27 (×3): qty 50

## 2013-05-27 MED ORDER — BUPIVACAINE HCL (PF) 0.25 % IJ SOLN
INTRAMUSCULAR | Status: DC | PRN
  Administered 2013-05-27: 5 mL

## 2013-05-27 MED ORDER — CELECOXIB 200 MG PO CAPS
200.0000 mg | ORAL_CAPSULE | Freq: Two times a day (BID) | ORAL | Status: DC
Start: 2013-05-27 — End: 2013-05-28
  Administered 2013-05-27: 200 mg via ORAL
  Filled 2013-05-27 (×3): qty 1

## 2013-05-27 MED ORDER — LACTATED RINGERS IV SOLN
INTRAVENOUS | Status: DC
  Administered 2013-05-27 (×2): via INTRAVENOUS

## 2013-05-27 MED ORDER — CITALOPRAM HYDROBROMIDE 10 MG PO TABS
10.0000 mg | ORAL_TABLET | Freq: Every day | ORAL | Status: DC
  Filled 2013-05-27: qty 1

## 2013-05-27 MED ORDER — SUCCINYLCHOLINE CHLORIDE 20 MG/ML IJ SOLN
INTRAMUSCULAR | Status: DC | PRN
  Administered 2013-05-27: 160 mg via INTRAVENOUS

## 2013-05-27 MED ORDER — 0.9 % SODIUM CHLORIDE (POUR BTL) OPTIME
TOPICAL | Status: DC | PRN
  Administered 2013-05-27: 1000 mL

## 2013-05-27 MED ORDER — METHOCARBAMOL 500 MG PO TABS
500.0000 mg | ORAL_TABLET | Freq: Four times a day (QID) | ORAL | Status: DC | PRN
  Administered 2013-05-27 – 2013-05-28 (×3): 500 mg via ORAL
  Filled 2013-05-27 (×3): qty 1

## 2013-05-27 MED ORDER — ONDANSETRON HCL 4 MG/2ML IJ SOLN: 4.0000 mg | INTRAMUSCULAR | Status: DC | PRN

## 2013-05-27 MED ORDER — ACETAMINOPHEN 325 MG PO TABS
650.0000 mg | ORAL_TABLET | ORAL | Status: DC | PRN
Start: 2013-05-27 — End: 2013-05-28

## 2013-05-27 MED ORDER — PHENYLEPHRINE HCL 10 MG/ML IJ SOLN
10.0000 mg | INTRAVENOUS | Status: DC | PRN
  Administered 2013-05-27: 25 ug/min via INTRAVENOUS

## 2013-05-27 MED ORDER — DEXTROSE 5 % IV SOLN
500.0000 mg | Freq: Four times a day (QID) | INTRAVENOUS | Status: DC | PRN
  Filled 2013-05-27: qty 5

## 2013-05-27 MED ORDER — GLYCOPYRROLATE 0.2 MG/ML IJ SOLN
INTRAMUSCULAR | Status: DC | PRN
  Administered 2013-05-27 (×2): 0.1 mg via INTRAVENOUS

## 2013-05-27 MED ORDER — ACETAMINOPHEN 650 MG RE SUPP: 650.0000 mg | RECTAL | Status: DC | PRN

## 2013-05-27 MED ORDER — SODIUM CHLORIDE 0.9 % IR SOLN
Status: DC | PRN
  Administered 2013-05-27: 15:00:00

## 2013-05-27 MED ORDER — OXYCODONE-ACETAMINOPHEN 5-325 MG PO TABS
1.0000 | ORAL_TABLET | ORAL | Status: DC | PRN
  Administered 2013-05-27: 2 via ORAL
  Administered 2013-05-28 (×2): 1 via ORAL
  Filled 2013-05-27: qty 1
  Filled 2013-05-27: qty 2
  Filled 2013-05-27: qty 1

## 2013-05-27 MED ORDER — DEXAMETHASONE SODIUM PHOSPHATE 4 MG/ML IJ SOLN
4.0000 mg | Freq: Four times a day (QID) | INTRAMUSCULAR | Status: DC
  Administered 2013-05-27 – 2013-05-28 (×2): 4 mg via INTRAVENOUS
  Filled 2013-05-27 (×6): qty 1

## 2013-05-27 MED ORDER — SODIUM CHLORIDE 0.9 % IV SOLN: 250.0000 mL | INTRAVENOUS | Status: DC

## 2013-05-27 MED ORDER — POTASSIUM CHLORIDE IN NACL 20-0.9 MEQ/L-% IV SOLN
INTRAVENOUS | Status: DC
  Administered 2013-05-27: 20:00:00 via INTRAVENOUS
  Filled 2013-05-27 (×3): qty 1000

## 2013-05-27 MED ORDER — SURGIFOAM 100 EX MISC
CUTANEOUS | Status: DC | PRN
  Administered 2013-05-27: 15:00:00 via TOPICAL

## 2013-05-27 MED ORDER — THROMBIN 5000 UNITS EX SOLR
OROMUCOSAL | Status: DC | PRN
  Administered 2013-05-27: 15:00:00 via TOPICAL

## 2013-05-27 MED ORDER — GABAPENTIN 300 MG PO CAPS
900.0000 mg | ORAL_CAPSULE | Freq: Every day | ORAL | Status: DC
  Administered 2013-05-27: 900 mg via ORAL
  Filled 2013-05-27 (×2): qty 3

## 2013-05-27 MED ORDER — FENTANYL CITRATE 0.05 MG/ML IJ SOLN
INTRAMUSCULAR | Status: DC | PRN
  Administered 2013-05-27 (×3): 50 ug via INTRAVENOUS
  Administered 2013-05-27: 150 ug via INTRAVENOUS

## 2013-05-27 MED ORDER — LIDOCAINE HCL (CARDIAC) 20 MG/ML IV SOLN
INTRAVENOUS | Status: DC | PRN
  Administered 2013-05-27: 70 mg via INTRAVENOUS

## 2013-05-27 MED ORDER — KETOROLAC TROMETHAMINE 30 MG/ML IJ SOLN
INTRAMUSCULAR | Status: DC | PRN
  Administered 2013-05-27: 30 mg via INTRAVENOUS

## 2013-05-27 MED ORDER — DEXAMETHASONE 4 MG PO TABS
4.0000 mg | ORAL_TABLET | Freq: Four times a day (QID) | ORAL | Status: DC
  Administered 2013-05-28 (×2): 4 mg via ORAL
  Filled 2013-05-27 (×6): qty 1

## 2013-05-27 MED ORDER — ARTIFICIAL TEARS OP OINT
TOPICAL_OINTMENT | OPHTHALMIC | Status: DC | PRN
  Administered 2013-05-27: 1 via OPHTHALMIC

## 2013-05-27 SURGICAL SUPPLY — 71 items
APL SKNCLS STERI-STRIP NONHPOA (GAUZE/BANDAGES/DRESSINGS) ×1
BAG DECANTER FOR FLEXI CONT (MISCELLANEOUS) ×2 IMPLANT
BENZOIN TINCTURE PRP APPL 2/3 (GAUZE/BANDAGES/DRESSINGS) ×2 IMPLANT
BLADE SURG ROTATE 9660 (MISCELLANEOUS) ×1 IMPLANT
BONE MATRIX OSTEOCEL PRO MED (Bone Implant) ×1 IMPLANT
BUR MATCHSTICK NEURO 3.0 LAGG (BURR) ×2 IMPLANT
CAGE PLIF MAS 9X13X28 LUMBAR (Cage) ×2 IMPLANT
CANISTER SUCT 3000ML (MISCELLANEOUS) ×2 IMPLANT
CLIP NEUROVISION LG (CLIP) ×1 IMPLANT
CONT SPEC 4OZ CLIKSEAL STRL BL (MISCELLANEOUS) ×4 IMPLANT
COVER BACK TABLE 24X17X13 BIG (DRAPES) IMPLANT
COVER TABLE BACK 60X90 (DRAPES) ×2 IMPLANT
DRAPE C-ARM 42X72 X-RAY (DRAPES) ×2 IMPLANT
DRAPE C-ARMOR (DRAPES) ×2 IMPLANT
DRAPE LAPAROTOMY 100X72X124 (DRAPES) ×2 IMPLANT
DRAPE POUCH INSTRU U-SHP 10X18 (DRAPES) ×2 IMPLANT
DRAPE SURG 17X23 STRL (DRAPES) ×2 IMPLANT
DRESSING TELFA 8X3 (GAUZE/BANDAGES/DRESSINGS) ×2 IMPLANT
DRSG OPSITE 4X5.5 SM (GAUZE/BANDAGES/DRESSINGS) ×3 IMPLANT
DRSG OPSITE POSTOP 4X6 (GAUZE/BANDAGES/DRESSINGS) ×1 IMPLANT
DURAPREP 26ML APPLICATOR (WOUND CARE) ×2 IMPLANT
ELECT REM PT RETURN 9FT ADLT (ELECTROSURGICAL) ×2
ELECTRODE REM PT RTRN 9FT ADLT (ELECTROSURGICAL) ×1 IMPLANT
EVACUATOR 1/8 PVC DRAIN (DRAIN) ×2 IMPLANT
GAUZE SPONGE 4X4 16PLY XRAY LF (GAUZE/BANDAGES/DRESSINGS) IMPLANT
GLOVE BIO SURGEON STRL SZ8 (GLOVE) ×2 IMPLANT
GLOVE BIOGEL PI IND STRL 6.5 (GLOVE) IMPLANT
GLOVE BIOGEL PI IND STRL 7.5 (GLOVE) IMPLANT
GLOVE BIOGEL PI IND STRL 8 (GLOVE) IMPLANT
GLOVE BIOGEL PI INDICATOR 6.5 (GLOVE) ×1
GLOVE BIOGEL PI INDICATOR 7.5 (GLOVE) ×1
GLOVE BIOGEL PI INDICATOR 8 (GLOVE) ×1
GLOVE ECLIPSE 7.5 STRL STRAW (GLOVE) ×1 IMPLANT
GLOVE SURG SS PI 6.5 STRL IVOR (GLOVE) ×1 IMPLANT
GLOVE SURG SS PI 7.0 STRL IVOR (GLOVE) ×4 IMPLANT
GOWN BRE IMP SLV AUR LG STRL (GOWN DISPOSABLE) ×2 IMPLANT
GOWN BRE IMP SLV AUR XL STRL (GOWN DISPOSABLE) ×3 IMPLANT
GOWN STRL REIN 2XL LVL4 (GOWN DISPOSABLE) IMPLANT
HEMOSTAT POWDER KIT SURGIFOAM (HEMOSTASIS) ×1 IMPLANT
KIT BASIN OR (CUSTOM PROCEDURE TRAY) ×2 IMPLANT
KIT NDL NVM5 EMG ELECT (KITS) IMPLANT
KIT NEEDLE NVM5 EMG ELECT (KITS) ×1 IMPLANT
KIT NEEDLE NVM5 EMG ELECTRODE (KITS) ×1
KIT ROOM TURNOVER OR (KITS) ×2 IMPLANT
MILL MEDIUM DISP (BLADE) ×1 IMPLANT
NDL HYPO 25X1 1.5 SAFETY (NEEDLE) ×1 IMPLANT
NEEDLE HYPO 25X1 1.5 SAFETY (NEEDLE) ×2 IMPLANT
NS IRRIG 1000ML POUR BTL (IV SOLUTION) ×2 IMPLANT
PACK LAMINECTOMY NEURO (CUSTOM PROCEDURE TRAY) ×2 IMPLANT
PAD ARMBOARD 7.5X6 YLW CONV (MISCELLANEOUS) ×8 IMPLANT
ROD 5.5X40MM (Rod) ×2 IMPLANT
SCREW LOCK (Screw) ×8 IMPLANT
SCREW LOCK FXNS SPNE MAS PL (Screw) IMPLANT
SCREW PLIF MAS 5.0X35 (Screw) ×2 IMPLANT
SCREW SHANK 5.0X30MM (Screw) ×2 IMPLANT
SCREW TULIP 5.5 (Screw) ×2 IMPLANT
SLEEVE SURGEON STRL (DRAPES) ×1 IMPLANT
SPONGE LAP 4X18 X RAY DECT (DISPOSABLE) IMPLANT
SPONGE SURGIFOAM ABS GEL 100 (HEMOSTASIS) ×2 IMPLANT
STRIP CLOSURE SKIN 1/2X4 (GAUZE/BANDAGES/DRESSINGS) ×4 IMPLANT
SUT VIC AB 0 CT1 18XCR BRD8 (SUTURE) ×1 IMPLANT
SUT VIC AB 0 CT1 8-18 (SUTURE) ×2
SUT VIC AB 2-0 CP2 18 (SUTURE) ×2 IMPLANT
SUT VIC AB 3-0 SH 8-18 (SUTURE) ×3 IMPLANT
SYR 20ML ECCENTRIC (SYRINGE) ×2 IMPLANT
SYR 3ML LL SCALE MARK (SYRINGE) IMPLANT
TAPE STRIPS DRAPE STRL (GAUZE/BANDAGES/DRESSINGS) ×1 IMPLANT
TOWEL OR 17X24 6PK STRL BLUE (TOWEL DISPOSABLE) ×2 IMPLANT
TOWEL OR 17X26 10 PK STRL BLUE (TOWEL DISPOSABLE) ×2 IMPLANT
TRAY FOLEY CATH 14FRSI W/METER (CATHETERS) ×1 IMPLANT
WATER STERILE IRR 1000ML POUR (IV SOLUTION) ×2 IMPLANT

## 2013-05-27 NOTE — H&P (Signed)
Subjective: Patient is a 68 y.o. male admitted for PLIF L4-5. Onset of symptoms was several months ago, gradually worsening since that time.  The pain is rated severe, and is located at the across the lower back and radiates to legs. The pain is described as aching and occurs all day. The symptoms have been progressive. Symptoms are exacerbated by exercise. MRI or CT showed stenosis with instability L4-5.   Past Medical History  Diagnosis Date  . HYPERLIPIDEMIA 09/13/2008  . THROMBOCYTOPENIA 09/13/2008  . DEPRESSION 09/13/2008  . OBSTRUCTIVE SLEEP APNEA 10/23/2009    cpap  . CAD, NATIVE VESSEL 10/23/2009  . GERD 09/13/2008  . False positive stress test     history of, suggestive of inferior ischemia  . Benign prostatic hypertrophy     possible  . Spinal stenosis of lumbar region 08/18/2012  . Allergy   . RBBB 10/26/2009    [place]    Past Surgical History  Procedure Laterality Date  . Tonsillectomy    . Nasal polyp excision    . Colonoscopy    . Cardiac catheterization      10 yrs ago    Prior to Admission medications   Medication Sig Start Date End Date Taking? Authorizing Provider  aspirin 81 MG EC tablet Take 81 mg by mouth daily.     Yes Historical Provider, MD  cetirizine (ZYRTEC) 10 MG tablet Take 10 mg by mouth daily.   Yes Historical Provider, MD  cholecalciferol (VITAMIN D) 1000 UNITS tablet Take 1,000 Units by mouth daily.    Yes Historical Provider, MD  citalopram (CELEXA) 10 MG tablet Take 10 mg by mouth daily.   Yes Historical Provider, MD  fluticasone (FLONASE) 50 MCG/ACT nasal spray Place 2 sprays into the nose daily.     Yes Historical Provider, MD  gabapentin (NEURONTIN) 600 MG tablet Take 900 mg by mouth at bedtime.   Yes Historical Provider, MD  Multiple Vitamin (MULTIVITAMIN) tablet Take 1 tablet by mouth daily.   Yes Historical Provider, MD  omeprazole (PRILOSEC) 20 MG capsule Take 20 mg by mouth every other day.    Yes Historical Provider, MD  rosuvastatin (CRESTOR)  5 MG tablet Take 1 tablet (5 mg total) by mouth daily. 05/20/13  Yes Larey Dresser, MD   Allergies  Allergen Reactions  . Adhesive [Tape] Rash  . Codeine Sulfate Other (See Comments)    urination problems  . Latex Rash    History  Substance Use Topics  . Smoking status: Never Smoker   . Smokeless tobacco: Never Used  . Alcohol Use: 3.0 oz/week    5 Glasses of wine per week     Comment: daily    Family History  Problem Relation Age of Onset  . Heart disease Mother 62    CHF  . Heart disease Father   . COPD Father   . Hypertension Sister   . Hyperlipidemia Sister   . Colon cancer Neg Hx   . Stomach cancer Neg Hx      Review of Systems  Positive ROS: neg  All other systems have been reviewed and were otherwise negative with the exception of those mentioned in the HPI and as above.  Objective: Vital signs in last 24 hours: Temp:  [97.5 F (36.4 C)] 97.5 F (36.4 C) (01/15 1214) Pulse Rate:  [75] 75 (01/15 1214) Resp:  [18] 18 (01/15 1214) BP: (142)/(77) 142/77 mmHg (01/15 1214) SpO2:  [100 %] 100 % (01/15 1214)  General Appearance:  Alert, cooperative, no distress, appears stated age Head: Normocephalic, without obvious abnormality, atraumatic Eyes: PERRL, conjunctiva/corneas clear, EOM's intact    Neck: Supple, symmetrical, trachea midline Back: Symmetric, no curvature, ROM normal, no CVA tenderness Lungs:  respirations unlabored Heart: Regular rate and rhythm Abdomen: Soft, non-tender Extremities: Extremities normal, atraumatic, no cyanosis or edema Pulses: 2+ and symmetric all extremities Skin: Skin color, texture, turgor normal, no rashes or lesions  NEUROLOGIC:   Mental status: Alert and oriented x4,  no aphasia, good attention span, fund of knowledge, and memory Motor Exam - grossly normal Sensory Exam - grossly normal Reflexes: 1+ Coordination - grossly normal Gait - grossly normal Balance - grossly normal Cranial Nerves: I: smell Not tested  II:  visual acuity  OS: nl    OD: nl  II: visual fields Full to confrontation  II: pupils Equal, round, reactive to light  III,VII: ptosis None  III,IV,VI: extraocular muscles  Full ROM  V: mastication Normal  V: facial light touch sensation  Normal  V,VII: corneal reflex  Present  VII: facial muscle function - upper  Normal  VII: facial muscle function - lower Normal  VIII: hearing Not tested  IX: soft palate elevation  Normal  IX,X: gag reflex Present  XI: trapezius strength  5/5  XI: sternocleidomastoid strength 5/5  XI: neck flexion strength  5/5  XII: tongue strength  Normal    Data Review Lab Results  Component Value Date   WBC 8.0 05/17/2013   HGB 14.5 05/17/2013   HCT 41.7 05/17/2013   MCV 89.1 05/17/2013   PLT 150 05/17/2013   Lab Results  Component Value Date   NA 141 05/17/2013   K 4.3 05/17/2013   CL 101 05/17/2013   CO2 27 05/17/2013   BUN 13 05/17/2013   CREATININE 0.87 05/17/2013   GLUCOSE 117* 05/17/2013   Lab Results  Component Value Date   INR 0.88 05/17/2013    Assessment/Plan: Patient admitted for PLIF L4-5. Patient has failed a reasonable attempt at conservative therapy.  I explained the condition and procedure to the patient and answered any questions.  Patient wishes to proceed with procedure as planned. Understands risks/ benefits and typical outcomes of procedure.   Javier Mamone S 05/27/2013 1:27 PM

## 2013-05-27 NOTE — Preoperative (Signed)
Beta Blockers   Reason not to administer Beta Blockers:Not Applicable 

## 2013-05-27 NOTE — Anesthesia Postprocedure Evaluation (Signed)
  Anesthesia Post-op Note  Patient: Donald Pope  Procedure(s) Performed: Procedure(s) with comments: FOR MAXIMUM ACCESS (MAS) POSTERIOR LUMBAR INTERBODY FUSION (PLIF) 1 LEVEL ,LUMBAR FOUR-FIVE (N/A) - FOR MAXIMUM ACCESS (MAS) POSTERIOR LUMBAR INTERBODY FUSION (PLIF) 1 LEVEL ,LUMBAR FOUR-FIVE  Patient Location: PACU  Anesthesia Type:General  Level of Consciousness: awake  Airway and Oxygen Therapy: Patient Spontanous Breathing  Post-op Pain: mild  Post-op Assessment: Post-op Vital signs reviewed  Post-op Vital Signs: Reviewed  Complications: No apparent anesthesia complications

## 2013-05-27 NOTE — Anesthesia Procedure Notes (Signed)
Procedure Name: Intubation Date/Time: 05/27/2013 2:08 PM Performed by: Carola Frost Pre-anesthesia Checklist: Patient identified, Timeout performed, Emergency Drugs available, Suction available and Patient being monitored Patient Re-evaluated:Patient Re-evaluated prior to inductionOxygen Delivery Method: Circle system utilized Preoxygenation: Pre-oxygenation with 100% oxygen Intubation Type: IV induction Ventilation: Mask ventilation without difficulty Laryngoscope Size: Mac, 4, Miller and 2 Grade View: Grade III Tube type: Oral Tube size: 7.5 mm Number of attempts: 2 Airway Equipment and Method: Stylet Placement Confirmation: positive ETCO2,  ETT inserted through vocal cords under direct vision and breath sounds checked- equal and bilateral Secured at: 24 cm Tube secured with: Tape Dental Injury: Bloody posterior oropharynx  Comments: DLx1 with MAC4 blade unable to lift epiglottis to obtain view, DLx1 with miller 2 - grade 3 view due to short blade - recommend miller 3

## 2013-05-27 NOTE — Transfer of Care (Signed)
Immediate Anesthesia Transfer of Care Note  Patient: Donald Pope  Procedure(s) Performed: Procedure(s) with comments: FOR MAXIMUM ACCESS (MAS) POSTERIOR LUMBAR INTERBODY FUSION (PLIF) 1 LEVEL ,LUMBAR FOUR-FIVE (N/A) - FOR MAXIMUM ACCESS (MAS) POSTERIOR LUMBAR INTERBODY FUSION (PLIF) 1 LEVEL ,LUMBAR FOUR-FIVE  Patient Location: PACU  Anesthesia Type:General  Level of Consciousness: awake, sedated and confused  Airway & Oxygen Therapy: Patient Spontanous Breathing and Patient connected to face mask oxygen  Post-op Assessment: Report given to PACU RN, Post -op Vital signs reviewed and stable, Patient moving all extremities and Patient moving all extremities X 4  Post vital signs: Reviewed and stable  Complications: No apparent anesthesia complications

## 2013-05-27 NOTE — Anesthesia Preprocedure Evaluation (Addendum)
Anesthesia Evaluation  Patient identified by MRN, date of birth, ID band Patient awake    Reviewed: Allergy & Precautions, H&P , NPO status , Patient's Chart, lab work & pertinent test results  Airway Mallampati: III TM Distance: >3 FB Neck ROM: Limited    Dental  (+) Dental Advisory Given and Teeth Intact   Pulmonary sleep apnea and Continuous Positive Airway Pressure Ventilation ,  breath sounds clear to auscultation        Cardiovascular + CAD + dysrhythmias Rhythm:Regular Rate:Normal     Neuro/Psych PSYCHIATRIC DISORDERS Depression    GI/Hepatic GERD-  Medicated,  Endo/Other    Renal/GU      Musculoskeletal   Abdominal   Peds  Hematology   Anesthesia Other Findings   Reproductive/Obstetrics                         Anesthesia Physical Anesthesia Plan  ASA: III  Anesthesia Plan: General   Post-op Pain Management:    Induction: Intravenous  Airway Management Planned: Oral ETT  Additional Equipment:   Intra-op Plan:   Post-operative Plan: Extubation in OR  Informed Consent: I have reviewed the patients History and Physical, chart, labs and discussed the procedure including the risks, benefits and alternatives for the proposed anesthesia with the patient or authorized representative who has indicated his/her understanding and acceptance.   Dental advisory given  Plan Discussed with: Anesthesiologist, Surgeon and CRNA  Anesthesia Plan Comments:        Anesthesia Quick Evaluation

## 2013-05-27 NOTE — Op Note (Signed)
05/27/2013  5:13 PM  PATIENT:  [name]  68 y.o. 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 Decompressive lumbar laminectomy L4-5 requiring more work than would be required for a simple exposure of the disk for PLIF in order to adequately decompress the neural elements and address the spinal stenosis 2. Posterior lumbar interbody fusion L4-5 using PEEK interbody cages packed with morcellized allograft and autograft 3. Posterior fixation L4-5 using cortical pedicle screws.    SURGEON:  Sherley Bounds, MD  ASSISTANTS: Dr. Sherwood Gambler  ANESTHESIA:  General  EBL: 100 ml  Total I/O In: 1000 [I.V.:1000] Out: 250 [Urine:150; Blood:100]  BLOOD ADMINISTERED:none  DRAINS: Hemovac   INDICATION FOR PROCEDURE: This patient presented with a long history of back and leg pain. MRI showed severe spinal stenosis L4-5 spondylolisthesis. He tried medical management without relief. Recommended lumbar decompression and instrument effusion L4-5. Patient understood the risks, benefits, and alternatives and potential outcomes and wished to proceed.  PROCEDURE DETAILS:  The patient was brought to the operating room. After induction of generalized endotracheal anesthesia the patient was rolled into the prone position on chest rolls and all pressure points were padded. The patient's lumbar region was cleaned and then prepped with DuraPrep and draped in the usual sterile fashion. Anesthesia was injected and then a dorsal midline incision was made and carried down to the lumbosacral fascia. The fascia was opened and the paraspinous musculature was taken down in a subperiosteal fashion to expose L4-5. A self-retaining retractor was placed. Intraoperative fluoroscopy confirmed my level, and I started with placement of the L4 cortical pedicle screws. The pedicle screw entry zones  were identified utilizing surface landmarks and  AP and lateral fluoroscopy. I scored the cortex with the high-speed drill and then used the hand drill and EMG monitoring to drill an upward and outward direction into the pedicle. I then tapped line to line, and the tap was also monitored. I then placed a 5-0 x 30 mm cortical pedicle screw into the pedicles of L4 bilaterally. I then turned my attention to the decompression and the spinous process was removed and complete lumbar laminectomies, hemi- facetectomies, and foraminotomies were performed at L4-5. The patient had significant spinal stenosis and this required more work than would be required for a simple exposure of the disc for posterior lumbar interbody fusion. Much more generous decompression was undertaken in order to adequately decompress the neural elements and address the patient's leg pain. The yellow ligament was removed to expose the underlying dura and nerve roots, and generous foraminotomies were performed to adequately decompress the neural elements. Both the exiting and traversing nerve roots were decompressed on both sides until a coronary dilator passed easily along the nerve roots. Once the decompression was complete, I turned my attention to the posterior lower lumbar interbody fusion. The epidural venous vasculature was coagulated and cut sharply. Disc space was incised and the initial discectomy was performed with pituitary rongeurs. The disc space was distracted with sequential distractors to a height of 15 mm. We then used a series of scrapers and shavers to prepare the endplates for fusion. The midline was prepared with Epstein curettes. Once the complete discectomy was finished, we packed an appropriate sized peek interbody cage with local autograft and morcellized allograft, gently retracted the nerve root, and tapped the cage into position at L4-5.  The midline between the cages  was packed with morselized autograft and allograft. We  then turned our attention to the placement of the lower pedicle screws. The pedicle screw entry zones were identified utilizing surface landmarks and fluoroscopy. I drilled into each pedicle utilizing the hand drill and EMG monitoring, and tapped each pedicle with the appropriate tap. We palpated with a ball probe to assure no break in the cortex. We then placed 5-0 by 30 mm cortical screws into the pedicles bilaterally at L5.  We then placed lordotic rods into the multiaxial screw heads of the pedicle screws and locked these in position with the locking caps and anti-torque device. We then checked our construct with AP and lateral fluoroscopy. Irrigated with copious amounts of bacitracin-containing saline solution. Placed a medium Hemovac drain through separate stab incision. Inspected the nerve roots once again to assure adequate decompression, lined to the dura with Gelfoam, and closed the muscle and the fascia with 0 Vicryl. Closed the subcutaneous tissues with 2-0 Vicryl and subcuticular tissues with 3-0 Vicryl. The skin was closed with benzoin and Steri-Strips. Dressing was then applied, the patient was awakened from general anesthesia and transported to the recovery room in stable condition. At the end of the procedure all sponge, needle and instrument counts were correct.   PLAN OF CARE: Admit to inpatient   PATIENT DISPOSITION:  PACU - hemodynamically stable.   Delay start of Pharmacological VTE agent (>24hrs) due to surgical blood loss or risk of bleeding:  yes

## 2013-05-28 MED ORDER — METHOCARBAMOL 500 MG PO TABS: 500.0000 mg | ORAL_TABLET | Freq: Four times a day (QID) | ORAL | Status: DC | PRN

## 2013-05-28 MED ORDER — OXYCODONE-ACETAMINOPHEN 5-325 MG PO TABS: 1.0000 | ORAL_TABLET | ORAL | Status: DC | PRN

## 2013-05-28 NOTE — Progress Notes (Signed)
Occupational Therapy Evaluation Patient Details Name: UNIQUE SEARFOSS MRN: [id number] DOB: [birth year] Today's Date: 05/28/2013 Time: 1027-1050 OT Time Calculation (min): 23 min  OT Assessment / Plan / Recommendation History of present illness PLIF L45 on 1/15   Clinical Impression   Completed all education regarding back precautions and ADL, DME and AE s/p back surgery. Given handouts. Pt demonstrated/vebalized understanding. Wife will assist as needed . Pt is ready for D/C.    OT Assessment  Patient does not need any further OT services    Follow Up Recommendations  No OT follow up    Barriers to Discharge      Equipment Recommendations  None recommended by OT    Recommendations for Other Services    Frequency       Precautions / Restrictions Precautions Precautions: Back;Fall Precaution Booklet Issued: Yes (comment) Required Braces or Orthoses: Spinal Brace Spinal Brace: Lumbar corset;Applied in sitting position Restrictions Weight Bearing Restrictions: No   Pertinent Vitals/Pain no apparent distress     ADL  Grooming: Supervision/safety Upper Body Bathing: Supervision/safety Lower Body Bathing: Minimal assistance Where Assessed - Lower Body Bathing: Unsupported sit to stand Upper Body Dressing: Supervision/safety Where Assessed - Upper Body Dressing: Unsupported sitting Lower Body Dressing: Minimal assistance Where Assessed - Lower Body Dressing: Unsupported sit to stand Toilet Transfer: Supervision/safety Toilet Transfer Method: Sit to Loss adjuster, chartered: Comfort height toilet Toileting - Clothing Manipulation and Hygiene: Minimal assistance;Simulated Where Assessed - Best boy and Hygiene: Sit to stand from 3-in-1 or toilet Tub/Shower Transfer: Other (comment) (educated on technique) Equipment Used: Back brace;Long-handled shoe horn;Long-handled sponge;Reacher;Sock aid Transfers/Ambulation Related to ADLs: S ADL Comments:  educated pt/wife on back precautions for ADL;AE and DME. All questions ansered    OT Diagnosis:    OT Problem List:   OT Treatment Interventions:     OT Goals(Current goals can be found in the care plan section) Acute Rehab OT Goals Patient Stated Goal: return home OT Goal Formulation:  (eval only)  Visit Information  Last OT Received On: 05/28/13 Assistance Needed: +1 History of Present Illness: PLIF L45 on 1/15       Prior [place] expects to be discharged to:: Private residence Living Arrangements: Spouse/significant other Available Help at Discharge: Family Type of Home: House Home Access: Level entry [place]: One [place]: Civil engineer, contracting - built in Prior Function Level of Independence: Independent Communication Communication: No difficulties         Vision/Perception     Solicitor Arousal/Alertness: Awake/alert Behavior During Therapy: WFL for tasks assessed/performed Overall Cognitive Status: Within Functional Limits for tasks assessed    Extremity/Trunk Assessment Upper Extremity Assessment Upper Extremity Assessment: Overall WFL for tasks assessed Lower Extremity Assessment Lower Extremity Assessment: Overall WFL for tasks assessed Cervical / Trunk Assessment Cervical / Trunk Assessment: Normal     Mobility Bed Mobility Overal bed mobility: Modified Independent General bed mobility comments: VC for feinforcement of logg roll technique Transfers Overall transfer level: Modified independent Equipment used: None General transfer comment: VC for avoiding trunk bend when standing     Exercise     Balance Balance Overall balance assessment: Modified Independent   End of Session OT - End of Session Equipment Utilized During Treatment: Gait belt;Back brace Activity Tolerance: Patient tolerated treatment well Patient left: with call bell/phone within reach;in chair;with family/visitor  present Nurse Communication: Mobility status  GO     Eri Mcevers,HILLARY 05/28/2013, 12:38 PM  [facility], OTR/L  [phone] 05/28/2013

## 2013-05-28 NOTE — Plan of Care (Signed)
Problem: Consults Goal: Diagnosis - Spinal Surgery Outcome: Completed/Met Date Met:  05/28/13 Thoraco/Lumbar Spine Fusion

## 2013-05-28 NOTE — Discharge Summary (Signed)
Physician Discharge Summary  Patient ID: Donald Pope MRN: [id number] DOB/AGE: 1945/09/01 68 y.o.  Admit date: 05/27/2013 Discharge date: 05/28/2013  Admission Diagnoses: spondylolisthesis/ stenosis L4-5   Discharge Diagnoses: same   Discharged Condition: good  Hospital Course: The patient was admitted on 05/27/2013 and taken to the operating room where the patient underwent PLIF L4-5. The patient tolerated the procedure well and was taken to the recovery room and then to the floor in stable condition. The hospital course was routine. There were no complications. The wound remained clean dry and intact. Pt had appropriate back soreness. No complaints of leg pain or new N/T/W. The patient remained afebrile with stable vital signs, and tolerated a regular diet. The patient continued to increase activities, and pain was well controlled with oral pain medications.   Consults: None  Significant Diagnostic Studies:  Results for orders placed during the hospital encounter of 05/27/13  TYPE AND SCREEN      Result Value Range   ABO/RH(D) O POS     Antibody Screen NEG     Sample Expiration 05/30/2013    ABO/RH      Result Value Range   ABO/RH(D) O POS      Chest 2 View  05/17/2013   CLINICAL DATA:  Pre operative respiratory exam for lumbar spinal stenosis and spondylolisthesis.  EXAM: CHEST - 2 VIEW  COMPARISON:  12/14/2005  FINDINGS: The heart size and mediastinal contours are within normal limits. There is no evidence of pulmonary edema, consolidation, pneumothorax, nodule or pleural fluid. The visualized skeletal structures show degenerative changes of the thoracic spine.  IMPRESSION: No active disease.   Electronically Signed   By: Aletta Edouard M.D.   On: 05/17/2013 17:01   Dg Lumbar Spine 2-3 Views  05/27/2013   CLINICAL DATA:  L4-5 laminectomy and fusion.  EXAM: LUMBAR SPINE - 2-3 VIEW; DG C-ARM 1-60 MIN  COMPARISON:  Plain films lumbar spine 03/29/2013.  FINDINGS: Three  fluoroscopic intraoperative spot views of the lumbar spine are provided in demonstrate postoperative change of L4-5 laminectomy and fusion with pedicle screws, stabilization bars and interbody spacer in place.  IMPRESSION: L4-5 fusion.   Electronically Signed   By: Inge Rise M.D.   On: 05/27/2013 17:13   Dg C-arm 1-60 Min  05/27/2013   CLINICAL DATA:  L4-5 laminectomy and fusion.  EXAM: LUMBAR SPINE - 2-3 VIEW; DG C-ARM 1-60 MIN  COMPARISON:  Plain films lumbar spine 03/29/2013.  FINDINGS: Three fluoroscopic intraoperative spot views of the lumbar spine are provided in demonstrate postoperative change of L4-5 laminectomy and fusion with pedicle screws, stabilization bars and interbody spacer in place.  IMPRESSION: L4-5 fusion.   Electronically Signed   By: Inge Rise M.D.   On: 05/27/2013 17:13    Antibiotics:  Anti-infectives   Start     Dose/Rate Route Frequency Ordered Stop   05/27/13 2200  ceFAZolin (ANCEF) IVPB 1 g/50 mL premix     1 g 100 mL/hr over 30 Minutes Intravenous Every 8 hours 05/27/13 1858 05/28/13 0651   05/27/13 1507  bacitracin 50,000 Units in sodium chloride irrigation 0.9 % 500 mL irrigation  Status:  Discontinued       As needed 05/27/13 1508 05/27/13 1712   05/27/13 0600  ceFAZolin (ANCEF) IVPB 2 g/50 mL premix     2 g 100 mL/hr over 30 Minutes Intravenous On call to O.R. 05/26/13 1403 05/27/13 1421      Discharge Exam: Blood pressure 111/62, pulse 92,  temperature 98.3 F (36.8 C), temperature source Oral, resp. rate 18, SpO2 95.00%. Neurologic: Grossly normal Incision CDI  Discharge Medications:     Medication List         aspirin 81 MG EC tablet  Take 81 mg by mouth daily.     cetirizine 10 MG tablet  Commonly known as:  ZYRTEC  Take 10 mg by mouth daily.     cholecalciferol 1000 UNITS tablet  Commonly known as:  VITAMIN D  Take 1,000 Units by mouth daily.     citalopram 10 MG tablet  Commonly known as:  CELEXA  Take 10 mg by mouth  daily.     fluticasone 50 MCG/ACT nasal spray  Commonly known as:  FLONASE  Place 2 sprays into the nose daily.     gabapentin 600 MG tablet  Commonly known as:  NEURONTIN  Take 900 mg by mouth at bedtime.     methocarbamol 500 MG tablet  Commonly known as:  ROBAXIN  Take 1 tablet (500 mg total) by mouth every 6 (six) hours as needed for muscle spasms.     multivitamin tablet  Take 1 tablet by mouth daily.     omeprazole 20 MG capsule  Commonly known as:  PRILOSEC  Take 20 mg by mouth every other day.     oxyCODONE-acetaminophen 5-325 MG per tablet  Commonly known as:  PERCOCET/ROXICET  Take 1-2 tablets by mouth every 4 (four) hours as needed for moderate pain.     rosuvastatin 5 MG tablet  Commonly known as:  CRESTOR  Take 1 tablet (5 mg total) by mouth daily.        Disposition: home   Final Dx: PLIF L4-5      Discharge Orders   Future Appointments Provider Department Dept Phone   09/06/2013 7:30 AM [facility] [place] Office [phone]   Future Orders Complete By Expires   Call MD for:  difficulty breathing, headache or visual disturbances  As directed    Call MD for:  persistant nausea and vomiting  As directed    Call MD for:  redness, tenderness, or signs of infection (pain, swelling, redness, odor or green/yellow discharge around incision site)  As directed    Call MD for:  severe uncontrolled pain  As directed    Call MD for:  temperature >100.4  As directed    Diet - low sodium heart healthy  As directed    Discharge instructions  As directed    Comments:     May shower, no bending, no twisting, no driving   Increase activity slowly  As directed       Follow-up Information   Follow up with Eustace Mccartt, MD In 2 weeks.   Specialty:  Neurosurgery   Contact information:   [address] [place] [address] [phone]        Signed: Eustace Lemen 05/28/2013, 8:30 AM

## 2013-05-28 NOTE — Progress Notes (Signed)
Pt. Alert and oriented,follows simple instructions, denies pain. Incision area without swelling, redness or S/S of infection. Voiding adequate clear yellow urine. Moving all extremities well and vitals stable and documented. Lumbar surgery notes instructions given to patient and family member for home safety and precautions. Pt. and family stated understanding of instructions given. Pain medication given prior to discharged. 

## 2013-05-28 NOTE — Evaluation (Signed)
Read, reviewed, edited and agree with student's findings and recommendations.  Beyla Loney B. Aibhlinn Kalmar, PT, DPT #[phone]  

## 2013-05-28 NOTE — Evaluation (Signed)
Physical Therapy Evaluation Patient Details Name: Donald Pope MRN: [id number] DOB: 1945-12-28 Today's Date: 05/28/2013 Time: 0935-1000 PT Time Calculation (min): 25 min  PT Assessment / Plan / Recommendation History of Present Illness  PLIF L45 on 1/15  Clinical Impression  Patient is s/p above surgery resulting in the deficits listed below (see PT Problem List). Patient demonstrates and reports that his mobility is near baseline.  Education provided about precautions and activity recommendations.   Patient has excellent caregiver support at home, and will need her supervision during mobility to ensure safety.        PT Assessment  Patent does not need any further PT services    Follow Up Recommendations  Supervision - Intermittent          Equipment Recommendations  None recommended by PT          Precautions / Restrictions Precautions Precautions: Back;Fall Precaution Booklet Issued: Yes (comment) Required Braces or Orthoses: Spinal Brace Spinal Brace: Lumbar corset;Applied in sitting position Restrictions Weight Bearing Restrictions: No   Pertinent Vitals/Pain 5/10 over incision, changed positions to address. On room air throughout sessio.      Mobility  Bed Mobility Overal bed mobility: Modified Independent General bed mobility comments: VC for reinforcement of log roll technique Transfers Overall transfer level: Modified independent Equipment used: None General transfer comment: VC for avoiding trunk bend when standing Ambulation/Gait Ambulation/Gait assistance: Supervision Ambulation Distance (Feet): 250 Feet Assistive device: None Gait Pattern/deviations: Step-through pattern General Gait Details: Reports walking slower today than normal, and that this is likely due to being tentative about being up on feet again.  His gait pattern appears normal and safe throughout.        PT Goals(Current goals can be found in the care plan section) Acute Rehab  PT Goals Patient Stated Goal: return home PT Goal Formulation: With patient Time For Goal Achievement: 06/11/13 Potential to Achieve Goals: Good  Visit Information  Last PT Received On: 05/28/13 Assistance Needed: +1 History of Present Illness: PLIF L45 on 1/15       Prior [place] expects to be discharged to:: Private residence Living Arrangements: Spouse/significant other Available Help at Discharge: Family Type of Home: House Home Access: Level entry [place]: One [place]: None Prior Function Level of Independence: Independent Communication Communication: No difficulties    Cognition  Cognition Arousal/Alertness: Awake/alert Behavior During Therapy: WFL for tasks assessed/performed Overall Cognitive Status: Within Functional Limits for tasks assessed    Extremity/Trunk Assessment Upper Extremity Assessment Upper Extremity Assessment: Defer to OT evaluation Lower Extremity Assessment Lower Extremity Assessment: Overall WFL for tasks assessed   Balance    End of Session PT - End of Session Equipment Utilized During Treatment: Gait belt;Back brace Activity Tolerance: Patient tolerated treatment well Patient left: in chair;with call bell/phone within reach;with family/visitor present Nurse Communication: Mobility status  GP    Cordelia Poche, SPT Pager:  [phone]  Cordelia Poche 05/28/2013, 11:09 AM

## 2013-06-01 ENCOUNTER — Encounter (HOSPITAL_COMMUNITY): Payer: Self-pay | Admitting: Neurological Surgery

## 2013-06-10 ENCOUNTER — Other Ambulatory Visit: Payer: Self-pay | Admitting: Family Medicine

## 2013-06-10 ENCOUNTER — Telehealth: Payer: Self-pay | Admitting: Family Medicine

## 2013-06-10 DIAGNOSIS — G4733 Obstructive sleep apnea (adult) (pediatric): Secondary | ICD-10-CM

## 2013-06-10 NOTE — Telephone Encounter (Signed)
OK to refer.  Has he seen Dr Lucia Gaskins in past?

## 2013-06-10 NOTE — Telephone Encounter (Signed)
Pt is calling to request a referral to see Dr. Lucia Gaskins in order for him to get his CPAP machine and supplies. He states that he tried to schedule an appointment, but they told him that he would need a [employer] referral first. I'll issue as soon as I get our referral. Thank you!

## 2013-06-10 NOTE — Telephone Encounter (Signed)
Referral is ordered and Pt stated that he has saw Dr. Lucia Gaskins a couple years ago.

## 2013-06-14 ENCOUNTER — Other Ambulatory Visit: Payer: Self-pay | Admitting: Neurological Surgery

## 2013-06-14 DIAGNOSIS — M79604 Pain in right leg: Secondary | ICD-10-CM

## 2013-06-14 DIAGNOSIS — M79605 Pain in left leg: Principal | ICD-10-CM

## 2013-06-15 ENCOUNTER — Ambulatory Visit
Admission: RE | Admit: 2013-06-15 | Discharge: 2013-06-15 | Disposition: A | Discharge status: Home / Self Care | Payer: Commercial Managed Care - HMO | Source: Ambulatory Visit | Attending: Neurological Surgery | Admitting: Neurological Surgery

## 2013-06-15 DIAGNOSIS — M79605 Pain in left leg: Principal | ICD-10-CM

## 2013-06-15 DIAGNOSIS — M79604 Pain in right leg: Secondary | ICD-10-CM

## 2013-06-17 ENCOUNTER — Telehealth: Payer: Self-pay | Admitting: *Deleted

## 2013-06-17 NOTE — Telephone Encounter (Signed)
Note copied from 05/21/13 Dr Aundra Dubin office note: BP today is running high. He says that it is not usually elevated.  06/17/13--pt states he has not been checking his BP regularly since office visit with Dr Aundra Dubin in January.  He had back surgery a couple of weeks ago. He remembers his BP at that time was around 130/80. Pt will make an effort to check his BP regularly and call in a couple of weeks with the readings.

## 2013-09-06 ENCOUNTER — Other Ambulatory Visit: Payer: Medicare FFS

## 2013-09-14 ENCOUNTER — Telehealth: Payer: Self-pay | Admitting: Cardiology

## 2013-09-14 NOTE — Telephone Encounter (Signed)
Pt.notified

## 2013-09-14 NOTE — Telephone Encounter (Signed)
LMTCB

## 2013-09-14 NOTE — Telephone Encounter (Signed)
OK for now, no changes

## 2013-09-14 NOTE — Telephone Encounter (Signed)
Patient stated that Dr. Aundra Dubin ask him to record his BP readings. He said he could have done a better job. But listed below are his readings:  06/30/13 - 126/79 07/06/13 - 149/84   07/11/13 - 141/85 07/13/13 - 151/89 07/15/13 - 132/77  08/24/13 - 142/75 09/07/13- 131/60  Please call and advise.

## 2013-09-14 NOTE — Telephone Encounter (Signed)
New message      2-18 @ 8pm 126/79; 2-24 @ 12pm 149/84; 3-1@ 7pm  141/85; 3-3 @ 9:30am 151/89; 3-5 @ 4:54pm 132/77; 4-14 @ 2pm 142/75; 4-28@ 10:30am 131/60

## 2013-09-17 ENCOUNTER — Other Ambulatory Visit: Payer: Self-pay | Admitting: Neurology

## 2013-09-27 ENCOUNTER — Other Ambulatory Visit: Payer: Self-pay | Admitting: Family Medicine

## 2013-09-27 NOTE — Telephone Encounter (Signed)
Refill for 6 months .  Needs office follow up by August or September.

## 2013-09-27 NOTE — Telephone Encounter (Signed)
Would you like to refill for pt?

## 2013-09-29 ENCOUNTER — Other Ambulatory Visit: Payer: Self-pay

## 2013-09-29 MED ORDER — CITALOPRAM HYDROBROMIDE 10 MG PO TABS: ORAL_TABLET | ORAL | Status: DC

## 2013-09-30 ENCOUNTER — Other Ambulatory Visit: Payer: Self-pay

## 2013-11-04 ENCOUNTER — Other Ambulatory Visit: Payer: Self-pay | Admitting: Cardiology

## 2013-12-10 ENCOUNTER — Ambulatory Visit: Payer: Medicare FFS | Admitting: Cardiology

## 2014-01-25 ENCOUNTER — Other Ambulatory Visit (INDEPENDENT_AMBULATORY_CARE_PROVIDER_SITE_OTHER): Payer: Commercial Managed Care - HMO

## 2014-01-25 DIAGNOSIS — Z0000 Encounter for general adult medical examination without abnormal findings: Secondary | ICD-10-CM

## 2014-01-25 LAB — LIPID PANEL
Cholesterol: 169 mg/dL (ref 0–200)
HDL: 32.3 mg/dL — ABNORMAL LOW (ref >39.00)
NonHDL: 136.7
Total CHOL/HDL Ratio: 5
Triglycerides: 250 mg/dL — ABNORMAL HIGH (ref 0.0–149.0)
VLDL: 50 mg/dL — ABNORMAL HIGH (ref 0.0–40.0)

## 2014-01-25 LAB — HEPATIC FUNCTION PANEL
ALT: 25 U/L (ref 0–53)
AST: 30 U/L (ref 0–37)
Albumin: 4.2 g/dL (ref 3.5–5.2)
Alkaline Phosphatase: 58 U/L (ref 39–117)
Bilirubin, Direct: 0.1 mg/dL (ref 0.0–0.3)
Total Bilirubin: 0.7 mg/dL (ref 0.2–1.2)
Total Protein: 7.4 g/dL (ref 6.0–8.3)

## 2014-01-25 LAB — BASIC METABOLIC PANEL
BUN: 16 mg/dL (ref 6–23)
CO2: 29 mEq/L (ref 19–32)
Calcium: 9.6 mg/dL (ref 8.4–10.5)
Chloride: 104 mEq/L (ref 96–112)
Creatinine, Ser: 0.9 mg/dL (ref 0.4–1.5)
GFR: 87.8 mL/min (ref >60.00)
Glucose, Bld: 111 mg/dL — ABNORMAL HIGH (ref 70–99)
Potassium: 5 mEq/L (ref 3.5–5.1)
Sodium: 141 mEq/L (ref 135–145)

## 2014-01-25 LAB — POCT URINALYSIS DIPSTICK
Bilirubin, UA: NEGATIVE
Blood, UA: NEGATIVE
Glucose, UA: NEGATIVE
Ketones, UA: NEGATIVE
Leukocytes, UA: NEGATIVE
Nitrite, UA: NEGATIVE
Protein, UA: NEGATIVE
Spec Grav, UA: 1.015
Urobilinogen, UA: 0.2
pH, UA: 7

## 2014-01-25 LAB — PSA: PSA: 0.4 ng/mL (ref 0.10–4.00)

## 2014-01-25 LAB — TSH: TSH: 1.4 u[IU]/mL (ref 0.35–4.50)

## 2014-01-25 LAB — LDL CHOLESTEROL, DIRECT: Direct LDL: 106.3 mg/dL

## 2014-01-26 LAB — CBC WITH DIFFERENTIAL/PLATELET
Basophils Absolute: 0 10*3/uL (ref 0.0–0.1)
Basophils Relative: 0.2 % (ref 0.0–3.0)
Eosinophils Absolute: 0.1 10*3/uL (ref 0.0–0.7)
Eosinophils Relative: 1.1 % (ref 0.0–5.0)
HCT: 39.9 % (ref 39.0–52.0)
Hemoglobin: 13.3 g/dL (ref 13.0–17.0)
Lymphocytes Relative: 28 % (ref 12.0–46.0)
Lymphs Abs: 1.6 10*3/uL (ref 0.7–4.0)
MCHC: 33.3 g/dL (ref 30.0–36.0)
MCV: 90.9 fl (ref 78.0–100.0)
Monocytes Absolute: 0.3 10*3/uL (ref 0.1–1.0)
Monocytes Relative: 4.6 % (ref 3.0–12.0)
Neutro Abs: 3.9 10*3/uL (ref 1.4–7.7)
Neutrophils Relative %: 66.1 % (ref 43.0–77.0)
Platelets: 131 10*3/uL — ABNORMAL LOW (ref 150.0–400.0)
RBC: 4.39 Mil/uL (ref 4.22–5.81)
RDW: 13.8 % (ref 11.5–15.5)
WBC: 5.8 10*3/uL (ref 4.0–10.5)

## 2014-01-28 ENCOUNTER — Encounter: Payer: Self-pay | Admitting: Family Medicine

## 2014-01-28 ENCOUNTER — Other Ambulatory Visit: Payer: Self-pay | Admitting: Family Medicine

## 2014-01-28 ENCOUNTER — Ambulatory Visit (INDEPENDENT_AMBULATORY_CARE_PROVIDER_SITE_OTHER): Payer: Commercial Managed Care - HMO | Admitting: Family Medicine

## 2014-01-28 VITALS — BP 138/80 | HR 80 | Temp 98.0°F | Ht 68.0 in | Wt 195.0 lb

## 2014-01-28 DIAGNOSIS — F329 Major depressive disorder, single episode, unspecified: Secondary | ICD-10-CM

## 2014-01-28 DIAGNOSIS — K219 Gastro-esophageal reflux disease without esophagitis: Secondary | ICD-10-CM

## 2014-01-28 DIAGNOSIS — Z0000 Encounter for general adult medical examination without abnormal findings: Secondary | ICD-10-CM

## 2014-01-28 DIAGNOSIS — Z23 Encounter for immunization: Secondary | ICD-10-CM

## 2014-01-28 DIAGNOSIS — F3289 Other specified depressive episodes: Secondary | ICD-10-CM

## 2014-01-28 DIAGNOSIS — E785 Hyperlipidemia, unspecified: Secondary | ICD-10-CM

## 2014-01-28 NOTE — Progress Notes (Signed)
Subjective:    Patient ID: Donald Pope, male    DOB: 19-Oct-1945, 68 y.o.   MRN: [id number]  HPI Patient seen for Medicare wellness exam and medical followup. He's had ongoing problems with back pain and bilateral leg aches following spinal stenosis surgery last January. He continues to see neurosurgeon.  Hyperlipidemia treated with Crestor 5 mg daily. No recent chest pains. Exercise is somewhat limited by his ongoing leg pain and back pain issues. Remains on citalopram for depression and that is stable. He takes omeprazole for GERD and GERD symptoms are stable. No dysphagia. Occasional slow urinary stream.  Past Medical History  Diagnosis Date  . HYPERLIPIDEMIA 09/13/2008  . THROMBOCYTOPENIA 09/13/2008  . DEPRESSION 09/13/2008  . OBSTRUCTIVE SLEEP APNEA 10/23/2009    cpap  . CAD, NATIVE VESSEL 10/23/2009  . GERD 09/13/2008  . False positive stress test     history of, suggestive of inferior ischemia  . Benign prostatic hypertrophy     possible  . Spinal stenosis of lumbar region 08/18/2012  . Allergy   . RBBB 10/26/2009    [place]   Past Surgical History  Procedure Laterality Date  . Tonsillectomy    . Nasal polyp excision    . Colonoscopy    . Cardiac catheterization      10 yrs ago  . Maximum access (mas)posterior lumbar interbody fusion (plif) 1 level N/A 05/27/2013    Procedure: FOR MAXIMUM ACCESS (MAS) POSTERIOR LUMBAR INTERBODY FUSION (PLIF) 1 LEVEL ,LUMBAR FOUR-FIVE;  Surgeon: Eustace Baham, MD;  Location: [place] [facility];  Service: Neurosurgery;  Laterality: N/A;  FOR MAXIMUM ACCESS (MAS) POSTERIOR LUMBAR INTERBODY FUSION (PLIF) 1 LEVEL ,LUMBAR FOUR-FIVE    reports that he has never smoked. He has never used smokeless tobacco. He reports that he drinks about 3 ounces of alcohol per week. He reports that he does not use illicit drugs. family history includes COPD in his father; Heart disease in his father; Heart disease (age of onset: 23) in his mother; Hyperlipidemia in his sister;  Hypertension in his sister. There is no history of Colon cancer or Stomach cancer. Allergies  Allergen Reactions  . Adhesive [Tape] Rash  . Codeine Sulfate Other (See Comments)    urination problems  . Latex Rash   1.  Risk factors based on Past Medical , Social, and Family history reviewed and as indicated above with no changes 2.  Limitations in physical activities None.  No recent falls. Limited in exercise recently secondary to back and leg pain. 3.  Depression/mood No active depression or anxiety issues 4.  Hearing No defiits 5.  ADLs independent in all. 6.  Cognitive function (orientation to time and place, language, writing, speech,memory) no short or long term memory issues.  Language and judgement intact. 7.  Home Safety no issues 8.  Height, weight, and visual acuity.all stable. 9.  Counseling discussed weight control and more consistent exercise 10. Recommendation of preventive services. Flu vaccine and Prevnar 13 11. Labs based on risk factors lipid, hepatic, basic metabolic panel, PSA 12. Care Plan as above 13. Other Providers Dr Aundra Dubin (cardiology) and Dr Ronnald Ramp (neurosurgery) 14. Written schedule of screening/prevention services given to patient.    Review of Systems  Constitutional: Negative for fever, activity change, appetite change and fatigue.  HENT: Negative for congestion, ear pain and trouble swallowing.   Eyes: Negative for pain and visual disturbance.  Respiratory: Negative for cough, shortness of breath and wheezing.   Cardiovascular: Negative for chest  pain and palpitations.  Gastrointestinal: Negative for nausea, vomiting, abdominal pain, diarrhea, constipation, blood in stool, abdominal distention and rectal pain.  Genitourinary: Negative for dysuria, hematuria and testicular pain.  Musculoskeletal: Negative for arthralgias and joint swelling.  Skin: Negative for rash.  Neurological: Negative for dizziness, syncope and headaches.  Hematological:  Negative for adenopathy.  Psychiatric/Behavioral: Negative for confusion and dysphoric mood.       Objective:   Physical Exam  Constitutional: He is oriented to person, place, and time. He appears well-developed and well-nourished. No distress.  HENT:  Head: Normocephalic and atraumatic.  Right Ear: External ear normal.  Left Ear: External ear normal.  Mouth/Throat: Oropharynx is clear and moist.  Eyes: Conjunctivae and EOM are normal. Pupils are equal, round, and reactive to light.  Neck: Normal range of motion. Neck supple. No thyromegaly present.  Cardiovascular: Normal rate, regular rhythm and normal heart sounds.   No murmur heard. Pulmonary/Chest: No respiratory distress. He has no wheezes. He has no rales.  Abdominal: Soft. Bowel sounds are normal. He exhibits no distension and no mass. There is no tenderness. There is no rebound and no guarding.  Musculoskeletal: He exhibits no edema.  Lymphadenopathy:    He has no cervical adenopathy.  Neurological: He is alert and oriented to person, place, and time. He displays normal reflexes. No cranial nerve deficit.  Skin: No rash noted.  Psychiatric: He has a normal mood and affect.          Assessment & Plan:  #1 health maintenance. Flu vaccine given. Prevnar 13 given. Other immunizations up to date. Colonoscopy up to date #2 hyperlipidemia. He has dyslipidemia with high triglycerides and low HDL. LDL stable. Continue Crestor #3 history of depression currently stable. Continue citalopram  #4 GERD symptomatically stable. Continue omeprazole.

## 2014-01-28 NOTE — Patient Instructions (Signed)
Continue yearly flu vaccine. Tetanus every 10 years. Let me know if you have any progressive urinary symptoms with slow stream

## 2014-01-28 NOTE — Progress Notes (Signed)
Pre visit review using our clinic review tool, if applicable. No additional management support is needed unless otherwise documented below in the visit note. 

## 2014-04-28 ENCOUNTER — Other Ambulatory Visit: Payer: Self-pay | Admitting: Cardiology

## 2014-06-13 ENCOUNTER — Encounter: Payer: Self-pay | Admitting: *Deleted

## 2014-06-13 ENCOUNTER — Encounter: Payer: Self-pay | Admitting: Cardiology

## 2014-06-13 ENCOUNTER — Ambulatory Visit (INDEPENDENT_AMBULATORY_CARE_PROVIDER_SITE_OTHER): Payer: PPO | Admitting: Cardiology

## 2014-06-13 VITALS — BP 112/70 | HR 82 | Ht 68.0 in | Wt 200.0 lb

## 2014-06-13 DIAGNOSIS — I739 Peripheral vascular disease, unspecified: Secondary | ICD-10-CM

## 2014-06-13 DIAGNOSIS — E785 Hyperlipidemia, unspecified: Secondary | ICD-10-CM

## 2014-06-13 DIAGNOSIS — I2510 Atherosclerotic heart disease of native coronary artery without angina pectoris: Secondary | ICD-10-CM

## 2014-06-13 DIAGNOSIS — M79606 Pain in leg, unspecified: Secondary | ICD-10-CM

## 2014-06-13 MED ORDER — ROSUVASTATIN CALCIUM 10 MG PO TABS
10.0000 mg | ORAL_TABLET | Freq: Every day | ORAL | Status: DC
Start: 2014-06-13 — End: 2017-11-27

## 2014-06-13 NOTE — Patient Instructions (Signed)
Increase crestor to 10mg  daily. You can take 2 of your 5mg  tablets at the same time and use your current supply.  Your physician has requested that you have a lower  extremity arterial duplex. This test is an ultrasound of the arteries in the legs . It looks at arterial blood flow in the legs . Allow one hour for Lower  Arterial scans. There are no restrictions or special instructions  Your physician recommends that you return for a FASTING lipid profile/liver profile in 2 months.  Your physician wants you to follow-up in: 1 year with Dr Aundra Dubin. (February 2017).  You will receive a reminder letter in the mail two months in advance. If you don't receive a letter, please call our office to schedule the follow-up appointment.

## 2014-06-14 DIAGNOSIS — I739 Peripheral vascular disease, unspecified: Secondary | ICD-10-CM | POA: Insufficient documentation

## 2014-06-14 NOTE — Progress Notes (Signed)
Patient ID: Donald Pope, male   DOB: 1945-08-24, 69 y.o.   MRN: [id number] PCP: Dr. Elease Hashimoto  69 yo with history of nonobstructive CAD presents for cardiology followup.  He has been doing well from a cardiovascular standpoint.  No chest pain, no exertional dyspnea.  He is active in general.  His main problem has been low back pain radiating into his legs bilaterally.  The pain is worse in the right leg, and worse at night when lying in bed.  He had L-spine fusion in 1/15 but still has the back and leg pain.  He does yoga and walks his dog for exercise.   Labs (8/14): LDL 124, HDL 32 Labs (1/15): K 4.3, creatinine 0.87 Labs (9/15): LDL 106, HDL 32, TGs 250  ECG: NSR, RBBB  PMH: 1. CAD: Cardiolite in 2007 showed inferior ischemia so patient had LHC in 2007.  This showed 40% PDA stenosis, 80% stenosis in a septal perforator, 30% mLAD, EF 65%.   2. Hyperlipidemia: Myalgias with statins. 3. OSA on CPAP 4. RBBB 5. GERD 6. Spinal stenosis: L-spine fusion in 1/15.   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   FH: Mother with CHF  ROS: All systems reviewed and negative except as per HPI.   Current Outpatient Prescriptions  Medication Sig Dispense Refill  . aspirin 81 MG EC tablet Take 81 mg by mouth daily.      . cetirizine (ZYRTEC) 10 MG tablet Take 10 mg by mouth daily.    . cholecalciferol (VITAMIN D) 1000 UNITS tablet Take 1,000 Units by mouth daily.     . citalopram (CELEXA) 10 MG tablet TAKE 1 TABLET BY MOUTH EVERY DAY Patient needs an appt by august or september 90 tablet 1  . fluticasone (FLONASE) 50 MCG/ACT nasal spray Place 2 sprays into the nose daily.      . Multiple Vitamin (MULTIVITAMIN) tablet Take 1 tablet by mouth daily.    Marland Kitchen omeprazole (PRILOSEC) 20 MG capsule Take 20 mg by mouth every other day.     . oxyCODONE-acetaminophen (PERCOCET/ROXICET) 5-325 MG per tablet Take 1-2 tablets by mouth every 4 (four)  hours as needed for moderate pain. 90 tablet 0  . rosuvastatin (CRESTOR) 10 MG tablet Take 1 tablet (10 mg total) by mouth daily. 30 tablet 3   No current facility-administered medications for this visit.   BP 112/70 mmHg  Pulse 82  Ht 5\' 8"  (1.727 m)  Wt 200 lb (90.719 kg)  BMI 30.42 kg/m2 General: NAD Neck: No JVD, no thyromegaly or thyroid nodule.  Lungs: Clear to auscultation bilaterally with normal respiratory effort. CV: Nondisplaced PMI.  Heart regular S1/S2, no S3/S4, no murmur.  No peripheral edema.  No carotid bruit.  Unable to palpate right PT pulse.   Abdomen: Soft, nontender, no hepatosplenomegaly, no distention.  Skin: Intact without lesions or rashes.  Neurologic: Alert and oriented x 3.  Psych: Normal affect. Extremities: No clubbing or cyanosis.   Assessment/Plan: 1. CAD: Nonobstructive on LHC in 2007.  No ischemic symptoms.  Continue ASA 81 and statin.  2. Hyperlipidemia: Goal LDL < 70 with known CAD.  She has tolerated Crestor 5 mg daily, will increase to 10 mg daily with lipids/LFTs in 2 months.  3. Leg pain: Not classic for claudication, but but unable to palpate right PT pulse and right leg pain is worse than the left leg pain.  I will check peripheral arterial  dopplers to rule out significant PAD.   Loralie Champagne 06/14/2014

## 2014-06-20 ENCOUNTER — Ambulatory Visit: Payer: Commercial Managed Care - HMO | Admitting: Cardiology

## 2014-06-20 ENCOUNTER — Ambulatory Visit (HOSPITAL_COMMUNITY): Payer: PPO | Attending: Cardiology | Admitting: Cardiology

## 2014-06-20 DIAGNOSIS — E785 Hyperlipidemia, unspecified: Secondary | ICD-10-CM | POA: Diagnosis not present

## 2014-06-20 DIAGNOSIS — R0989 Other specified symptoms and signs involving the circulatory and respiratory systems: Secondary | ICD-10-CM | POA: Insufficient documentation

## 2014-06-20 DIAGNOSIS — M79606 Pain in leg, unspecified: Secondary | ICD-10-CM | POA: Diagnosis not present

## 2014-06-20 DIAGNOSIS — I739 Peripheral vascular disease, unspecified: Secondary | ICD-10-CM | POA: Insufficient documentation

## 2014-06-20 NOTE — Progress Notes (Signed)
Lower arterial Doppler performed  

## 2014-08-15 ENCOUNTER — Other Ambulatory Visit (INDEPENDENT_AMBULATORY_CARE_PROVIDER_SITE_OTHER): Payer: PPO | Admitting: *Deleted

## 2014-08-15 DIAGNOSIS — E785 Hyperlipidemia, unspecified: Secondary | ICD-10-CM | POA: Diagnosis not present

## 2014-08-15 DIAGNOSIS — M79606 Pain in leg, unspecified: Secondary | ICD-10-CM | POA: Diagnosis not present

## 2014-08-15 LAB — LIPID PANEL
Cholesterol: 115 mg/dL (ref 0–200)
HDL: 34.5 mg/dL — ABNORMAL LOW (ref >39.00)
LDL Cholesterol: 56 mg/dL (ref 0–99)
NonHDL: 80.5
Total CHOL/HDL Ratio: 3
Triglycerides: 124 mg/dL (ref 0.0–149.0)
VLDL: 24.8 mg/dL (ref 0.0–40.0)

## 2014-08-15 LAB — HEPATIC FUNCTION PANEL
ALT: 22 U/L (ref 0–53)
AST: 24 U/L (ref 0–37)
Albumin: 4.3 g/dL (ref 3.5–5.2)
Alkaline Phosphatase: 56 U/L (ref 39–117)
Bilirubin, Direct: 0.1 mg/dL (ref 0.0–0.3)
Total Bilirubin: 0.4 mg/dL (ref 0.2–1.2)
Total Protein: 7.1 g/dL (ref 6.0–8.3)

## 2014-08-15 NOTE — Addendum Note (Signed)
Addended by: Eulis Foster on: 08/15/2014 09:32 AM   Modules accepted: Orders

## 2014-09-24 ENCOUNTER — Other Ambulatory Visit: Payer: Self-pay | Admitting: Family Medicine

## 2014-09-28 ENCOUNTER — Other Ambulatory Visit: Payer: Self-pay | Admitting: Family Medicine

## 2015-01-31 IMAGING — RF DG LUMBAR SPINE 2-3V
1 series · 4 of 4 positions shown · non-contrast
Comparison: Plain films lumbar spine 03/29/2013.

CLINICAL DATA: L4-5 laminectomy and fusion.

EXAM:
LUMBAR SPINE - 2-3 VIEW; DG C-ARM 1-60 MIN

[Series 1: run · 4 of 4 slices shown]
[im 1/4]
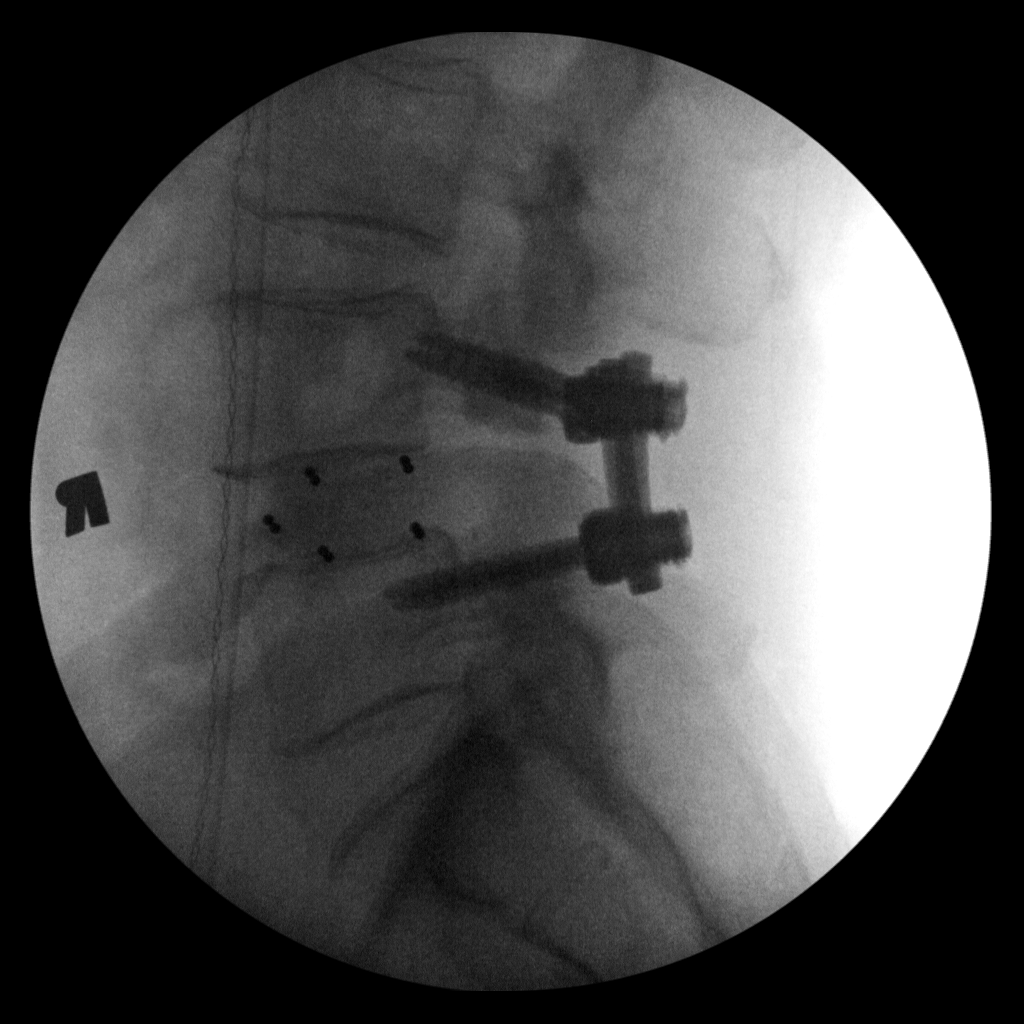
[im 2/4]
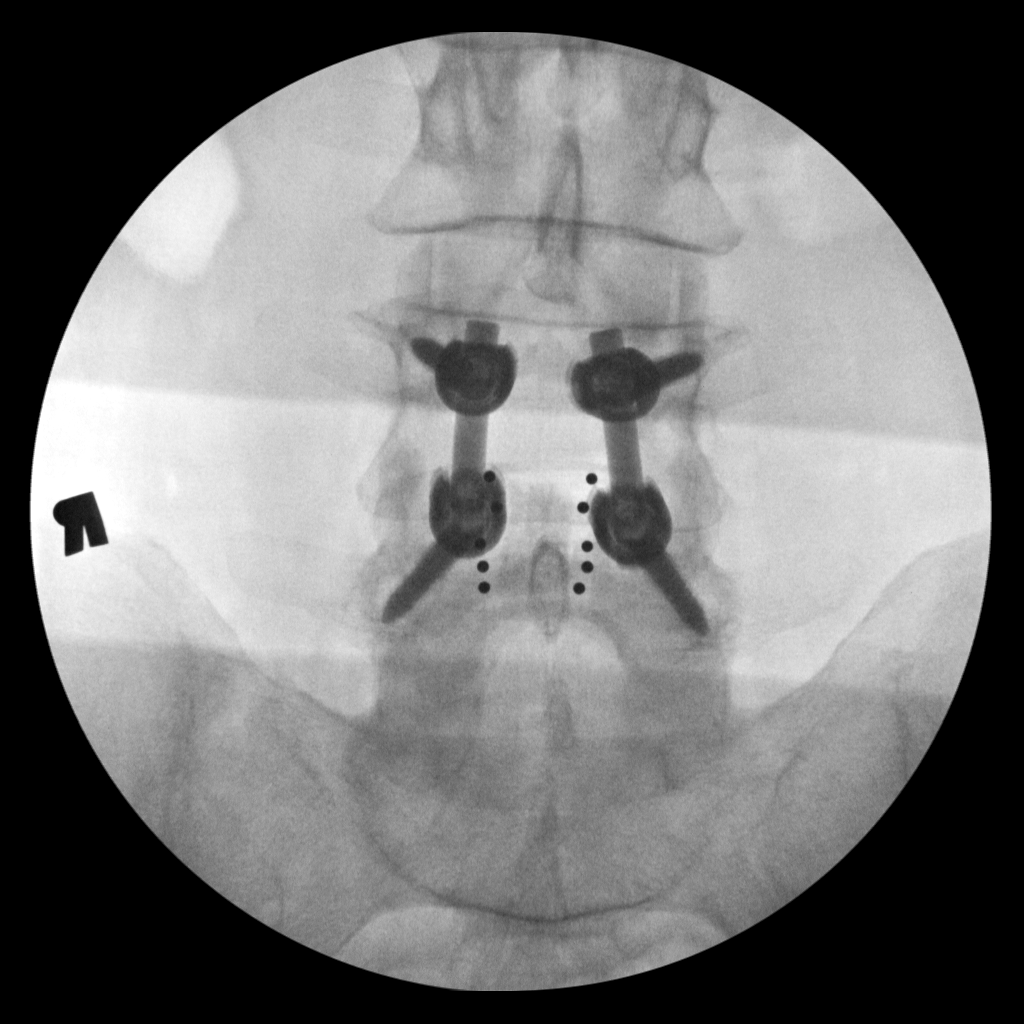
[im 3/4]
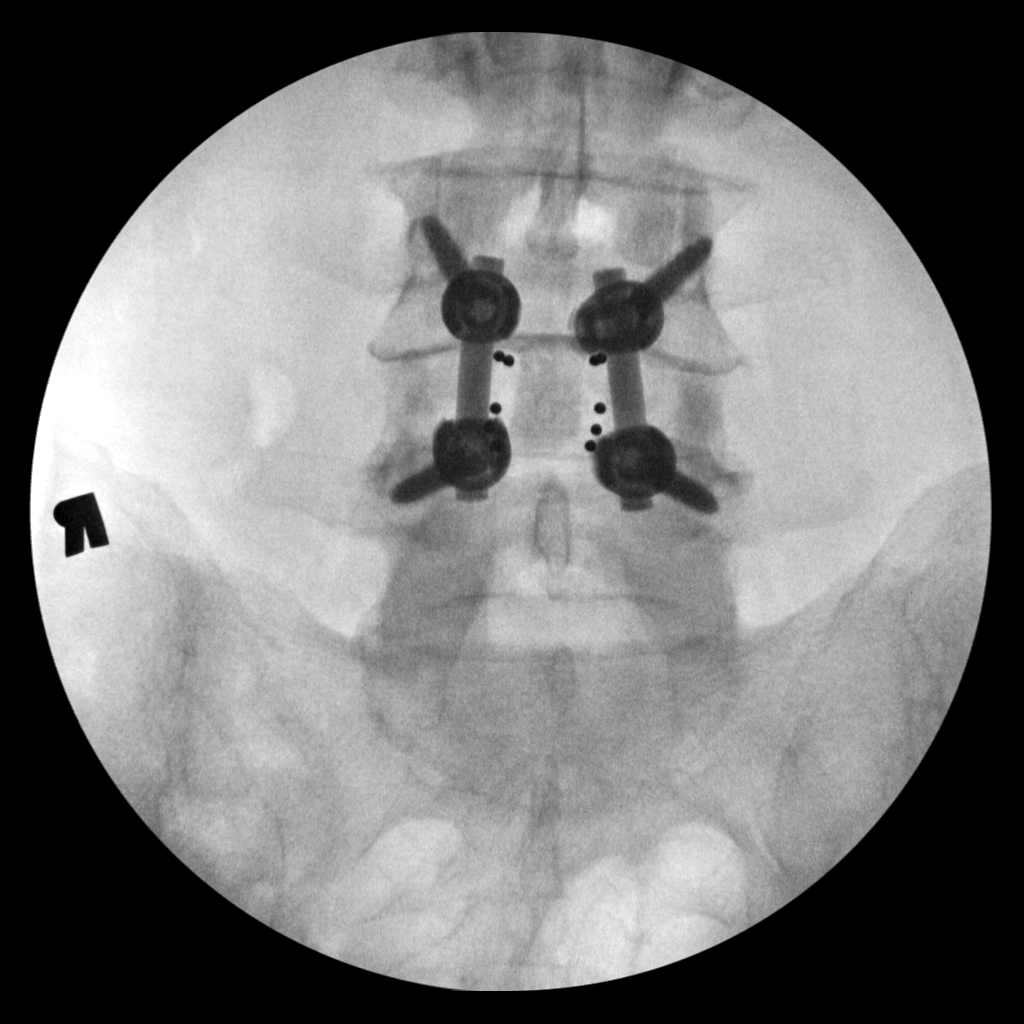
[im 4/4]
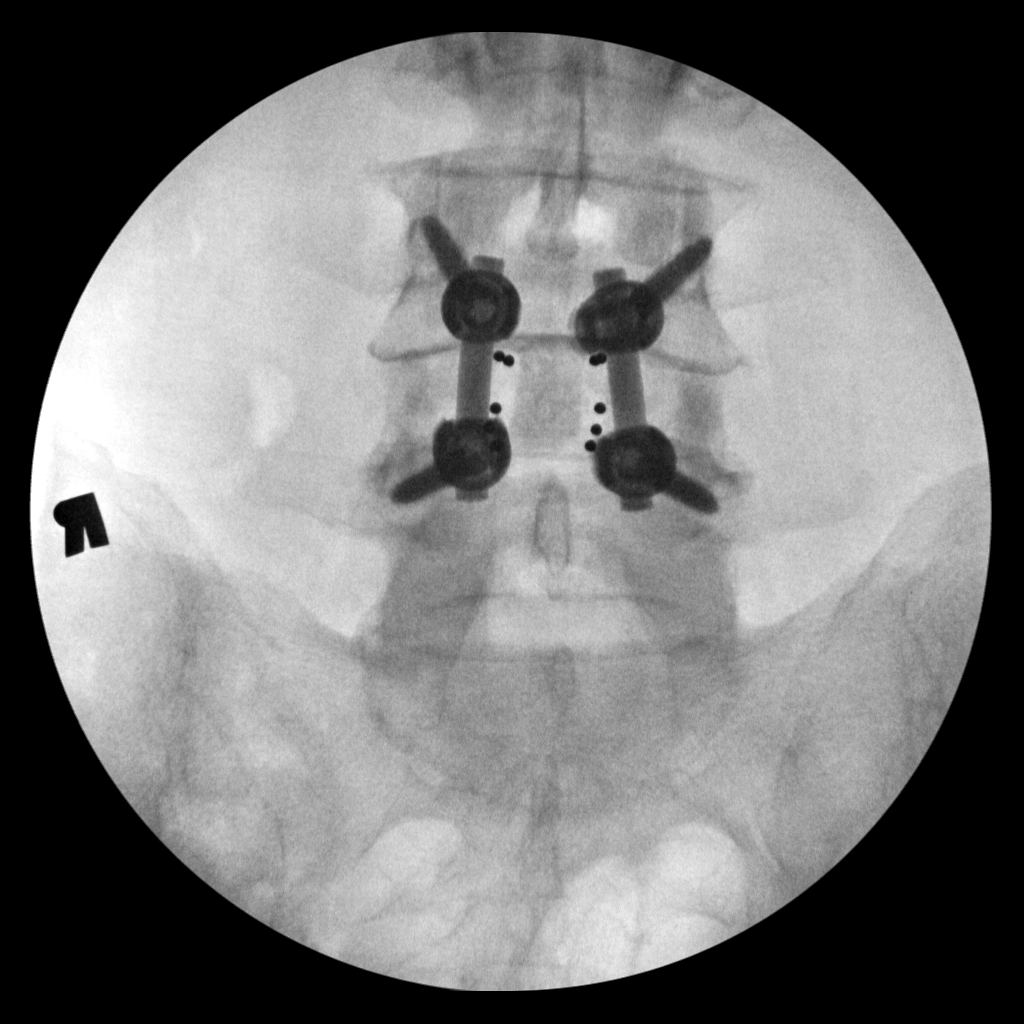

[4 of 4 positions shown; findings below may reference images not displayed]

FINDINGS: Three fluoroscopic intraoperative spot views of the lumbar spine are
provided in demonstrate postoperative change of L4-5 laminectomy and
fusion with pedicle screws, stabilization bars and interbody spacer
in place.
IMPRESSION: L4-5 fusion.

## 2015-03-02 ENCOUNTER — Other Ambulatory Visit (INDEPENDENT_AMBULATORY_CARE_PROVIDER_SITE_OTHER): Payer: PPO

## 2015-03-02 DIAGNOSIS — R7989 Other specified abnormal findings of blood chemistry: Secondary | ICD-10-CM | POA: Diagnosis not present

## 2015-03-02 DIAGNOSIS — Z0000 Encounter for general adult medical examination without abnormal findings: Secondary | ICD-10-CM

## 2015-03-02 LAB — CBC WITH DIFFERENTIAL/PLATELET
Basophils Absolute: 0 10*3/uL (ref 0.0–0.1)
Basophils Relative: 0.7 % (ref 0.0–3.0)
Eosinophils Absolute: 0.1 10*3/uL (ref 0.0–0.7)
Eosinophils Relative: 1.1 % (ref 0.0–5.0)
HCT: 42.5 % (ref 39.0–52.0)
Hemoglobin: 14.3 g/dL (ref 13.0–17.0)
Lymphocytes Relative: 29.2 % (ref 12.0–46.0)
Lymphs Abs: 1.9 10*3/uL (ref 0.7–4.0)
MCHC: 33.8 g/dL (ref 30.0–36.0)
MCV: 91.5 fl (ref 78.0–100.0)
Monocytes Absolute: 0.6 10*3/uL (ref 0.1–1.0)
Monocytes Relative: 8.6 % (ref 3.0–12.0)
Neutro Abs: 3.9 10*3/uL (ref 1.4–7.7)
Neutrophils Relative %: 60.4 % (ref 43.0–77.0)
Platelets: 125 10*3/uL — ABNORMAL LOW (ref 150.0–400.0)
RBC: 4.64 Mil/uL (ref 4.22–5.81)
RDW: 13.2 % (ref 11.5–15.5)
WBC: 6.5 10*3/uL (ref 4.0–10.5)

## 2015-03-02 LAB — BASIC METABOLIC PANEL
BUN: 22 mg/dL (ref 6–23)
CO2: 27 mEq/L (ref 19–32)
Calcium: 9.5 mg/dL (ref 8.4–10.5)
Chloride: 103 mEq/L (ref 96–112)
Creatinine, Ser: 0.98 mg/dL (ref 0.40–1.50)
GFR: 80.34 mL/min (ref >60.00)
Glucose, Bld: 114 mg/dL — ABNORMAL HIGH (ref 70–99)
Potassium: 4.1 mEq/L (ref 3.5–5.1)
Sodium: 141 mEq/L (ref 135–145)

## 2015-03-02 LAB — HEPATIC FUNCTION PANEL
ALT: 20 U/L (ref 0–53)
AST: 23 U/L (ref 0–37)
Albumin: 4.4 g/dL (ref 3.5–5.2)
Alkaline Phosphatase: 58 U/L (ref 39–117)
Bilirubin, Direct: 0.1 mg/dL (ref 0.0–0.3)
Total Bilirubin: 0.6 mg/dL (ref 0.2–1.2)
Total Protein: 7.4 g/dL (ref 6.0–8.3)

## 2015-03-02 LAB — LIPID PANEL
Cholesterol: 208 mg/dL — ABNORMAL HIGH (ref 0–200)
HDL: 35.9 mg/dL — ABNORMAL LOW (ref >39.00)
NonHDL: 172.02
Total CHOL/HDL Ratio: 6
Triglycerides: 333 mg/dL — ABNORMAL HIGH (ref 0.0–149.0)
VLDL: 66.6 mg/dL — ABNORMAL HIGH (ref 0.0–40.0)

## 2015-03-02 LAB — LDL CHOLESTEROL, DIRECT: Direct LDL: 129 mg/dL

## 2015-03-02 LAB — TSH: TSH: 3.31 u[IU]/mL (ref 0.35–4.50)

## 2015-03-02 LAB — PSA: PSA: 0.44 ng/mL (ref 0.10–4.00)

## 2015-03-08 ENCOUNTER — Encounter: Payer: Self-pay | Admitting: Family Medicine

## 2015-03-08 ENCOUNTER — Telehealth: Payer: Self-pay | Admitting: Family Medicine

## 2015-03-08 ENCOUNTER — Ambulatory Visit (INDEPENDENT_AMBULATORY_CARE_PROVIDER_SITE_OTHER): Payer: PPO | Admitting: Family Medicine

## 2015-03-08 VITALS — BP 150/90 | HR 97 | Temp 97.4°F | Ht 68.0 in | Wt 200.0 lb

## 2015-03-08 DIAGNOSIS — Z23 Encounter for immunization: Secondary | ICD-10-CM | POA: Diagnosis not present

## 2015-03-08 DIAGNOSIS — Z0000 Encounter for general adult medical examination without abnormal findings: Secondary | ICD-10-CM | POA: Diagnosis not present

## 2015-03-08 NOTE — Progress Notes (Signed)
Subjective:    Patient ID: Donald Pope, male    DOB: [birth year], 69 y.o.   MRN: [id number]  HPI Patient seen for complete physical. He has chronic problems including hyperlipidemia, history of recurrent depression, seasonal allergies, GERD, spinal stenosis. He had back surgery for spinal stenosis January 2015. He's had some ongoing back pain since then. He recently went to the [facility] and was prescribed Cymbalta which does not help much thus far. He had been prescribed oxycodone per neurosurgical but did help it has been recently taking up to 1 at night which seems to help get him through the night. He did not get relief with over-the-counter medications.  Needs flu vaccine. Other immunizations up-to-date. Colonoscopy up-to-date.  Past Medical History  Diagnosis Date  . HYPERLIPIDEMIA 09/13/2008  . THROMBOCYTOPENIA 09/13/2008  . DEPRESSION 09/13/2008  . OBSTRUCTIVE SLEEP APNEA 10/23/2009    cpap  . CAD, NATIVE VESSEL 10/23/2009  . GERD 09/13/2008  . False positive stress test     history of, suggestive of inferior ischemia  . Benign prostatic hypertrophy     possible  . Spinal stenosis of lumbar region 08/18/2012  . Allergy   . RBBB 10/26/2009    [place]   Past Surgical History  Procedure Laterality Date  . Tonsillectomy    . Nasal polyp excision    . Colonoscopy    . Cardiac catheterization      10 yrs ago  . Maximum access (mas)posterior lumbar interbody fusion (plif) 1 level N/A 05/27/2013    Procedure: FOR MAXIMUM ACCESS (MAS) POSTERIOR LUMBAR INTERBODY FUSION (PLIF) 1 LEVEL ,LUMBAR FOUR-FIVE;  Surgeon: Eustace Chio, MD;  Location: [place] [facility];  Service: Neurosurgery;  Laterality: N/A;  FOR MAXIMUM ACCESS (MAS) POSTERIOR LUMBAR INTERBODY FUSION (PLIF) 1 LEVEL ,LUMBAR FOUR-FIVE    reports that he has never smoked. He has never used smokeless tobacco. He reports that he drinks about 3.0 oz of alcohol per week. He reports that he does not use illicit drugs. family history includes  COPD in his father; Heart disease in his father; Heart disease (age of onset: 14) in his mother; Hyperlipidemia in his sister; Hypertension in his sister. There is no history of Colon cancer or Stomach cancer. Allergies  Allergen Reactions  . Adhesive [Tape] Rash  . Codeine Sulfate Other (See Comments)    urination problems  . Latex Rash      Review of Systems  Constitutional: Negative for fever, activity change, appetite change and fatigue.  HENT: Negative for congestion, ear pain and trouble swallowing.   Eyes: Negative for pain and visual disturbance.  Respiratory: Negative for cough, shortness of breath and wheezing.   Cardiovascular: Negative for chest pain and palpitations.  Gastrointestinal: Negative for nausea, vomiting, abdominal pain, diarrhea, constipation, blood in stool, abdominal distention and rectal pain.  Genitourinary: Negative for dysuria, hematuria and testicular pain.  Musculoskeletal: Positive for back pain. Negative for joint swelling and arthralgias.  Skin: Negative for rash.  Neurological: Negative for dizziness, syncope and headaches.  Hematological: Negative for adenopathy.  Psychiatric/Behavioral: Negative for confusion and dysphoric mood.       Objective:   Physical Exam  Constitutional: He is oriented to person, place, and time. He appears well-developed and well-nourished. No distress.  HENT:  Head: Normocephalic and atraumatic.  Right Ear: External ear normal.  Left Ear: External ear normal.  Mouth/Throat: Oropharynx is clear and moist.  Eyes: Conjunctivae and EOM are normal. Pupils are equal, round, and reactive to  light.  Neck: Normal range of motion. Neck supple. No thyromegaly present.  Cardiovascular: Normal rate, regular rhythm and normal heart sounds.   No murmur heard. Pulmonary/Chest: No respiratory distress. He has no wheezes. He has no rales.  Abdominal: Soft. Bowel sounds are normal. He exhibits no distension and no mass. There is no  tenderness. There is no rebound and no guarding.  Musculoskeletal: He exhibits no edema.  Lymphadenopathy:    He has no cervical adenopathy.  Neurological: He is alert and oriented to person, place, and time. He displays normal reflexes. No cranial nerve deficit.  Skin: No rash noted.  Psychiatric: He has a normal mood and affect.          Assessment & Plan:  Complete physical. Labs reviewed. His lipids are significant elevated but he has not been taking his Crestor regularly. He is encouraged to get back on Crestor daily. Lose some weight. Establish more consistent exercise. Confirm dose of Cymbalta. Blood pressure elevated. Information on DASH diet given. Reassess blood pressure here in 3 weeks. Consider home monitor.

## 2015-03-08 NOTE — Telephone Encounter (Signed)
FYI ;Pt said the name of the medicine is DULOXETINE HCL 20MG   Take 2 capsules twice daily

## 2015-03-08 NOTE — Patient Instructions (Signed)
DASH Eating Plan DASH stands for "Dietary Approaches to Stop Hypertension." The DASH eating plan is a healthy eating plan that has been shown to reduce high blood pressure (hypertension). Additional health benefits may include reducing the risk of type 2 diabetes mellitus, heart disease, and stroke. The DASH eating plan may also help with weight loss. WHAT DO I NEED TO KNOW ABOUT THE DASH EATING PLAN? For the DASH eating plan, you will follow these general guidelines:  Choose foods with a percent daily value for sodium of less than 5% (as listed on the food label).  Use salt-free seasonings or herbs instead of table salt or sea salt.  Check with your health care provider or pharmacist before using salt substitutes.  Eat lower-sodium products, often labeled as "lower sodium" or "no salt added."  Eat fresh foods.  Eat more vegetables, fruits, and low-fat dairy products.  Choose whole grains. Look for the word "whole" as the first word in the ingredient list.  Choose fish and skinless chicken or turkey more often than red meat. Limit fish, poultry, and meat to 6 oz (170 g) each day.  Limit sweets, desserts, sugars, and sugary drinks.  Choose heart-healthy fats.  Limit cheese to 1 oz (28 g) per day.  Eat more home-cooked food and less restaurant, buffet, and fast food.  Limit fried foods.  Cook foods using methods other than frying.  Limit canned vegetables. If you do use them, rinse them well to decrease the sodium.  When eating at a restaurant, ask that your food be prepared with less salt, or no salt if possible. WHAT FOODS CAN I EAT? Seek help from a dietitian for individual calorie needs. Grains Whole grain or whole wheat bread. Brown rice. Whole grain or whole wheat pasta. Quinoa, bulgur, and whole grain cereals. Low-sodium cereals. Corn or whole wheat flour tortillas. Whole grain cornbread. Whole grain crackers. Low-sodium crackers. Vegetables Fresh or frozen vegetables  (raw, steamed, roasted, or grilled). Low-sodium or reduced-sodium tomato and vegetable juices. Low-sodium or reduced-sodium tomato sauce and paste. Low-sodium or reduced-sodium canned vegetables.  Fruits All fresh, canned (in natural juice), or frozen fruits. Meat and Other Protein Products Ground beef (85% or leaner), grass-fed beef, or beef trimmed of fat. Skinless chicken or turkey. Ground chicken or turkey. Pork trimmed of fat. All fish and seafood. Eggs. Dried beans, peas, or lentils. Unsalted nuts and seeds. Unsalted canned beans. Dairy Low-fat dairy products, such as skim or 1% milk, 2% or reduced-fat cheeses, low-fat ricotta or cottage cheese, or plain low-fat yogurt. Low-sodium or reduced-sodium cheeses. Fats and Oils Tub margarines without trans fats. Light or reduced-fat mayonnaise and salad dressings (reduced sodium). Avocado. Safflower, olive, or canola oils. Natural peanut or almond butter. Other Unsalted popcorn and pretzels. The items listed above may not be a complete list of recommended foods or beverages. Contact your dietitian for more options. WHAT FOODS ARE NOT RECOMMENDED? Grains White bread. White pasta. White rice. Refined cornbread. Bagels and croissants. Crackers that contain trans fat. Vegetables Creamed or fried vegetables. Vegetables in a cheese sauce. Regular canned vegetables. Regular canned tomato sauce and paste. Regular tomato and vegetable juices. Fruits Dried fruits. Canned fruit in light or heavy syrup. Fruit juice. Meat and Other Protein Products Fatty cuts of meat. Ribs, chicken wings, bacon, sausage, bologna, salami, chitterlings, fatback, hot dogs, bratwurst, and packaged luncheon meats. Salted nuts and seeds. Canned beans with salt. Dairy Whole or 2% milk, cream, half-and-half, and cream cheese. Whole-fat or sweetened yogurt. Full-fat   cheeses or blue cheese. Nondairy creamers and whipped toppings. Processed cheese, cheese spreads, or cheese  curds. Condiments Onion and garlic salt, seasoned salt, table salt, and sea salt. Canned and packaged gravies. Worcestershire sauce. Tartar sauce. Barbecue sauce. Teriyaki sauce. Soy sauce, including reduced sodium. Steak sauce. Fish sauce. Oyster sauce. Cocktail sauce. Horseradish. Ketchup and mustard. Meat flavorings and tenderizers. Bouillon cubes. Hot sauce. Tabasco sauce. Marinades. Taco seasonings. Relishes. Fats and Oils Butter, stick margarine, lard, shortening, ghee, and bacon fat. Coconut, palm kernel, or palm oils. Regular salad dressings. Other Pickles and olives. Salted popcorn and pretzels. The items listed above may not be a complete list of foods and beverages to avoid. Contact your dietitian for more information. WHERE CAN I FIND MORE INFORMATION? National Heart, Lung, and Blood Institute: travelstabloid.com   This information is not intended to replace advice given to you by your health care provider. Make sure you discuss any questions you have with your health care provider.   Document Released: 04/18/2011 Document Revised: 05/20/2014 Document Reviewed: 03/03/2013 Elsevier Interactive Patient Education 2016 [employer].  Try to lose some weight Establish consistent exercise Get back on regular use of Crestor Follow-up in 3 weeks to reassess blood pressure

## 2015-03-08 NOTE — Progress Notes (Signed)
Pre visit review using our clinic review tool, if applicable. No additional management support is needed unless otherwise documented below in the visit note. 

## 2015-03-29 ENCOUNTER — Encounter: Payer: Self-pay | Admitting: Family Medicine

## 2015-03-29 ENCOUNTER — Ambulatory Visit (INDEPENDENT_AMBULATORY_CARE_PROVIDER_SITE_OTHER): Payer: PPO | Admitting: Family Medicine

## 2015-03-29 VITALS — BP 110/80 | HR 102 | Temp 98.3°F | Resp 14 | Ht 68.0 in | Wt 196.3 lb

## 2015-03-29 DIAGNOSIS — G8929 Other chronic pain: Secondary | ICD-10-CM

## 2015-03-29 DIAGNOSIS — M549 Dorsalgia, unspecified: Secondary | ICD-10-CM | POA: Diagnosis not present

## 2015-03-29 DIAGNOSIS — R030 Elevated blood-pressure reading, without diagnosis of hypertension: Secondary | ICD-10-CM | POA: Diagnosis not present

## 2015-03-29 DIAGNOSIS — IMO0001 Reserved for inherently not codable concepts without codable children: Secondary | ICD-10-CM

## 2015-03-29 NOTE — Progress Notes (Signed)
Subjective:    Patient ID: Donald Pope, male    DOB: 02-20-1946, 69 y.o.   MRN: [id number]  HPI   Recent elevated blood pressure. Patient currently not treated. Blood pressure 150/90 last visit. He has lost a few pounds due to his efforts since last visit. No headaches. No dizziness. No chest pains. He's been trying to follow DASH diet  History of spinal stenosis. He's been followed by neurosurgery. He's had previous surgery but has some chronic radiculopathy pains. This seems to be worse when he is supine at night. He was treated for quite some time by neurosurgeon on low-dose oxycodone at night which seemed to help. He is requesting referral back to chronic pain management with [place] [facility]. He was tried on Cymbalta per [facility] which has not helped his chronic back pain. Previous intolerance with gabapentin  Past Medical History  Diagnosis Date  . HYPERLIPIDEMIA 09/13/2008  . THROMBOCYTOPENIA 09/13/2008  . DEPRESSION 09/13/2008  . OBSTRUCTIVE SLEEP APNEA 10/23/2009    cpap  . CAD, NATIVE VESSEL 10/23/2009  . GERD 09/13/2008  . False positive stress test     history of, suggestive of inferior ischemia  . Benign prostatic hypertrophy     possible  . Spinal stenosis of lumbar region 08/18/2012  . Allergy   . RBBB 10/26/2009    [place]   Past Surgical History  Procedure Laterality Date  . Tonsillectomy    . Nasal polyp excision    . Colonoscopy    . Cardiac catheterization      10 yrs ago  . Maximum access (mas)posterior lumbar interbody fusion (plif) 1 level N/A 05/27/2013    Procedure: FOR MAXIMUM ACCESS (MAS) POSTERIOR LUMBAR INTERBODY FUSION (PLIF) 1 LEVEL ,LUMBAR FOUR-FIVE;  Surgeon: Eustace Kauffman, MD;  Location: [facility];  Service: Neurosurgery;  Laterality: N/A;  FOR MAXIMUM ACCESS (MAS) POSTERIOR LUMBAR INTERBODY FUSION (PLIF) 1 LEVEL ,LUMBAR FOUR-FIVE    reports that he has never smoked. He has never used smokeless tobacco. He reports that he drinks about 3.0  oz of alcohol per week. He reports that he does not use illicit drugs. family history includes COPD in his father; Heart disease in his father; Heart disease (age of onset: 69) in his mother; Hyperlipidemia in his sister; Hypertension in his sister. There is no history of Colon cancer or Stomach cancer. Allergies  Allergen Reactions  . Adhesive [Tape] Rash  . Codeine Sulfate Other (See Comments)    urination problems  . Latex Rash      Review of Systems  Constitutional: Negative for fatigue and unexpected weight change.  Eyes: Negative for visual disturbance.  Respiratory: Negative for cough, chest tightness and shortness of breath.   Cardiovascular: Negative for chest pain, palpitations and leg swelling.  Gastrointestinal: Negative for abdominal pain.  Endocrine: Negative for polydipsia and polyuria.  Genitourinary: Negative for dysuria.  Musculoskeletal: Positive for back pain.  Neurological: Negative for dizziness, syncope, weakness, light-headedness and headaches.       Objective:   Physical Exam  Constitutional: He appears well-developed and well-nourished.  Neck: Neck supple. No thyromegaly present.  Cardiovascular: Normal rate and regular rhythm.   Pulmonary/Chest: Effort normal and breath sounds normal. No respiratory distress. He has no wheezes. He has no rales.  Musculoskeletal: He exhibits no edema.          Assessment & Plan:  #1 elevated blood pressure. Repeat blood pressure both right and left arm seated normal size cuff 152/90.  Discussed options with patient and wife including medication versus 3-4 months of further lifestyle management and weight loss and they prefer the latter. If not further improved at that time will initiate medication #2 chronic back and radiculitis pain. Referral to pain management with [place] [facility]

## 2015-05-24 DIAGNOSIS — G629 Polyneuropathy, unspecified: Secondary | ICD-10-CM | POA: Diagnosis not present

## 2015-05-24 DIAGNOSIS — M79604 Pain in right leg: Secondary | ICD-10-CM | POA: Diagnosis not present

## 2015-05-24 DIAGNOSIS — M79605 Pain in left leg: Secondary | ICD-10-CM | POA: Diagnosis not present

## 2015-06-27 DIAGNOSIS — G629 Polyneuropathy, unspecified: Secondary | ICD-10-CM | POA: Diagnosis not present

## 2015-06-27 DIAGNOSIS — M79604 Pain in right leg: Secondary | ICD-10-CM | POA: Diagnosis not present

## 2015-12-05 ENCOUNTER — Encounter: Payer: Self-pay | Admitting: Family Medicine

## 2015-12-05 ENCOUNTER — Ambulatory Visit (INDEPENDENT_AMBULATORY_CARE_PROVIDER_SITE_OTHER): Payer: PPO | Admitting: Family Medicine

## 2015-12-05 VITALS — BP 120/78 | HR 105 | Temp 98.0°F | Ht 68.0 in | Wt 196.1 lb

## 2015-12-05 DIAGNOSIS — R198 Other specified symptoms and signs involving the digestive system and abdomen: Secondary | ICD-10-CM | POA: Diagnosis not present

## 2015-12-05 DIAGNOSIS — R6889 Other general symptoms and signs: Secondary | ICD-10-CM

## 2015-12-05 DIAGNOSIS — R0989 Other specified symptoms and signs involving the circulatory and respiratory systems: Secondary | ICD-10-CM

## 2015-12-05 DIAGNOSIS — R0981 Nasal congestion: Secondary | ICD-10-CM | POA: Diagnosis not present

## 2015-12-05 NOTE — Progress Notes (Signed)
Subjective:     Patient ID: Donald Pope, male   DOB: 16-Jun-1945, 70 y.o.   MRN: [id number]  HPI Patient seen for acute visit with several week history of frequent throat clearing He's noticed some sinus congestion. No major sore throat symptoms. No headache. No purulent or bloody nasal discharge. Does have history of reflux and currently only takes Prilosec as needed. No cough. No hoarseness. No appetite or weight changes.  Past Medical History:  Diagnosis Date  . Allergy   . Benign prostatic hypertrophy    possible  . CAD, NATIVE VESSEL 10/23/2009  . DEPRESSION 09/13/2008  . False positive stress test    history of, suggestive of inferior ischemia  . GERD 09/13/2008  . HYPERLIPIDEMIA 09/13/2008  . OBSTRUCTIVE SLEEP APNEA 10/23/2009   cpap  . RBBB 10/26/2009   [facility]  . Spinal stenosis of lumbar region 08/18/2012  . THROMBOCYTOPENIA 09/13/2008   Past Surgical History:  Procedure Laterality Date  . CARDIAC CATHETERIZATION     10 yrs ago  . COLONOSCOPY    . MAXIMUM ACCESS (MAS)POSTERIOR LUMBAR INTERBODY FUSION (PLIF) 1 LEVEL N/A 05/27/2013   Procedure: FOR MAXIMUM ACCESS (MAS) POSTERIOR LUMBAR INTERBODY FUSION (PLIF) 1 LEVEL ,LUMBAR FOUR-FIVE;  Surgeon: Eustace Roseman, MD;  Location: [facility];  Service: Neurosurgery;  Laterality: N/A;  FOR MAXIMUM ACCESS (MAS) POSTERIOR LUMBAR INTERBODY FUSION (PLIF) 1 LEVEL ,LUMBAR FOUR-FIVE  . NASAL POLYP EXCISION    . TONSILLECTOMY      reports that he has never smoked. He has never used smokeless tobacco. He reports that he drinks about 3.0 oz of alcohol per week . He reports that he does not use drugs. family history includes COPD in his father; Heart disease in his father; Heart disease (age of onset: 76) in his mother; Hyperlipidemia in his sister; Hypertension in his sister. Allergies  Allergen Reactions  . Adhesive [Tape] Rash  . Codeine Sulfate Other (See Comments)    urination problems  . Latex Rash     Review of Systems   Constitutional: Negative for chills and fever.  HENT: Positive for congestion and postnasal drip. Negative for sore throat and trouble swallowing.   Respiratory: Negative for cough.   Neurological: Negative for headaches.       Objective:   Physical Exam  Constitutional: He appears well-developed and well-nourished.  HENT:  Right Ear: External ear normal.  Left Ear: External ear normal.  Mouth/Throat: Oropharynx is clear and moist.  Neck: Neck supple.  Cardiovascular: Normal rate and regular rhythm.   Pulmonary/Chest: Effort normal and breath sounds normal. No respiratory distress. He has no wheezes. He has no rales.  Lymphadenopathy:    He has no cervical adenopathy.       Assessment:     Patient presents with frequent nasal congestion and frequent throat clearing. Suspect reflux related. Already takes allergy regimen of Flonase and Zyrtec    Plan:     -Consider elevate head of bed about 6 inches -Avoid eating within 2-3 hours of bedtime -Increase Prilosec to 40 mg once daily -Touch base in 3-4 weeks if not improving  [name] MD [facility] at [facility]

## 2015-12-05 NOTE — Patient Instructions (Signed)
Consider elevate head of bed about 6 inches. Avoid eating within 2-3 hours of bedtime Consider increase Prilosec to 40 mg once daily.

## 2015-12-05 NOTE — Progress Notes (Signed)
Pre visit review using our clinic review tool, if applicable. No additional management support is needed unless otherwise documented below in the visit note. 

## 2015-12-14 ENCOUNTER — Encounter: Payer: Self-pay | Admitting: Internal Medicine

## 2015-12-15 DIAGNOSIS — H5203 Hypermetropia, bilateral: Secondary | ICD-10-CM | POA: Diagnosis not present

## 2015-12-15 DIAGNOSIS — H524 Presbyopia: Secondary | ICD-10-CM | POA: Diagnosis not present

## 2015-12-15 DIAGNOSIS — H1789 Other corneal scars and opacities: Secondary | ICD-10-CM | POA: Diagnosis not present

## 2015-12-15 DIAGNOSIS — H43393 Other vitreous opacities, bilateral: Secondary | ICD-10-CM | POA: Diagnosis not present

## 2015-12-15 DIAGNOSIS — H25813 Combined forms of age-related cataract, bilateral: Secondary | ICD-10-CM | POA: Diagnosis not present

## 2015-12-15 DIAGNOSIS — H52223 Regular astigmatism, bilateral: Secondary | ICD-10-CM | POA: Diagnosis not present

## 2016-01-03 ENCOUNTER — Telehealth: Payer: Self-pay | Admitting: Family Medicine

## 2016-01-03 DIAGNOSIS — G4733 Obstructive sleep apnea (adult) (pediatric): Secondary | ICD-10-CM

## 2016-01-03 NOTE — Telephone Encounter (Signed)
Pt called to check the status of his form from [facility] to receive a new CPAP machine.  The old machine is broken and he said he really need it at night.

## 2016-01-03 NOTE — Telephone Encounter (Signed)
I do not have a form on this. Okay to place an order?

## 2016-01-03 NOTE — Telephone Encounter (Signed)
yes

## 2016-01-04 NOTE — Telephone Encounter (Signed)
Order entered for new CPAP and message sent to [facility] at [facility].

## 2016-01-08 ENCOUNTER — Encounter: Payer: Self-pay | Admitting: Family Medicine

## 2016-01-08 ENCOUNTER — Ambulatory Visit (INDEPENDENT_AMBULATORY_CARE_PROVIDER_SITE_OTHER): Payer: PPO | Admitting: Family Medicine

## 2016-01-08 VITALS — BP 120/90 | HR 95 | Temp 98.3°F | Ht 68.0 in | Wt 199.8 lb

## 2016-01-08 DIAGNOSIS — G4733 Obstructive sleep apnea (adult) (pediatric): Secondary | ICD-10-CM

## 2016-01-08 DIAGNOSIS — M4806 Spinal stenosis, lumbar region: Secondary | ICD-10-CM

## 2016-01-08 DIAGNOSIS — M48061 Spinal stenosis, lumbar region without neurogenic claudication: Secondary | ICD-10-CM

## 2016-01-08 NOTE — Telephone Encounter (Signed)
After speaking with [facility]. I have scheduled pt today to be evaluated for a new CPAP machine. Then will fax information.

## 2016-01-08 NOTE — Patient Instructions (Signed)
Sleep Apnea  Sleep apnea is a sleep disorder characterized by abnormal pauses in breathing while you sleep. When your breathing pauses, the level of oxygen in your blood decreases. This causes you to move out of deep sleep and into light sleep. As a result, your quality of sleep is poor, and the system that carries your blood throughout your body (cardiovascular system) experiences stress. If sleep apnea remains untreated, the following conditions can develop:  High blood pressure (hypertension).  Coronary artery disease.  Inability to achieve or maintain an erection (impotence).  Impairment of your thought process (cognitive dysfunction). There are three types of sleep apnea: 1. Obstructive sleep apnea--Pauses in breathing during sleep because of a blocked airway. 2. Central sleep apnea--Pauses in breathing during sleep because the area of the brain that controls your breathing does not send the correct signals to the muscles that control breathing. 3. Mixed sleep apnea--A combination of both obstructive and central sleep apnea. RISK FACTORS The following risk factors can increase your risk of developing sleep apnea:  Being overweight.  Smoking.  Having narrow passages in your nose and throat.  Being of older age.  Being male.  Alcohol use.  Sedative and tranquilizer use.  Ethnicity. Among individuals younger than 35 years, African Americans are at increased risk of sleep apnea. SYMPTOMS   Difficulty staying asleep.  Daytime sleepiness and fatigue.  Loss of energy.  Irritability.  Loud, heavy snoring.  Morning headaches.  Trouble concentrating.  Forgetfulness.  Decreased interest in sex.  Unexplained sleepiness. DIAGNOSIS  In order to diagnose sleep apnea, your caregiver will perform a physical examination. A sleep study done in the comfort of your own home may be appropriate if you are otherwise healthy. Your caregiver may also recommend that you spend the  night in a sleep lab. In the sleep lab, several monitors record information about your heart, lungs, and brain while you sleep. Your leg and arm movements and blood oxygen level are also recorded. TREATMENT The following actions may help to resolve mild sleep apnea:  Sleeping on your side.   Using a decongestant if you have nasal congestion.   Avoiding the use of depressants, including alcohol, sedatives, and narcotics.   Losing weight and modifying your diet if you are overweight. There also are devices and treatments to help open your airway:  Oral appliances. These are custom-made mouthpieces that shift your lower jaw forward and slightly open your bite. This opens your airway.  Devices that create positive airway pressure. This positive pressure "splints" your airway open to help you breathe better during sleep. The following devices create positive airway pressure:  Continuous positive airway pressure (CPAP) device. The CPAP device creates a continuous level of air pressure with an air pump. The air is delivered to your airway through a mask while you sleep. This continuous pressure keeps your airway open.  Nasal expiratory positive airway pressure (EPAP) device. The EPAP device creates positive air pressure as you exhale. The device consists of single-use valves, which are inserted into each nostril and held in place by adhesive. The valves create very little resistance when you inhale but create much more resistance when you exhale. That increased resistance creates the positive airway pressure. This positive pressure while you exhale keeps your airway open, making it easier to breath when you inhale again.  Bilevel positive airway pressure (BPAP) device. The BPAP device is used mainly in patients with central sleep apnea. This device is similar to the CPAP device because   it also uses an air pump to deliver continuous air pressure through a mask. However, with the BPAP machine, the  pressure is set at two different levels. The pressure when you exhale is lower than the pressure when you inhale.  Surgery. Typically, surgery is only done if you cannot comply with less invasive treatments or if the less invasive treatments do not improve your condition. Surgery involves removing excess tissue in your airway to create a wider passage way.   This information is not intended to replace advice given to you by your health care provider. Make sure you discuss any questions you have with your health care provider.   Document Released: 04/19/2002 Document Revised: 05/20/2014 Document Reviewed: 09/05/2011 Elsevier Interactive Patient Education 2016 Elsevier Inc.   

## 2016-01-08 NOTE — Progress Notes (Signed)
Subjective:     Patient ID: Donald Pope, male   DOB: 06/18/1945, 70 y.o.   MRN: [id number]  HPI Patient here to discuss obstructive sleep apnea. He states this was diagnosed over 20 years ago and initially he was prescribed CPAP by ENT physician. He has significant snoring and history of obstructive apnea and consistently uses CPAP. His current equipment was obtained over 6 years ago. He currently has broken mask and needs replacement.  He uses CPAP basically 100% of time and over the past week since his equipment has been broken and he has had significant fatigue, dry mouth, and daytime somnolence when not using CPAP. He has noted immediate improvements in the past when going back on CPAP.  Chronic low back pain with some radiculopathy symptoms and lumbar stenosis. He has seen pain managementin the past is currently treated with Cymbalta and gabapentin and that is controlling his night pain fairly well. He would like to transition over to [place] refilling his gabapentin if possible. No lower extremity weakness.  Past Medical History:  Diagnosis Date  . Allergy   . Benign prostatic hypertrophy    possible  . CAD, NATIVE VESSEL 10/23/2009  . DEPRESSION 09/13/2008  . False positive stress test    history of, suggestive of inferior ischemia  . GERD 09/13/2008  . HYPERLIPIDEMIA 09/13/2008  . OBSTRUCTIVE SLEEP APNEA 10/23/2009   cpap  . RBBB 10/26/2009   [place]  . Spinal stenosis of lumbar region 08/18/2012  . THROMBOCYTOPENIA 09/13/2008   Past Surgical History:  Procedure Laterality Date  . CARDIAC CATHETERIZATION     10 yrs ago  . COLONOSCOPY    . MAXIMUM ACCESS (MAS)POSTERIOR LUMBAR INTERBODY FUSION (PLIF) 1 LEVEL N/A 05/27/2013   Procedure: FOR MAXIMUM ACCESS (MAS) POSTERIOR LUMBAR INTERBODY FUSION (PLIF) 1 LEVEL ,LUMBAR FOUR-FIVE;  Surgeon: Eustace Biggers, MD;  Location: [facility];  Service: Neurosurgery;  Laterality: N/A;  FOR MAXIMUM ACCESS (MAS) POSTERIOR LUMBAR INTERBODY FUSION (PLIF) 1  LEVEL ,LUMBAR FOUR-FIVE  . NASAL POLYP EXCISION    . TONSILLECTOMY      reports that he has never smoked. He has never used smokeless tobacco. He reports that he drinks about 3.0 oz of alcohol per week . He reports that he does not use drugs. family history includes COPD in his father; Heart disease in his father; Heart disease (age of onset: 90) in his mother; Hyperlipidemia in his sister; Hypertension in his sister. Allergies  Allergen Reactions  . Adhesive [Tape] Rash  . Codeine Sulfate Other (See Comments)    urination problems  . Latex Rash     Review of Systems  Constitutional: Positive for fatigue (since CPAP broken).  Eyes: Negative for visual disturbance.  Respiratory: Negative for cough, chest tightness and shortness of breath.   Cardiovascular: Negative for chest pain, palpitations and leg swelling.  Musculoskeletal: Positive for back pain.  Neurological: Negative for dizziness, syncope, weakness, light-headedness and headaches.       Objective:   Physical Exam  Constitutional: He is oriented to person, place, and time. He appears well-developed and well-nourished.  HENT:  Mouth/Throat: Oropharynx is clear and moist.  Neck: Neck supple. No thyromegaly present.  Cardiovascular: Normal rate and regular rhythm.   Pulmonary/Chest: Effort normal and breath sounds normal. No respiratory distress. He has no wheezes. He has no rales.  Musculoskeletal: He exhibits no edema.  Neurological: He is alert and oriented to person, place, and time.       Assessment:     #  1 obstructive sleep apnea. His equipment (face mask) is currently broken. He has history of excellent compliance and has noted immediate symptomatic improvement when using CPAP and has had significant symptoms of fatigue and daytime somnolence when not using this.  #2 chronic back pain stable on gabapentin and Cymbalta    Plan:     -Patient is a process of getting replacement CPAP equipment -We agreed to  refill his gabapentin but he is not currently due for refills at this time -Continue with yearly physical exam  [name] MD [facility] at [facility]

## 2016-01-08 NOTE — Progress Notes (Signed)
Pre visit review using our clinic review tool, if applicable. No additional management support is needed unless otherwise documented below in the visit note. 

## 2016-01-09 ENCOUNTER — Other Ambulatory Visit: Payer: Self-pay | Admitting: Family Medicine

## 2016-01-11 DIAGNOSIS — G4733 Obstructive sleep apnea (adult) (pediatric): Secondary | ICD-10-CM | POA: Diagnosis not present

## 2016-01-26 ENCOUNTER — Telehealth: Payer: Self-pay | Admitting: Family Medicine

## 2016-01-26 MED ORDER — GABAPENTIN 300 MG PO CAPS: 300.0000 mg | ORAL_CAPSULE | Freq: Four times a day (QID) | ORAL | 5 refills | Status: DC

## 2016-01-26 NOTE — Telephone Encounter (Signed)
° °  Pt call to say that Dr B told him he would take over the below rx    gabapentin (NEURONTIN) 300 MG capsule  Take 1 to 4 capsules every evening as needed   He call to have the rx sent to     [place]

## 2016-01-26 NOTE — Telephone Encounter (Signed)
Medication sent in for patient. 

## 2016-02-10 DIAGNOSIS — G4733 Obstructive sleep apnea (adult) (pediatric): Secondary | ICD-10-CM | POA: Diagnosis not present

## 2016-02-26 DIAGNOSIS — D2261 Melanocytic nevi of right upper limb, including shoulder: Secondary | ICD-10-CM | POA: Diagnosis not present

## 2016-02-26 DIAGNOSIS — L820 Inflamed seborrheic keratosis: Secondary | ICD-10-CM | POA: Diagnosis not present

## 2016-02-26 DIAGNOSIS — L821 Other seborrheic keratosis: Secondary | ICD-10-CM | POA: Diagnosis not present

## 2016-02-26 DIAGNOSIS — D225 Melanocytic nevi of trunk: Secondary | ICD-10-CM | POA: Diagnosis not present

## 2016-02-26 DIAGNOSIS — D2272 Melanocytic nevi of left lower limb, including hip: Secondary | ICD-10-CM | POA: Diagnosis not present

## 2016-02-26 DIAGNOSIS — D2271 Melanocytic nevi of right lower limb, including hip: Secondary | ICD-10-CM | POA: Diagnosis not present

## 2016-02-26 DIAGNOSIS — L218 Other seborrheic dermatitis: Secondary | ICD-10-CM | POA: Diagnosis not present

## 2016-02-26 DIAGNOSIS — Z85828 Personal history of other malignant neoplasm of skin: Secondary | ICD-10-CM | POA: Diagnosis not present

## 2016-03-12 DIAGNOSIS — G4733 Obstructive sleep apnea (adult) (pediatric): Secondary | ICD-10-CM | POA: Diagnosis not present

## 2016-04-11 DIAGNOSIS — G4733 Obstructive sleep apnea (adult) (pediatric): Secondary | ICD-10-CM | POA: Diagnosis not present

## 2016-04-26 ENCOUNTER — Encounter: Payer: Self-pay | Admitting: Internal Medicine

## 2016-05-12 DIAGNOSIS — G4733 Obstructive sleep apnea (adult) (pediatric): Secondary | ICD-10-CM | POA: Diagnosis not present

## 2016-06-10 ENCOUNTER — Ambulatory Visit (AMBULATORY_SURGERY_CENTER): Payer: Self-pay

## 2016-06-10 VITALS — Ht 68.0 in | Wt 200.0 lb

## 2016-06-10 DIAGNOSIS — Z860100 Personal history of colon polyps, unspecified: Secondary | ICD-10-CM

## 2016-06-10 MED ORDER — SUPREP BOWEL PREP KIT 17.5-3.13-1.6 GM/177ML PO SOLN: 1.0000 | Freq: Once | ORAL | 0 refills | Status: AC

## 2016-06-10 NOTE — Progress Notes (Signed)
[facility] CPAP No home oxygen No diet meds No past problems with anesthesia  Declined emmi

## 2016-06-12 DIAGNOSIS — G4733 Obstructive sleep apnea (adult) (pediatric): Secondary | ICD-10-CM | POA: Diagnosis not present

## 2016-06-27 ENCOUNTER — Ambulatory Visit (AMBULATORY_SURGERY_CENTER): Payer: PPO | Admitting: Internal Medicine

## 2016-06-27 ENCOUNTER — Encounter: Payer: Self-pay | Admitting: Internal Medicine

## 2016-06-27 VITALS — BP 109/75 | HR 63 | Temp 98.9°F | Resp 28 | Ht 68.0 in | Wt 200.0 lb

## 2016-06-27 DIAGNOSIS — Z86010 Personal history of colonic polyps: Secondary | ICD-10-CM

## 2016-06-27 DIAGNOSIS — D122 Benign neoplasm of ascending colon: Secondary | ICD-10-CM

## 2016-06-27 DIAGNOSIS — Z1211 Encounter for screening for malignant neoplasm of colon: Secondary | ICD-10-CM | POA: Diagnosis not present

## 2016-06-27 HISTORY — PX: COLONOSCOPY: SHX174

## 2016-06-27 MED ORDER — SODIUM CHLORIDE 0.9 % IV SOLN: 500.0000 mL | INTRAVENOUS | Status: DC

## 2016-06-27 NOTE — Patient Instructions (Signed)
Discharge instructions given. Handouts on polyps,diverticulosis and hemorrhoids. Resume previous medications. YOU HAD AN ENDOSCOPIC PROCEDURE TODAY AT THE [facility]:   Refer to the procedure report that was given to you for any specific questions about what was found during the examination.  If the procedure report does not answer your questions, please call your gastroenterologist to clarify.  If you requested that your care partner not be given the details of your procedure findings, then the procedure report has been included in a sealed envelope for you to review at your convenience later.  YOU SHOULD EXPECT: Some feelings of bloating in the abdomen. Passage of more gas than usual.  Walking can help get rid of the air that was put into your GI tract during the procedure and reduce the bloating. If you had a lower endoscopy (such as a colonoscopy or flexible sigmoidoscopy) you may notice spotting of blood in your stool or on the toilet paper. If you underwent a bowel prep for your procedure, you may not have a normal bowel movement for a few days.  Please Note:  You might notice some irritation and congestion in your nose or some drainage.  This is from the oxygen used during your procedure.  There is no need for concern and it should clear up in a day or so.  SYMPTOMS TO REPORT IMMEDIATELY:   Following lower endoscopy (colonoscopy or flexible sigmoidoscopy):  Excessive amounts of blood in the stool  Significant tenderness or worsening of abdominal pains  Swelling of the abdomen that is new, acute  Fever of 100F or higher   For urgent or emergent issues, a gastroenterologist can be reached at any hour by calling [phone].   DIET:  We do recommend a small meal at first, but then you may proceed to your regular diet.  Drink plenty of fluids but you should avoid alcoholic beverages for 24 hours.  ACTIVITY:  You should plan to take it easy for the rest of today and you  should NOT DRIVE or use heavy machinery until tomorrow (because of the sedation medicines used during the test).    FOLLOW UP: Our staff will call the number listed on your records the next business day following your procedure to check on you and address any questions or concerns that you may have regarding the information given to you following your procedure. If we do not reach you, we will leave a message.  However, if you are feeling well and you are not experiencing any problems, there is no need to return our call.  We will assume that you have returned to your regular daily activities without incident.  If any biopsies were taken you will be contacted by phone or by letter within the next 1-3 weeks.  Please call us at [phone] if you have not heard about the biopsies in 3 weeks.    SIGNATURES/CONFIDENTIALITY: You and/or your care partner have signed paperwork which will be entered into your electronic medical record.  These signatures attest to the fact that that the information above on your After Visit Summary has been reviewed and is understood.  Full responsibility of the confidentiality of this discharge information lies with you and/or your care-partner. 

## 2016-06-27 NOTE — Progress Notes (Signed)
A and O x3. Report to RN. Tolerated MAC anesthesia well.

## 2016-06-27 NOTE — Op Note (Signed)
[name]: Donald Pope Procedure Date: 06/27/2016 11:02 AM MRN: [id number] Endoscopist: Jerene Bears , MD Age: 71 Referring MD:  Date of Birth: 12/18/1945 Gender: Male Account #: [id number] Procedure:                Colonoscopy Indications:              Surveillance: Personal history of adenomatous                            polyps on last colonoscopy 3 years ago (last                            colonoscopy with poor preparation) Medicines:                Monitored Anesthesia Care Procedure:                Pre-Anesthesia Assessment:                           - Prior to the procedure, a History and Physical                            was performed, and patient medications and                            allergies were reviewed. The patient's tolerance of                            previous anesthesia was also reviewed. The risks                            and benefits of the procedure and the sedation                            options and risks were discussed with the patient.                            All questions were answered, and informed consent                            was obtained. Prior Anticoagulants: The patient has                            taken no previous anticoagulant or antiplatelet                            agents. ASA Grade Assessment: II - A patient with                            mild systemic disease. After reviewing the risks                            and benefits, the patient was deemed in  satisfactory condition to undergo the procedure.                           After obtaining informed consent, the colonoscope                            was passed under direct vision. Throughout the                            procedure, the patient's blood pressure, pulse, and                            oxygen saturations were monitored continuously. The                            Model CF-HQ190L [id number]) scope was  introduced                            through the anus and advanced to the the cecum,                            identified by appendiceal orifice and ileocecal                            valve. The colonoscopy was performed without                            difficulty. The patient tolerated the procedure                            well. The quality of the bowel preparation was                            good. The ileocecal valve, appendiceal orifice, and                            rectum were photographed. The bowel preparation                            used was 2 day SuPrep. Scope In: 11:11:46 AM Scope Out: 11:32:35 AM Scope Withdrawal Time: 0 hours 15 minutes 5 seconds  Total Procedure Duration: 0 hours 20 minutes 49 seconds  Findings:                 The digital rectal exam was normal.                           Two sessile polyps were found in the ascending                            colon. The polyps were 3 to 5 mm in size. These                            polyps were removed with a cold snare. Resection  and retrieval were complete.                           A 10 mm polyp was found in the ascending colon. The                            polyp was flat. The polyp was removed with a cold                            snare. Resection and retrieval were complete.                           Scattered medium-mouthed diverticula were found in                            the sigmoid colon and descending colon.                           Internal hemorrhoids were found during                            retroflexion. The hemorrhoids were medium-sized. Complications:            No immediate complications. Estimated Blood Loss:     Estimated blood loss was minimal. Impression:               - Two 3 to 5 mm polyps in the ascending colon,                            removed with a cold snare. Resected and retrieved.                           - One 10 mm polyp in the  ascending colon, removed                            with a cold snare. Resected and retrieved.                           - Mild diverticulosis in the sigmoid colon and in                            the descending colon.                           - Internal hemorrhoids. Recommendation:           - Patient has a contact number available for                            emergencies. The signs and symptoms of potential                            delayed complications were discussed with the  patient. Return to normal activities tomorrow.                            Written discharge instructions were provided to the                            patient.                           - Resume previous diet.                           - Continue present medications.                           - Await pathology results.                           - Repeat colonoscopy is recommended for                            surveillance. The colonoscopy date will be                            determined after pathology results from today's                            exam become available for review. Jerene Bears, MD 06/27/2016 11:39:43 AM This report has been signed electronically.

## 2016-06-27 NOTE — Progress Notes (Signed)
Called to room to assist during endoscopic procedure.  Patient ID and intended procedure confirmed with present staff. Received instructions for my participation in the procedure from the performing physician.  

## 2016-06-28 ENCOUNTER — Telehealth: Payer: Self-pay

## 2016-06-28 NOTE — Telephone Encounter (Signed)
  Follow up Call-  Call back number 06/27/2016  Post procedure Call Back phone  # [phone]  Permission to leave phone message Yes  Some recent data might be hidden    Patient was called for follow up after his procedure on 06/27/2016. No answer at the number given for follow up phone call. A message was left on the answering machine.

## 2016-06-28 NOTE — Telephone Encounter (Signed)
Number identifier, left voicemail we will call you back later today.

## 2016-07-02 ENCOUNTER — Encounter: Payer: Self-pay | Admitting: Internal Medicine

## 2016-07-03 ENCOUNTER — Telehealth: Payer: Self-pay | Admitting: Internal Medicine

## 2016-07-03 NOTE — Telephone Encounter (Signed)
Discussed with pt that he could try miralax 2-3 doses to have bowel movement. Pt instructed to call back if unable to have BM.

## 2016-07-10 DIAGNOSIS — G4733 Obstructive sleep apnea (adult) (pediatric): Secondary | ICD-10-CM | POA: Diagnosis not present

## 2016-07-30 ENCOUNTER — Other Ambulatory Visit: Payer: Self-pay | Admitting: Family Medicine

## 2016-08-10 DIAGNOSIS — G4733 Obstructive sleep apnea (adult) (pediatric): Secondary | ICD-10-CM | POA: Diagnosis not present

## 2016-08-24 ENCOUNTER — Other Ambulatory Visit: Payer: Self-pay | Admitting: Family Medicine

## 2016-09-09 DIAGNOSIS — G4733 Obstructive sleep apnea (adult) (pediatric): Secondary | ICD-10-CM | POA: Diagnosis not present

## 2016-09-23 DIAGNOSIS — L820 Inflamed seborrheic keratosis: Secondary | ICD-10-CM | POA: Diagnosis not present

## 2016-09-23 DIAGNOSIS — L821 Other seborrheic keratosis: Secondary | ICD-10-CM | POA: Diagnosis not present

## 2016-09-23 DIAGNOSIS — Z85828 Personal history of other malignant neoplasm of skin: Secondary | ICD-10-CM | POA: Diagnosis not present

## 2016-09-23 DIAGNOSIS — L218 Other seborrheic dermatitis: Secondary | ICD-10-CM | POA: Diagnosis not present

## 2016-09-25 DIAGNOSIS — M9901 Segmental and somatic dysfunction of cervical region: Secondary | ICD-10-CM | POA: Diagnosis not present

## 2016-10-03 DIAGNOSIS — M9901 Segmental and somatic dysfunction of cervical region: Secondary | ICD-10-CM | POA: Diagnosis not present

## 2016-10-10 DIAGNOSIS — G4733 Obstructive sleep apnea (adult) (pediatric): Secondary | ICD-10-CM | POA: Diagnosis not present

## 2016-10-27 ENCOUNTER — Other Ambulatory Visit: Payer: Self-pay | Admitting: Family Medicine

## 2016-11-08 ENCOUNTER — Encounter: Payer: Self-pay | Admitting: Family Medicine

## 2016-11-09 DIAGNOSIS — G4733 Obstructive sleep apnea (adult) (pediatric): Secondary | ICD-10-CM | POA: Diagnosis not present

## 2016-12-09 ENCOUNTER — Telehealth: Payer: Self-pay | Admitting: *Deleted

## 2016-12-09 MED ORDER — GABAPENTIN 300 MG PO CAPS: ORAL_CAPSULE | ORAL | 0 refills | Status: DC

## 2016-12-09 NOTE — Telephone Encounter (Signed)
gabapentin (NEURONTIN) 300 MG capsule Last refill 06/14/16 and last office visit 10/10/15.  Okay to fill?

## 2016-12-09 NOTE — Telephone Encounter (Signed)
Refill sent.

## 2016-12-09 NOTE — Telephone Encounter (Signed)
Needs office follow-up. Refill once until follow-up 

## 2016-12-10 DIAGNOSIS — G4733 Obstructive sleep apnea (adult) (pediatric): Secondary | ICD-10-CM | POA: Diagnosis not present

## 2016-12-12 ENCOUNTER — Other Ambulatory Visit: Payer: Self-pay | Admitting: Family Medicine

## 2016-12-12 MED ORDER — GABAPENTIN 300 MG PO CAPS: ORAL_CAPSULE | ORAL | 2 refills | Status: DC

## 2017-01-10 DIAGNOSIS — G4733 Obstructive sleep apnea (adult) (pediatric): Secondary | ICD-10-CM | POA: Diagnosis not present

## 2017-01-30 ENCOUNTER — Encounter: Payer: Self-pay | Admitting: Family Medicine

## 2017-03-04 DIAGNOSIS — H52223 Regular astigmatism, bilateral: Secondary | ICD-10-CM | POA: Diagnosis not present

## 2017-03-04 DIAGNOSIS — H5203 Hypermetropia, bilateral: Secondary | ICD-10-CM | POA: Diagnosis not present

## 2017-03-04 DIAGNOSIS — H43813 Vitreous degeneration, bilateral: Secondary | ICD-10-CM | POA: Diagnosis not present

## 2017-03-04 DIAGNOSIS — H524 Presbyopia: Secondary | ICD-10-CM | POA: Diagnosis not present

## 2017-03-09 ENCOUNTER — Other Ambulatory Visit: Payer: Self-pay | Admitting: Family Medicine

## 2017-03-10 DIAGNOSIS — Z85828 Personal history of other malignant neoplasm of skin: Secondary | ICD-10-CM | POA: Diagnosis not present

## 2017-03-10 DIAGNOSIS — L821 Other seborrheic keratosis: Secondary | ICD-10-CM | POA: Diagnosis not present

## 2017-03-10 DIAGNOSIS — L570 Actinic keratosis: Secondary | ICD-10-CM | POA: Diagnosis not present

## 2017-03-10 DIAGNOSIS — D1801 Hemangioma of skin and subcutaneous tissue: Secondary | ICD-10-CM | POA: Diagnosis not present

## 2017-03-10 DIAGNOSIS — L812 Freckles: Secondary | ICD-10-CM | POA: Diagnosis not present

## 2017-03-10 DIAGNOSIS — L820 Inflamed seborrheic keratosis: Secondary | ICD-10-CM | POA: Diagnosis not present

## 2017-03-10 DIAGNOSIS — L218 Other seborrheic dermatitis: Secondary | ICD-10-CM | POA: Diagnosis not present

## 2017-04-14 ENCOUNTER — Other Ambulatory Visit: Payer: Self-pay | Admitting: Internal Medicine

## 2017-04-14 NOTE — Telephone Encounter (Signed)
Dr burchette pt 

## 2017-05-13 NOTE — Progress Notes (Addendum)
Subjective:   Donald Pope is a 72 y.o. male who presents for Medicare Annual/Subsequent preventive examination.  Married with spouse 1st grand-baby; little boy Children only one   Diet Trig 333 Educated on lowering Levels went up 02/2015 from 08/2014 Has not been checked since; taking crestor  Having some muscle aches for years. Has gone off statins for 30 days and still has myalgia;  Painful calves, may refer to the [place]  Has gained holiday weight Breakfast bowl of oatmeal Lunch; goes out at times Supper good dinner and his wife cooks   BMI 31  Exercise HDL 35 YOGA 2 times a week [facility]; deep water exercise Regular yoga on wed and saturdays   No lab work since 02/2015    Pain in lower legs; ? Myalgia and doesn't allow him to fall asleep     Objective:    Vitals: BP 120/90   Pulse 79   Ht 5\' 8"  (1.727 m)   Wt 204 lb (92.5 kg)   SpO2 98%   BMI 31.02 kg/m   Body mass index is 31.02 kg/m.   [facility]; 130/90  Advanced Directives 05/14/2017 06/10/2016 05/17/2013  Does Patient Have a Medical Advance Directive? Yes Yes Patient has advance directive, copy in chart  Type of Advance Directive - [place];Living will -    Tobacco Social History   Tobacco Use  Smoking Status Never Smoker  Smokeless Tobacco Never Used     Counseling given: Yes   Clinical Intake:     Past Medical History:  Diagnosis Date  . Allergy   . Benign prostatic hypertrophy    possible  . CAD, NATIVE VESSEL 10/23/2009  . DEPRESSION 09/13/2008  . False positive stress test    history of, suggestive of inferior ischemia  . GERD 09/13/2008  . HYPERLIPIDEMIA 09/13/2008  . OBSTRUCTIVE SLEEP APNEA 10/23/2009   cpap  . RBBB 10/26/2009   Donald Pope  . Spinal stenosis of lumbar region 08/18/2012  . THROMBOCYTOPENIA 09/13/2008   Past Surgical History:  Procedure Laterality Date  . CARDIAC CATHETERIZATION     10 yrs ago  . COLONOSCOPY      . MAXIMUM ACCESS (MAS)POSTERIOR LUMBAR INTERBODY FUSION (PLIF) 1 LEVEL N/A 05/27/2013   Procedure: FOR MAXIMUM ACCESS (MAS) POSTERIOR LUMBAR INTERBODY FUSION (PLIF) 1 LEVEL ,LUMBAR FOUR-FIVE;  Surgeon: Donald Berges, MD;  Location: [facility];  Service: Neurosurgery;  Laterality: N/A;  FOR MAXIMUM ACCESS (MAS) POSTERIOR LUMBAR INTERBODY FUSION (PLIF) 1 LEVEL ,LUMBAR FOUR-FIVE  . NASAL POLYP EXCISION    . TONSILLECTOMY     Family History  Problem Relation Age of Onset  . Heart disease Mother 26       CHF  . Heart disease Father   . COPD Father   . Hypertension Sister   . Hyperlipidemia Sister   . Colon cancer Neg Hx   . Stomach cancer Neg Hx    Social History   Socioeconomic History  . Marital status: Married    Spouse name: Not on file  . Number of children: Not on file  . Years of education: Not on file  . Highest education level: Not on file  Social Needs  . Financial resource strain: Not on file  . Food insecurity - worry: Not on file  . Food insecurity - inability: Not on file  . Transportation needs - medical: Not on file  . Transportation needs -  non-medical: Not on file  Occupational History  . Not on file  Tobacco Use  . Smoking status: Never Smoker  . Smokeless tobacco: Never Used  Substance and Sexual Activity  . Alcohol use: Yes    Alcohol/week: 3.0 oz    Types: 5 Glasses of wine per week    Comment: daily or 2 to 3 times a week (red week)   . Drug use: No  . Sexual activity: Not on file  Other Topics Concern  . Not on file  Social History Narrative  . Not on file    Outpatient Encounter Medications as of 05/14/2017  Medication Sig  . aspirin 81 MG EC tablet Take 81 mg by mouth daily.    . cetirizine (ZYRTEC) 10 MG tablet Takes one tablet every other day.  . cholecalciferol (VITAMIN D) 1000 UNITS tablet Take 1,000 Units by mouth daily.   . DULoxetine (CYMBALTA) 20 MG capsule Take 20 mg by mouth 2 (two) times daily. Take 2 tablets every night.  .  fluticasone (FLONASE) 50 MCG/ACT nasal spray Place 2 sprays into the nose daily.    Marland Kitchen gabapentin (NEURONTIN) 300 MG capsule TAKE ONE CAPSULE BY MOUTH 4 TIMES A DAY  . Melatonin 3 MG TABS Take by mouth.  . Multiple Vitamin (MULTIVITAMIN) tablet Take 1 tablet by mouth daily.  Marland Kitchen omeprazole (PRILOSEC) 20 MG capsule Take 20 mg by mouth every other day.   . rosuvastatin (CRESTOR) 10 MG tablet Take 1 tablet (10 mg total) by mouth daily.  . citalopram (CELEXA) 10 MG tablet TAKE 1 TABLET BY MOUTH EVERY DAY (Patient not taking: Reported on 05/14/2017)   Facility-Administered Encounter Medications as of 05/14/2017  Medication  . 0.9 %  sodium chloride infusion    Activities of Daily Living In your present state of health, do you have any difficulty performing the following activities: 05/14/2017  Hearing? N  Comment may be some in left ear  Vision? N  Comment readers   Difficulty concentrating or making decisions? N  Walking or climbing stairs? N  Dressing or bathing? N  Doing errands, shopping? N  Preparing Food and eating ? N  Using the Toilet? N  In the past six months, have you accidently leaked urine? N  Comment hesitancy   Do you have problems with loss of bowel control? N  Managing your Medications? N  Managing your Finances? N  Housekeeping or managing your Housekeeping? N  Some recent data might be hidden    Patient Care Team: Donald Post, MD as PCP - General   Assessment:   This is a routine wellness examination for Donald Pope.  Exercise Activities and Dietary recommendations Current Exercise Habits: Home exercise routine, Type of exercise: stretching;yoga;strength training/weights;treadmill, Time (Minutes): 60, Frequency (Times/Week): 5, Weekly Exercise (Minutes/Week): 300, Intensity: Moderate  Goals    . Weight (lb) < 190 lb (86.2 kg)     Check out  online nutrition programs as GumSearch.nl and http://vang.com/; fit25me; Look for foods with "whole" wheat; bran; oatmeal  etc Shot at the farmer's markets in season for fresher choices  Watch for "hydrogenated" on the label of oils which are trans-fats.  Watch for "high fructose corn syrup" in snacks, yogurt or ketchup  Meats have less marbling; bright colored fruits and vegetables;  Canned; dump out liquid and wash vegetables. Be mindful of what we are eating  Portion control is essential to a health weight! Sit down; take a break and enjoy your meal; take smaller bites;  put the fork down between bites;  It takes 20 minutes to get full; so check in with your fullness cues and stop eating when you start to fill full              Fall Risk Fall Risk  05/14/2017 03/29/2015  Falls in the past year? No No   Lives in one level home Plans to stay there Walk in shower yes   Depression Screen PHQ 2/9 Scores 05/14/2017 03/29/2015  PHQ - 2 Score 0 0   Just with pain  Cognitive Function Ad8 score reviewed for issues:  Issues making decisions:  Less interest in hobbies / activities:  Repeats questions, stories (family complaining):  Trouble using ordinary gadgets (microwave, computer, phone):  Forgets the month or year:   Mismanaging finances:   Remembering appts:  Daily problems with thinking and/or memory: Ad8 score is=0   Urinary hesitancy; more at hs than during the day          Immunization History  Administered Date(s) Administered  . Influenza Split 03/18/2012  . Influenza Whole 03/13/2010  . Influenza, High Dose Seasonal PF 04/05/2013, 03/08/2015  . Influenza,inj,Quad PF,6+ Mos 01/28/2014  . Pneumococcal Conjugate-13 01/28/2014  . Pneumococcal Polysaccharide-23 11/19/2010  . Td 09/20/2009  . Zoster 01/01/2013    Qualifies for Shingles Vaccine? Educated   Screening Tests Health Maintenance  Topic Date Due  . Hepatitis C Screening  19-Jul-1945  . INFLUENZA VACCINE  12/11/2016  . COLONOSCOPY  06/28/2019  . TETANUS/TDAP  09/21/2019  . PNA vac Low Risk Adult   Completed    Plan:      PCP Notes   Health Maintenance Discussed Hep C and possible had this at the [facility] but will check with the [facility] before he sees Dr. Elease Hashimoto     Abnormal Screens BMI 31; is trying to cut back on sugar and lose weight Given [facility] resources for tracking if he desires to do so.   High triglycerides - educated regarding ETOH and triglycerides as well as low fat diet.   May have issue with hearing loss in left ear; can have hearing test at the [place] as well for baseline  Also has new grand-baby; discussed [facility] but [facility] as he had the TD here. If not, he can take a tdap anytime.   Referrals  None but does go to the [place] 2 times per year Updated meds   Patient concerns; Still c/o of myalgia or neuropathy like symptoms in lower legs. Has stopped crestor in the past but symptoms did not resolve. Continues crestor.   [facility] Celex[facility] and placed him on 30 mg of Cymbalta    Nurse Concerns; No lipids since 03/02/2015; agreed to make apt with Dr. Elease Hashimoto for medical fup   Next PCP apt 06/16/2017   I have personally reviewed and noted the following in the patient's chart:   . Medical and social history . Use of alcohol, tobacco or illicit drugs  . Current medications and supplements . Functional ability and status . Nutritional status . Physical activity . Advanced directives . List of other physicians . Hospitalizations, surgeries, and ER visits in previous 12 months . Vitals . Screenings to include cognitive, depression, and falls . Referrals and appointments  In addition, I have reviewed and discussed with patient certain preventive protocols, quality metrics, and best practice recommendations. A written personalized care plan for preventive  services as well as general preventive health recommendations were provided to patient.     LVDIX,VEZBM, RN  05/14/2017  Above assessment reviewed.  Needs  follow up on lipids.  Will address at follow up if not checked through the [place].  Donald Post MD [facility]

## 2017-05-14 ENCOUNTER — Ambulatory Visit (INDEPENDENT_AMBULATORY_CARE_PROVIDER_SITE_OTHER): Payer: PPO

## 2017-05-14 ENCOUNTER — Ambulatory Visit: Payer: PPO

## 2017-05-14 VITALS — BP 120/90 | HR 79 | Ht 68.0 in | Wt 204.0 lb

## 2017-05-14 DIAGNOSIS — Z0000 Encounter for general adult medical examination without abnormal findings: Secondary | ICD-10-CM

## 2017-05-14 DIAGNOSIS — Z1159 Encounter for screening for other viral diseases: Secondary | ICD-10-CM

## 2017-05-14 NOTE — Patient Instructions (Addendum)
Donald Pope , Thank you for taking time to come for your Medicare Wellness Visit. I appreciate your ongoing commitment to your health goals. Please review the following plan we discussed and let me know if I can assist you in the future.   Will schedule with Dr. Elease Hashimoto for labs   Will have ears checked at the [facility] for baseline hearing issues  A Tetanus is recommended every 10 years. Medicare covers a tetanus if you have a cut or wound; otherwise, there may be a charge. If you had not had a tetanus with pertusses, known as the Tdap, you can take this anytime.  You had one here May 2011 and it was a plain TD, so you can have a tdap at anytime.  Check with VA to see if they gave you a TDAP    Veterans want to access their VA benefits can call [facility]  [address], [place], [place] [address]  Phone: [phone] Or [phone] Fax: [phone]  OR   They can to to the [facility] office and call [phone] and If the prompt starts, hit 0 and then ask for eligibility. They will assist you to sign up for medical benefits.    These are the goals we discussed: Goals    . Weight (lb) < 190 lb (86.2 kg)     Check out  online nutrition programs as GumSearch.nl and http://vang.com/; fit56me; Look for foods with "whole" wheat; bran; oatmeal etc Shot at the farmer's markets in season for fresher choices  Watch for "hydrogenated" on the label of oils which are trans-fats.  Watch for "high fructose corn syrup" in snacks, yogurt or ketchup  Meats have less marbling; bright colored fruits and vegetables;  Canned; dump out liquid and wash vegetables. Be mindful of what we are eating  Portion control is essential to a health weight! Sit down; take a break and enjoy your meal; take smaller bites; put the fork down between bites;  It takes 20 minutes to get full; so check in with your fullness cues and stop eating when you start to fill full              This is a list of the screening recommended for you and due dates:  Health Maintenance  Topic Date Due  .  Hepatitis C: One time screening is recommended by Center for Disease Control  (CDC) for  adults born from [birth year] through 1966.   07-08-1945  . Flu Shot  12/11/2016  . Colon Cancer Screening  06/28/2019  . Tetanus Vaccine  09/21/2019  . Pneumonia vaccines  Completed     Fat and Cholesterol Restricted Diet High levels of fat and cholesterol in your blood may lead to various health problems, such as diseases of the heart, blood vessels, gallbladder, liver, and pancreas. Fats are concentrated sources of energy that come in various forms. Certain types of fat, including saturated fat, may be harmful in excess. Cholesterol is a substance needed by your body in small amounts. Your body makes all the cholesterol it needs. Excess cholesterol comes from the food you eat. When you have high levels of cholesterol and saturated fat in your blood, health problems can develop because the excess fat and cholesterol will gather along the walls of your blood vessels, causing them to narrow. Choosing the right foods will help you control your intake of fat and cholesterol. This will help keep the levels of these substances in your blood within normal  limits and reduce your risk of disease. What is my plan? Your health care provider recommends that you:  Limit your fat intake to ______% or less of your total calories per day.  Limit the amount of cholesterol in your diet to less than _________mg per day.  Eat 20-30 grams of fiber each day.  What types of fat should I choose?  Choose healthy fats more often. Choose monounsaturated and polyunsaturated fats, such as olive and canola oil, flaxseeds, walnuts, almonds, and seeds.  Eat more omega-3 fats. Good choices include salmon, mackerel, sardines, tuna, flaxseed oil, and ground flaxseeds. Aim to eat fish at least two times a week.  Limit saturated fats.  Saturated fats are primarily found in animal products, such as meats, butter, and cream. Plant sources of saturated fats include palm oil, palm kernel oil, and coconut oil.  Avoid foods with partially hydrogenated oils in them. These contain trans fats. Examples of foods that contain trans fats are stick margarine, some tub margarines, cookies, crackers, and other baked goods. What general guidelines do I need to follow? These guidelines for healthy eating will help you control your intake of fat and cholesterol:  Check food labels carefully to identify foods with trans fats or high amounts of saturated fat.  Fill one half of your plate with vegetables and green salads.  Fill one fourth of your plate with whole grains. Look for the word "whole" as the first word in the ingredient list.  Fill one fourth of your plate with lean protein foods.  Limit fruit to two servings a day. Choose fruit instead of juice.  Eat more foods that contain fiber, such as apples, broccoli, carrots, beans, peas, and barley.  Eat more home-cooked food and less restaurant, buffet, and fast food.  Limit or avoid alcohol.  Limit foods high in starch and sugar.  Limit fried foods.  Cook foods using methods other than frying. Baking, boiling, grilling, and broiling are all great options.  Lose weight if you are overweight. Losing just 5-10% of your initial body weight can help your overall health and prevent diseases such as diabetes and heart disease.  What foods can I eat? Grains  Whole grains, such as whole wheat or whole grain breads, crackers, cereals, and pasta. Unsweetened oatmeal, bulgur, barley, quinoa, or brown rice. Corn or whole wheat flour tortillas. Vegetables  Fresh or frozen vegetables (raw, steamed, roasted, or grilled). Green salads. Fruits  All fresh, canned (in natural juice), or frozen fruits. Meats and other protein foods  Ground beef (85% or leaner), grass-fed beef, or beef trimmed  of fat. Skinless chicken or [place]. Ground chicken or [place]. Pork trimmed of fat. All fish and seafood. Eggs. Dried beans, peas, or lentils. Unsalted nuts or seeds. Unsalted canned or dry beans. Dairy  Low-fat dairy products, such as skim or 1% milk, 2% or reduced-fat cheeses, low-fat ricotta or cottage cheese, or plain low-fat yo Fats and oils  Tub margarines without trans fats. Light or reduced-fat mayonnaise and salad dressings. Avocado. Olive, canola, sesame, or safflower oils. Natural peanut or almond butter (choose ones without added sugar and oil). The items listed above may not be a complete list of recommended foods or beverages. Contact your dietitian for more options. Foods to avoid Grains  White bread. White pasta. White rice. Cornbread. Bagels, pastries, and croissants. Crackers that contain trans fat. Vegetables  White potatoes. Corn. Creamed or fried vegetables. Vegetables in a cheese sauce. Fruits  Dried fruits. Canned fruit in  light or heavy syrup. Fruit juice. Meats and other protein foods  Fatty cuts of meat. Ribs, chicken wings, bacon, sausage, bologna, salami, chitterlings, fatback, hot dogs, bratwurst, and packaged luncheon meats. Liver and organ meats. Dairy  Whole or 2% milk, cream, half-and-half, and cream cheese. Whole milk cheeses. Whole-fat or sweetened yogurt. Full-fat cheeses. Nondairy creamers and whipped toppings. Processed cheese, cheese spreads, or cheese curds. Beverages  Alcohol. Sweetened drinks (such as sodas, lemonade, and fruit drinks or punches). Fats and oils  Butter, stick margarine, lard, shortening, ghee, or bacon fat. Coconut, palm kernel, or palm oils. Sweets and desserts  Corn syrup, sugars, honey, and molasses. Candy. Jam and jelly. Syrup. Sweetened cereals. Cookies, pies, cakes, donuts, muffins, and ice cream. The items listed above may not be a complete list of foods and beverages to avoid. Contact your dietitian for more  information. This information is not intended to replace advice given to you by your health care provider. Make sure you discuss any questions you have with your health care provider. Document Released: 04/29/2005 Document Revised: 05/20/2014 Document Reviewed: 07/28/2013 Elsevier Interactive Patient Education  2018 [place] in the Home Falls can cause injuries. They can happen to people of all ages. There are many things you can do to make your home safe and to help prevent falls. What can I do on the outside of my home?  Regularly fix the edges of walkways and driveways and fix any cracks.  Remove anything that might make you trip as you walk through a door, such as a raised step or threshold.  Trim any bushes or trees on the path to your home.  Use bright outdoor lighting.  Clear any walking paths of anything that might make someone trip, such as rocks or tools.  Regularly check to see if handrails are loose or broken. Make sure that both sides of any steps have handrails.  Any raised decks and porches should have guardrails on the edges.  Have any leaves, snow, or ice cleared regularly.  Use sand or salt on walking paths during winter.  Clean up any spills in your garage right away. This includes oil or grease spills. What can I do in the bathroom?  Use night lights.  Install grab bars by the toilet and in the tub and shower. Do not use towel bars as grab bars.  Use non-skid mats or decals in the tub or shower.  If you need to sit down in the shower, use a plastic, non-slip stool.  Keep the floor dry. Clean up any water that spills on the floor as soon as it happens.  Remove soap buildup in the tub or shower regularly.  Attach bath mats securely with double-sided non-slip rug tape.  Do not have throw rugs and other things on the floor that can make you trip. What can I do in the bedroom?  Use night lights.  Make sure that you have a light  by your bed that is easy to reach.  Do not use any sheets or blankets that are too big for your bed. They should not hang down onto the floor.  Have a firm chair that has side arms. You can use this for support while you get dressed.  Do not have throw rugs and other things on the floor that can make you trip. What can I do in the kitchen?  Clean up any spills right away.  Avoid walking on wet floors.  Keep  items that you use a lot in easy-to-reach places.  If you need to reach something above you, use a strong step stool that has a grab bar.  Keep electrical cords out of the way.  Do not use floor polish or wax that makes floors slippery. If you must use wax, use non-skid floor wax.  Do not have throw rugs and other things on the floor that can make you trip. What can I do with my stairs?  Do not leave any items on the stairs.  Make sure that there are handrails on both sides of the stairs and use them. Fix handrails that are broken or loose. Make sure that handrails are as long as the stairways.  Check any carpeting to make sure that it is firmly attached to the stairs. Fix any carpet that is loose or worn.  Avoid having throw rugs at the top or bottom of the stairs. If you do have throw rugs, attach them to the floor with carpet tape.  Make sure that you have a light switch at the top of the stairs and the bottom of the stairs. If you do not have them, ask someone to add them for you. What else can I do to help prevent falls?  Wear shoes that: ? Do not have high heels. ? Have rubber bottoms. ? Are comfortable and fit you well. ? Are closed at the toe. Do not wear sandals.  If you use a stepladder: ? Make sure that it is fully opened. Do not climb a closed stepladder. ? Make sure that both sides of the stepladder are locked into place. ? Ask someone to hold it for you, if possible.  Clearly mark and make sure that you can see: ? Any grab bars or handrails. ? First  and last steps. ? Where the edge of each step is.  Use tools that help you move around (mobility aids) if they are needed. These include: ? Canes. ? Walkers. ? Scooters. ? Crutches.  Turn on the lights when you go into a dark area. Replace any light bulbs as soon as they burn out.  Set up your furniture so you have a clear path. Avoid moving your furniture around.  If any of your floors are uneven, fix them.  If there are any pets around you, be aware of where they are.  Review your medicines with your doctor. Some medicines can make you feel dizzy. This can increase your chance of falling. Ask your doctor what other things that you can do to help prevent falls. This information is not intended to replace advice given to you by your health care provider. Make sure you discuss any questions you have with your health care provider. Document Released: 02/23/2009 Document Revised: 10/05/2015 Document Reviewed: 06/03/2014 Elsevier Interactive Patient Education  2018 [place] Maintenance, Male A healthy lifestyle and preventive care is important for your health and wellness. Ask your health care provider about what schedule of regular examinations is right for you. What should I know about weight and diet? Eat a Healthy Diet  Eat plenty of vegetables, fruits, whole grains, low-fat dairy products, and lean protein.  Do not eat a lot of foods high in solid fats, added sugars, or salt.  Maintain a Healthy Weight Regular exercise can help you achieve or maintain a healthy weight. You should:  Do at least 150 minutes of exercise each week. The exercise should increase your heart rate and make you  sweat (moderate-intensity exercise).  Do strength-training exercises at least twice a week.  Watch Your Levels of Cholesterol and Blood Lipids  Have your blood tested for lipids and cholesterol every 5 years starting at 73 years of age. If you are at high risk for heart disease,  you should start having your blood tested when you are 73 years old. You may need to have your cholesterol levels checked more often if: ? Your lipid or cholesterol levels are high. ? You are older than 73 years of age. ? You are at high risk for heart disease.  What should I know about cancer screening? Many types of cancers can be detected early and may often be prevented. Lung Cancer  You should be screened every year for lung cancer if: ? You are a current smoker who has smoked for at least 30 years. ? You are a former smoker who has quit within the past 15 years.  Talk to your health care provider about your screening options, when you should start screening, and how often you should be screened.  Colorectal Cancer  Routine colorectal cancer screening usually begins at 73 years of age and should be repeated every 5-10 years until you are 73 years old. You may need to be screened more often if early forms of precancerous polyps or small growths are found. Your health care provider may recommend screening at an earlier age if you have risk factors for colon cancer.  Your health care provider may recommend using home test kits to check for hidden blood in the stool.  A small camera at the end of a tube can be used to examine your colon (sigmoidoscopy or colonoscopy). This checks for the earliest forms of colorectal cancer.  Prostate and Testicular Cancer  Depending on your age and overall health, your health care provider may do certain tests to screen for prostate and testicular cancer.  Talk to your health care provider about any symptoms or concerns you have about testicular or prostate cancer.  Skin Cancer  Check your skin from head to toe regularly.  Tell your health care provider about any new moles or changes in moles, especially if: ? There is a change in a mole's size, shape, or color. ? You have a mole that is larger than a pencil eraser.  Always use sunscreen. Apply  sunscreen liberally and repeat throughout the day.  Protect yourself by wearing long sleeves, pants, a wide-brimmed hat, and sunglasses when outside.  What should I know about heart disease, diabetes, and high blood pressure?  If you are 84-70 years of age, have your blood pressure checked every 3-5 years. If you are 9 years of age or older, have your blood pressure checked every year. You should have your blood pressure measured twice-once when you are at a hospital or clinic, and once when you are not at a hospital or clinic. Record the average of the two measurements. To check your blood pressure when you are not at a hospital or clinic, you can use: ? An automated blood pressure machine at a pharmacy. ? A home blood pressure monitor.  Talk to your health care provider about your target blood pressure.  If you are between 53-25 years old, ask your health care provider if you should take aspirin to prevent heart disease.  Have regular diabetes screenings by checking your fasting blood sugar level. ? If you are at a normal weight and have a low risk for diabetes, have this  test once every three years after the age of 26. ? If you are overweight and have a high risk for diabetes, consider being tested at a younger age or more often.  A one-time screening for abdominal aortic aneurysm (AAA) by ultrasound is recommended for men aged 36-75 years who are current or former smokers. What should I know about preventing infection? Hepatitis B If you have a higher risk for hepatitis B, you should be screened for this virus. Talk with your health care provider to find out if you are at risk for hepatitis B infection. Hepatitis C Blood testing is recommended for:  Everyone born from [birth year] through 1966.  Anyone with known risk factors for hepatitis C.  Sexually Transmitted Diseases (STDs)  You should be screened each year for STDs including gonorrhea and chlamydia if: ? You are sexually active  and are younger than 73 years of age. ? You are older than 73 years of age and your health care provider tells you that you are at risk for this type of infection. ? Your sexual activity has changed since you were last screened and you are at an increased risk for chlamydia or gonorrhea. Ask your health care provider if you are at risk.  Talk with your health care provider about whether you are at high risk of being infected with HIV. Your health care provider may recommend a prescription medicine to help prevent HIV infection.  What else can I do?  Schedule regular health, dental, and eye exams.  Stay current with your vaccines (immunizations).  Do not use any tobacco products, such as cigarettes, chewing tobacco, and e-cigarettes. If you need help quitting, ask your health care provider.  Limit alcohol intake to no more than 2 drinks per day. One drink equals 12 ounces of beer, 5 ounces of wine, or 1 ounces of hard liquor.  Do not use street drugs.  Do not share needles.  Ask your health care provider for help if you need support or information about quitting drugs.  Tell your health care provider if you often feel depressed.  Tell your health care provider if you have ever been abused or do not feel safe at home. This information is not intended to replace advice given to you by your health care provider. Make sure you discuss any questions you have with your health care provider. Document Released: 10/26/2007 Document Revised: 12/27/2015 Document Reviewed: 01/31/2015 Elsevier Interactive Patient Education  [employer].

## 2017-05-27 ENCOUNTER — Other Ambulatory Visit: Payer: Self-pay | Admitting: Internal Medicine

## 2017-05-29 ENCOUNTER — Telehealth: Payer: Self-pay | Admitting: Family Medicine

## 2017-05-29 NOTE — Telephone Encounter (Signed)
Refill request for Gabapentin, this came in on 05/27/2017 however went to wrong PCP, pt is leaving for out of town this weekend and asking if we can send in on Friday, he does have appointment with Dr. Ward Givens early February, only requesting a 30 day supply to [facility].

## 2017-05-30 MED ORDER — GABAPENTIN 300 MG PO CAPS: ORAL_CAPSULE | ORAL | 0 refills | Status: DC

## 2017-05-30 NOTE — Telephone Encounter (Signed)
Refills OK. 

## 2017-05-30 NOTE — Telephone Encounter (Signed)
rx sent

## 2017-06-07 ENCOUNTER — Other Ambulatory Visit: Payer: Self-pay | Admitting: Family Medicine

## 2017-06-16 ENCOUNTER — Ambulatory Visit (INDEPENDENT_AMBULATORY_CARE_PROVIDER_SITE_OTHER): Payer: PPO | Admitting: Family Medicine

## 2017-06-16 ENCOUNTER — Encounter: Payer: Self-pay | Admitting: Family Medicine

## 2017-06-16 VITALS — BP 120/78 | HR 77 | Temp 98.2°F | Ht 68.0 in | Wt 203.8 lb

## 2017-06-16 DIAGNOSIS — Z0000 Encounter for general adult medical examination without abnormal findings: Secondary | ICD-10-CM | POA: Diagnosis not present

## 2017-06-16 DIAGNOSIS — E785 Hyperlipidemia, unspecified: Secondary | ICD-10-CM | POA: Diagnosis not present

## 2017-06-16 DIAGNOSIS — I2510 Atherosclerotic heart disease of native coronary artery without angina pectoris: Secondary | ICD-10-CM | POA: Diagnosis not present

## 2017-06-16 DIAGNOSIS — D696 Thrombocytopenia, unspecified: Secondary | ICD-10-CM

## 2017-06-16 LAB — LIPID PANEL
Cholesterol: 161 mg/dL (ref 0–200)
HDL: 40.3 mg/dL (ref >39.00)
NonHDL: 121.04
Total CHOL/HDL Ratio: 4
Triglycerides: 262 mg/dL — ABNORMAL HIGH (ref 0.0–149.0)
VLDL: 52.4 mg/dL — ABNORMAL HIGH (ref 0.0–40.0)

## 2017-06-16 LAB — BASIC METABOLIC PANEL
BUN: 20 mg/dL (ref 6–23)
CO2: 28 mEq/L (ref 19–32)
Calcium: 9.1 mg/dL (ref 8.4–10.5)
Chloride: 103 mEq/L (ref 96–112)
Creatinine, Ser: 1 mg/dL (ref 0.40–1.50)
GFR: 77.98 mL/min (ref >60.00)
Glucose, Bld: 122 mg/dL — ABNORMAL HIGH (ref 70–99)
Potassium: 4.3 mEq/L (ref 3.5–5.1)
Sodium: 139 mEq/L (ref 135–145)

## 2017-06-16 LAB — CBC WITH DIFFERENTIAL/PLATELET
Basophils Absolute: 0 10*3/uL (ref 0.0–0.1)
Basophils Relative: 0.7 % (ref 0.0–3.0)
Eosinophils Absolute: 0.1 10*3/uL (ref 0.0–0.7)
Eosinophils Relative: 1.5 % (ref 0.0–5.0)
HCT: 41.1 % (ref 39.0–52.0)
Hemoglobin: 13.9 g/dL (ref 13.0–17.0)
Lymphocytes Relative: 29 % (ref 12.0–46.0)
Lymphs Abs: 1.8 10*3/uL (ref 0.7–4.0)
MCHC: 33.9 g/dL (ref 30.0–36.0)
MCV: 91.3 fl (ref 78.0–100.0)
Monocytes Absolute: 0.7 10*3/uL (ref 0.1–1.0)
Monocytes Relative: 10.7 % (ref 3.0–12.0)
Neutro Abs: 3.7 10*3/uL (ref 1.4–7.7)
Neutrophils Relative %: 58.1 % (ref 43.0–77.0)
Platelets: 129 10*3/uL — ABNORMAL LOW (ref 150.0–400.0)
RBC: 4.5 Mil/uL (ref 4.22–5.81)
RDW: 13.4 % (ref 11.5–15.5)
WBC: 6.4 10*3/uL (ref 4.0–10.5)

## 2017-06-16 LAB — HEPATIC FUNCTION PANEL
ALT: 20 U/L (ref 0–53)
AST: 25 U/L (ref 0–37)
Albumin: 4.4 g/dL (ref 3.5–5.2)
Alkaline Phosphatase: 60 U/L (ref 39–117)
Bilirubin, Direct: 0.1 mg/dL (ref 0.0–0.3)
Total Bilirubin: 0.4 mg/dL (ref 0.2–1.2)
Total Protein: 7.3 g/dL (ref 6.0–8.3)

## 2017-06-16 LAB — LDL CHOLESTEROL, DIRECT: Direct LDL: 89 mg/dL

## 2017-06-16 NOTE — Progress Notes (Signed)
Subjective:     Patient ID: Donald Pope, male   DOB: [birth year], 72 y.o.   MRN: [id number]  HPI Patient seen for physical exam. He had recent Medicare wellness visit. He is followed through the [place] [facility]. His chronic problems include history of CAD, obstructive sleep apnea, GERD, thrombocytopenia, hyperlipidemia. He has had prediabetes range blood sugars in the past. Never smoked. Health maintenance issues addressed. He has donated blood in the past 7 years and declines hepatitis C testing. Other health maintenance up-to-date.  Patient has family history of congestive heart failure mom age 71 and dad died suddenly at age 71 of unknown cause.  He has history of peripheral neuropathy and is on gabapentin and also low-dose Cymbalta. Symptoms are fairly well controlled still some night pain. Cymbalta is currently low-dose of 30 mg  Occasional slow stream with urination at night. He had PSA of 0.44 two years ago.  Past Medical History:  Diagnosis Date  . Allergy   . Benign prostatic hypertrophy    possible  . CAD, NATIVE VESSEL 10/23/2009  . DEPRESSION 09/13/2008  . False positive stress test    history of, suggestive of inferior ischemia  . GERD 09/13/2008  . HYPERLIPIDEMIA 09/13/2008  . OBSTRUCTIVE SLEEP APNEA 10/23/2009   cpap  . RBBB 10/26/2009   [place]  . Spinal stenosis of lumbar region 08/18/2012  . THROMBOCYTOPENIA 09/13/2008   Past Surgical History:  Procedure Laterality Date  . CARDIAC CATHETERIZATION     10 yrs ago  . COLONOSCOPY    . MAXIMUM ACCESS (MAS)POSTERIOR LUMBAR INTERBODY FUSION (PLIF) 1 LEVEL N/A 05/27/2013   Procedure: FOR MAXIMUM ACCESS (MAS) POSTERIOR LUMBAR INTERBODY FUSION (PLIF) 1 LEVEL ,LUMBAR FOUR-FIVE;  Surgeon: Eustace Dake, MD;  Location: [facility];  Service: Neurosurgery;  Laterality: N/A;  FOR MAXIMUM ACCESS (MAS) POSTERIOR LUMBAR INTERBODY FUSION (PLIF) 1 LEVEL ,LUMBAR FOUR-FIVE  . NASAL POLYP EXCISION    . TONSILLECTOMY      reports that  has  never smoked. he has never used smokeless tobacco. He reports that he drinks about 3.0 oz of alcohol per week. He reports that he does not use drugs. family history includes COPD in his father; Heart disease in his father; Heart disease (age of onset: 43) in his mother; Hyperlipidemia in his sister; Hypertension in his sister. Allergies  Allergen Reactions  . Adhesive [Tape] Rash  . Codeine Sulfate Other (See Comments)    urination problems  . Latex Rash     Review of Systems  Constitutional: Negative for activity change, appetite change, fatigue and fever.  HENT: Negative for congestion, ear pain and trouble swallowing.   Eyes: Negative for pain and visual disturbance.  Respiratory: Negative for cough, shortness of breath and wheezing.   Cardiovascular: Negative for chest pain and palpitations.  Gastrointestinal: Negative for abdominal distention, abdominal pain, blood in stool, constipation, diarrhea, nausea, rectal pain and vomiting.  Genitourinary: Negative for dysuria, hematuria and testicular pain.  Musculoskeletal: Negative for arthralgias and joint swelling.  Skin: Negative for rash.  Neurological: Negative for dizziness, syncope and headaches.  Hematological: Negative for adenopathy.  Psychiatric/Behavioral: Negative for confusion and dysphoric mood.       Objective:   Physical Exam  Constitutional: He is oriented to person, place, and time. He appears well-developed and well-nourished. No distress.  HENT:  Head: Normocephalic and atraumatic.  Right Ear: External ear normal.  Left Ear: External ear normal.  Mouth/Throat: Oropharynx is clear and moist.  Eyes: Conjunctivae  and EOM are normal. Pupils are equal, round, and reactive to light.  Neck: Normal range of motion. Neck supple. No thyromegaly present.  Cardiovascular: Normal rate, regular rhythm and normal heart sounds.  No murmur heard. Pulmonary/Chest: No respiratory distress. He has no wheezes. He has no rales.   Abdominal: Soft. Bowel sounds are normal. He exhibits no distension and no mass. There is no tenderness. There is no rebound and no guarding.  Musculoskeletal: He exhibits no edema.  Lymphadenopathy:    He has no cervical adenopathy.  Neurological: He is alert and oriented to person, place, and time. He displays normal reflexes. No cranial nerve deficit.  Skin: No rash noted.  Psychiatric: He has a normal mood and affect.       Assessment:     Physical exam. Patient has chronic problems as above which are stable. The following issues were addressed    Plan:     -Discussed hepatitis C testing and he declines. He has donated blood in the past 10 years and thus has been screened -Immunizations up-to-date. We did discuss new shingles vaccine and he will check on coverage. He's had previous shingles vaccine -Obtain labs with basic metabolic panel, lipid panel, hepatic panel, CBC -The natural history of prostate cancer and ongoing controversy regarding screening and potential treatment outcomes of prostate cancer has been discussed with the patient. The meaning of a false positive PSA and a false negative PSA has been discussed. He indicates understanding of the limitations of this screening test and wishes not to proceed with screening PSA testing. -Consider titration of Cymbalta to 60 mg if his neuropathy symptoms worsen  [name] MD [facility]

## 2017-06-19 DIAGNOSIS — G4733 Obstructive sleep apnea (adult) (pediatric): Secondary | ICD-10-CM | POA: Diagnosis not present

## 2017-06-26 ENCOUNTER — Other Ambulatory Visit: Payer: Self-pay | Admitting: Family Medicine

## 2017-08-06 DIAGNOSIS — M5431 Sciatica, right side: Secondary | ICD-10-CM | POA: Diagnosis not present

## 2017-08-06 DIAGNOSIS — M9914 Subluxation complex (vertebral) of sacral region: Secondary | ICD-10-CM | POA: Diagnosis not present

## 2017-08-06 DIAGNOSIS — M5417 Radiculopathy, lumbosacral region: Secondary | ICD-10-CM | POA: Diagnosis not present

## 2017-08-06 DIAGNOSIS — M9903 Segmental and somatic dysfunction of lumbar region: Secondary | ICD-10-CM | POA: Diagnosis not present

## 2017-08-12 DIAGNOSIS — M9914 Subluxation complex (vertebral) of sacral region: Secondary | ICD-10-CM | POA: Diagnosis not present

## 2017-08-12 DIAGNOSIS — M9903 Segmental and somatic dysfunction of lumbar region: Secondary | ICD-10-CM | POA: Diagnosis not present

## 2017-08-12 DIAGNOSIS — M5417 Radiculopathy, lumbosacral region: Secondary | ICD-10-CM | POA: Diagnosis not present

## 2017-08-12 DIAGNOSIS — M5431 Sciatica, right side: Secondary | ICD-10-CM | POA: Diagnosis not present

## 2017-08-14 DIAGNOSIS — M9914 Subluxation complex (vertebral) of sacral region: Secondary | ICD-10-CM | POA: Diagnosis not present

## 2017-08-14 DIAGNOSIS — M9903 Segmental and somatic dysfunction of lumbar region: Secondary | ICD-10-CM | POA: Diagnosis not present

## 2017-08-14 DIAGNOSIS — M5417 Radiculopathy, lumbosacral region: Secondary | ICD-10-CM | POA: Diagnosis not present

## 2017-08-14 DIAGNOSIS — M5431 Sciatica, right side: Secondary | ICD-10-CM | POA: Diagnosis not present

## 2017-08-20 DIAGNOSIS — M5431 Sciatica, right side: Secondary | ICD-10-CM | POA: Diagnosis not present

## 2017-08-20 DIAGNOSIS — M5417 Radiculopathy, lumbosacral region: Secondary | ICD-10-CM | POA: Diagnosis not present

## 2017-08-20 DIAGNOSIS — M9914 Subluxation complex (vertebral) of sacral region: Secondary | ICD-10-CM | POA: Diagnosis not present

## 2017-08-20 DIAGNOSIS — M9903 Segmental and somatic dysfunction of lumbar region: Secondary | ICD-10-CM | POA: Diagnosis not present

## 2017-08-22 ENCOUNTER — Ambulatory Visit (INDEPENDENT_AMBULATORY_CARE_PROVIDER_SITE_OTHER): Payer: PPO | Admitting: Family Medicine

## 2017-08-22 ENCOUNTER — Encounter: Payer: Self-pay | Admitting: Family Medicine

## 2017-08-22 VITALS — BP 130/68 | HR 88 | Temp 98.6°F | Ht 68.0 in | Wt 201.2 lb

## 2017-08-22 DIAGNOSIS — J019 Acute sinusitis, unspecified: Secondary | ICD-10-CM | POA: Diagnosis not present

## 2017-08-22 MED ORDER — AZITHROMYCIN 250 MG PO TABS: ORAL_TABLET | ORAL | 0 refills | Status: DC

## 2017-08-22 NOTE — Progress Notes (Signed)
   Subjective:    Patient ID: Donald Pope, male    DOB: [birth year], 72 y.o.   MRN: [id number]  HPI Here for 10 days of symptoms that have evolved. About 2 weeks ago he was visiting his 58 month old grandson who was being treated for a bronchiolitis caused by RSV. Then 10 days ago Donald Pope began to have a deep hard dry cough that lasted about a week. No fever. Then over the past 4 days the cough has improved while he has developed sinus pressure and has started to blow green mucus from the nose. Still no fever. On Mucinex.    Review of Systems  Constitutional: Negative.   HENT: Positive for congestion, postnasal drip, sinus pressure and sinus pain. Negative for sore throat.   Eyes: Negative.   Respiratory: Positive for cough.        Objective:   Physical Exam  Constitutional: He appears well-developed and well-nourished.  HENT:  Right Ear: External ear normal.  Left Ear: External ear normal.  Nose: Nose normal.  Mouth/Throat: Oropharynx is clear and moist.  Eyes: Conjunctivae are normal.  Neck: No thyromegaly present.  Pulmonary/Chest: Effort normal and breath sounds normal. No respiratory distress. He has no wheezes. He has no rales.  Lymphadenopathy:    He has no cervical adenopathy.          Assessment & Plan:  He has a sinusitis that likely began with a viral URI. Treat with a Zpack. Alysia Penna, MD

## 2017-08-25 DIAGNOSIS — M5431 Sciatica, right side: Secondary | ICD-10-CM | POA: Diagnosis not present

## 2017-08-25 DIAGNOSIS — M9914 Subluxation complex (vertebral) of sacral region: Secondary | ICD-10-CM | POA: Diagnosis not present

## 2017-08-25 DIAGNOSIS — M5417 Radiculopathy, lumbosacral region: Secondary | ICD-10-CM | POA: Diagnosis not present

## 2017-08-25 DIAGNOSIS — M9903 Segmental and somatic dysfunction of lumbar region: Secondary | ICD-10-CM | POA: Diagnosis not present

## 2017-09-01 DIAGNOSIS — M5431 Sciatica, right side: Secondary | ICD-10-CM | POA: Diagnosis not present

## 2017-09-01 DIAGNOSIS — M9903 Segmental and somatic dysfunction of lumbar region: Secondary | ICD-10-CM | POA: Diagnosis not present

## 2017-09-01 DIAGNOSIS — M9914 Subluxation complex (vertebral) of sacral region: Secondary | ICD-10-CM | POA: Diagnosis not present

## 2017-09-01 DIAGNOSIS — M5417 Radiculopathy, lumbosacral region: Secondary | ICD-10-CM | POA: Diagnosis not present

## 2017-09-04 DIAGNOSIS — M5431 Sciatica, right side: Secondary | ICD-10-CM | POA: Diagnosis not present

## 2017-09-04 DIAGNOSIS — M5417 Radiculopathy, lumbosacral region: Secondary | ICD-10-CM | POA: Diagnosis not present

## 2017-09-04 DIAGNOSIS — M9914 Subluxation complex (vertebral) of sacral region: Secondary | ICD-10-CM | POA: Diagnosis not present

## 2017-09-04 DIAGNOSIS — M9903 Segmental and somatic dysfunction of lumbar region: Secondary | ICD-10-CM | POA: Diagnosis not present

## 2017-09-28 ENCOUNTER — Other Ambulatory Visit: Payer: Self-pay | Admitting: Family Medicine

## 2017-10-05 DIAGNOSIS — H6120 Impacted cerumen, unspecified ear: Secondary | ICD-10-CM | POA: Diagnosis not present

## 2017-11-22 ENCOUNTER — Other Ambulatory Visit: Payer: Self-pay | Admitting: Family Medicine

## 2017-11-26 NOTE — Progress Notes (Signed)
Cardiology Office Note    Date:  11/27/2017   ID:  Donald Pope, DOB [birth year], MRN [id number]  PCP:  [name], MD  Cardiologist: Sinclair Grooms, MD   Chief Complaint  Patient presents with  . Coronary Artery Disease    History of Present Illness:  Donald Pope is a 72 y.o. male previously followed by Dr. Verl Blalock and Dr. Aundra Dubin with history of nonobstructive CAD, chronic bilateral lower extremity pain, hyperlipidemia, and obstructive sleep apnea.  Patient has no cardiac complaints.  He does have known nonobstructive coronary disease most recently demonstrated coronary angiography 2007.  He is physically active in both aerobic and resistance exercise.  No exercise associated complaints of excessive dyspnea or chest pain.  He has not had syncope.  He denies orthopnea and PND.  No lower extremity swelling.  He has cramping in lower extremities intermittently.  Was concerned that it might be related to statin therapy however a washout 6 weeks and not make any difference.  Past Medical History:  Diagnosis Date  . Allergy   . Benign prostatic hypertrophy    possible  . CAD, NATIVE VESSEL 10/23/2009  . DEPRESSION 09/13/2008  . False positive stress test    history of, suggestive of inferior ischemia  . GERD 09/13/2008  . HYPERLIPIDEMIA 09/13/2008  . OBSTRUCTIVE SLEEP APNEA 10/23/2009   cpap  . RBBB 10/26/2009   [place]  . Spinal stenosis of lumbar region 08/18/2012  . THROMBOCYTOPENIA 09/13/2008    Past Surgical History:  Procedure Laterality Date  . CARDIAC CATHETERIZATION     10 yrs ago  . COLONOSCOPY    . MAXIMUM ACCESS (MAS)POSTERIOR LUMBAR INTERBODY FUSION (PLIF) 1 LEVEL N/A 05/27/2013   Procedure: FOR MAXIMUM ACCESS (MAS) POSTERIOR LUMBAR INTERBODY FUSION (PLIF) 1 LEVEL ,LUMBAR FOUR-FIVE;  Surgeon: Eustace Lupo, MD;  Location: [facility];  Service: Neurosurgery;  Laterality: N/A;  FOR MAXIMUM ACCESS (MAS) POSTERIOR LUMBAR INTERBODY FUSION (PLIF) 1 LEVEL ,LUMBAR  FOUR-FIVE  . NASAL POLYP EXCISION    . TONSILLECTOMY      Current Medications: Outpatient Medications Prior to Visit  Medication Sig Dispense Refill  . aspirin 81 MG EC tablet Take 81 mg by mouth daily.      . cetirizine (ZYRTEC) 10 MG tablet Takes one tablet every other day.    . cholecalciferol (VITAMIN D) 1000 UNITS tablet Take 1,000 Units by mouth daily.     . DULoxetine (CYMBALTA) 30 MG capsule Take 30 mg by mouth at bedtime.    . fluticasone (FLONASE) 50 MCG/ACT nasal spray Place 2 sprays into the nose daily.      Marland Kitchen gabapentin (NEURONTIN) 300 MG capsule TAKE ONE CAPSULE BY MOUTH 4 TIMES A DAY 120 capsule 1  . Melatonin 3 MG TABS Take by mouth.    . Multiple Vitamin (MULTIVITAMIN) tablet Take 1 tablet by mouth daily.    Marland Kitchen omeprazole (PRILOSEC) 20 MG capsule Take 20 mg by mouth every other day.     . rosuvastatin (CRESTOR) 5 MG tablet Take 5 mg by mouth daily.    Marland Kitchen azithromycin (ZITHROMAX Z-PAK) 250 MG tablet As directed (Patient not taking: Reported on 11/27/2017) 6 each 0  . rosuvastatin (CRESTOR) 10 MG tablet Take 1 tablet (10 mg total) by mouth daily. (Patient not taking: Reported on 11/27/2017) 30 tablet 3   Facility-Administered Medications Prior to Visit  Medication Dose Route Frequency Provider Last Rate Last Dose  . 0.9 %  sodium chloride infusion  500 mL Intravenous Continuous Pyrtle, Lajuan Lines, MD         Allergies:   Adhesive [tape]; Codeine sulfate; and Latex   Social History   Socioeconomic History  . Marital status: Married    Spouse name: Not on file  . Number of children: Not on file  . Years of education: Not on file  . Highest education level: Not on file  Occupational History  . Not on file  Social Needs  . Financial resource strain: Not on file  . Food insecurity:    Worry: Not on file    Inability: Not on file  . Transportation needs:    Medical: Not on file    Non-medical: Not on file  Tobacco Use  . Smoking status: Never Smoker  . Smokeless  tobacco: Never Used  Substance and Sexual Activity  . Alcohol use: Yes    Alcohol/week: 3.0 oz    Types: 5 Glasses of wine per week    Comment: daily or 2 to 3 times a week (red week)   . Drug use: No  . Sexual activity: Not on file  Lifestyle  . Physical activity:    Days per week: Not on file    Minutes per session: Not on file  . Stress: Not on file  Relationships  . Social connections:    Talks on phone: Not on file    Gets together: Not on file    Attends religious service: Not on file    Active member of club or organization: Not on file    Attends meetings of clubs or organizations: Not on file    Relationship status: Not on file  Other Topics Concern  . Not on file  Social History Narrative  . Not on file     Family History:  The patient's family history includes COPD in his father; Heart disease in his father; Heart disease (age of onset: 50) in his mother; Hyperlipidemia in his sister; Hypertension in his sister.   ROS:   Please see the history of present illness.    Muscle pain, difficulty urinating, All other systems reviewed and are negative.   PHYSICAL EXAM:   VS:  BP 126/70   Pulse 89   Ht 5\' 8"  (1.727 m)   Wt 201 lb 3.2 oz (91.3 kg)   BMI 30.59 kg/m    GEN: Well nourished, well developed, in no acute distress  HEENT: normal  Neck: no JVD, carotid bruits, or masses Cardiac: RRR; no murmurs, rubs, or gallops,no edema  Respiratory:  clear to auscultation bilaterally, normal work of breathing GI: soft, nontender, nondistended, + BS MS: no deformity or atrophy  Skin: warm and dry, no rash Neuro:  Alert and Oriented x 3, Strength and sensation are intact Psych: euthymic mood, full affect  Wt Readings from Last 3 Encounters:  11/27/17 201 lb 3.2 oz (91.3 kg)  08/22/17 201 lb 3.2 oz (91.3 kg)  06/16/17 203 lb 12.8 oz (92.4 kg)      Studies/Labs Reviewed:   EKG:  EKG normal sinus rhythm, left axis deviation, right bundle branch block otherwise  unremarkable.  When compared to the prior tracing from June 13, 2014, no changes occurred.  Recent Labs: 06/16/2017: ALT 20; BUN 20; Creatinine, Ser 1.00; Hemoglobin 13.9; Platelets 129.0; Potassium 4.3; Sodium 139   Lipid Panel    Component Value Date/Time   CHOL 161 06/16/2017 0853   TRIG 262.0 (H) 06/16/2017 0853   HDL 40.30 06/16/2017 3154  CHOLHDL 4 06/16/2017 0853   VLDL 52.4 (H) 06/16/2017 0853   LDLCALC 56 08/15/2014 0932   LDLDIRECT 89.0 06/16/2017 0853    Additional studies/ records that were reviewed today include:  Cardiac catheterization 2007: FINDINGS:  1. LV:  119/4/9.  EF 65% without regional wall motion abnormality.  2. No aortic stenosis or mitral regurgitation.  3. Left main:  Angiographically normal.  4. LAD:  Moderate-sized vessel giving rise to two small diagonals.  The      proximal LAD has a 20% stenosis.  A septal has an 80% stenosis at its      ostium.  The mid LAD has a focal 30% stenosis.  5. Ramus intermedius:  Fairly small vessel which is angiographically      normal.  6. Circumflex:  Small vessel with 30% stenosis in the mid vessel.  7. RCA:  Large, dominant vessel.  There is a calcified 30% stenosis of the      mid vessel.  There is a focal 40% stenosis approximately 20 mm prior to      the origin of the PDA.  Lower extremity Doppler study 2016: No significant obstructive disease  ASSESSMENT:    1. Atherosclerosis of native coronary artery of native heart without angina pectoris   2. RBBB   3. PAD (peripheral artery disease) ([place])   4. Hyperlipidemia, unspecified hyperlipidemia type   5. THROMBOCYTOPENIA      PLAN:  In order of problems listed above:  1. Asymptomatic with reference to angina or anginal equivalent.  We will check a hemoglobin A1c.  Extensive conversation concerning secondary risk prevention to include 150 minutes of moderate aerobic activity per week, blood pressure 130/80 mmHg or less, hemoglobin A1c less than 7, LDL  cholesterol less than 70 (most recent was 84 in February.  We discussed low-fat diet and weight loss), and rate control.  No further evaluation at this time.  He should notify us of any cardiopulmonary change in exertional tolerance. 2. Bifascicular block on EKG is unchanged. 3. No evidence of PAD on exam. 4. Please see #1 above.   Clinical follow-up in 2 years or as needed symptoms.  Hemoglobin A1c is obtained today.    Medication Adjustments/Labs and Tests Ordered: Current medicines are reviewed at length with the patient today.  Concerns regarding medicines are outlined above.  Medication changes, Labs and Tests ordered today are listed in the Patient Instructions below. Patient Instructions  Medication Instructions:  Your physician recommends that you continue on your current medications as directed. Please refer to the Current Medication list given to you today.  Labwork: Hemoglobin A1C today  Testing/Procedures: None  Follow-Up: Your physician wants you to follow-up in: 2 years or as needed with Dr. Tamala Julian.  You will receive a reminder letter in the mail two months in advance. If you don't receive a letter, please call our office to schedule the follow-up appointment.   Any Other Special Instructions Will Be Listed Below (If Applicable).     If you need a refill on your cardiac medications before your next appointment, please call your pharmacy.      Signed, Sinclair Grooms, MD  11/27/2017 9:13 AM    [facility] [address], [place], [place]  [address] Phone: [phone]; Fax: [phone]

## 2017-11-27 ENCOUNTER — Encounter: Payer: Self-pay | Admitting: Interventional Cardiology

## 2017-11-27 ENCOUNTER — Ambulatory Visit: Payer: PPO | Admitting: Interventional Cardiology

## 2017-11-27 VITALS — BP 126/70 | HR 89 | Ht 68.0 in | Wt 201.2 lb

## 2017-11-27 DIAGNOSIS — I2510 Atherosclerotic heart disease of native coronary artery without angina pectoris: Secondary | ICD-10-CM

## 2017-11-27 DIAGNOSIS — I4510 Unspecified right bundle-branch block: Secondary | ICD-10-CM

## 2017-11-27 DIAGNOSIS — E785 Hyperlipidemia, unspecified: Secondary | ICD-10-CM

## 2017-11-27 DIAGNOSIS — I739 Peripheral vascular disease, unspecified: Secondary | ICD-10-CM

## 2017-11-27 DIAGNOSIS — D696 Thrombocytopenia, unspecified: Secondary | ICD-10-CM | POA: Diagnosis not present

## 2017-11-27 NOTE — Patient Instructions (Signed)
Medication Instructions:  Your physician recommends that you continue on your current medications as directed. Please refer to the Current Medication list given to you today.  Labwork: Hemoglobin A1C today  Testing/Procedures: None  Follow-Up: Your physician wants you to follow-up in: 2 years or as needed with Dr. Tamala Julian.  You will receive a reminder letter in the mail two months in advance. If you don't receive a letter, please call our office to schedule the follow-up appointment.   Any Other Special Instructions Will Be Listed Below (If Applicable).     If you need a refill on your cardiac medications before your next appointment, please call your pharmacy.

## 2017-11-28 LAB — HEMOGLOBIN A1C
Est. average glucose Bld gHb Est-mCnc: 120 mg/dL
Hgb A1c MFr Bld: 5.8 % — ABNORMAL HIGH (ref 4.8–5.6)

## 2018-01-12 DIAGNOSIS — Z955 Presence of coronary angioplasty implant and graft: Secondary | ICD-10-CM | POA: Diagnosis not present

## 2018-01-12 DIAGNOSIS — I10 Essential (primary) hypertension: Secondary | ICD-10-CM | POA: Diagnosis not present

## 2018-01-12 DIAGNOSIS — Z91040 Latex allergy status: Secondary | ICD-10-CM | POA: Diagnosis not present

## 2018-01-12 DIAGNOSIS — T675XXA Heat exhaustion, unspecified, initial encounter: Secondary | ICD-10-CM | POA: Diagnosis not present

## 2018-01-12 DIAGNOSIS — Z7982 Long term (current) use of aspirin: Secondary | ICD-10-CM | POA: Diagnosis not present

## 2018-01-12 DIAGNOSIS — R9431 Abnormal electrocardiogram [ECG] [EKG]: Secondary | ICD-10-CM | POA: Diagnosis not present

## 2018-01-12 DIAGNOSIS — R531 Weakness: Secondary | ICD-10-CM | POA: Diagnosis not present

## 2018-01-12 DIAGNOSIS — Z885 Allergy status to narcotic agent status: Secondary | ICD-10-CM | POA: Diagnosis not present

## 2018-01-16 ENCOUNTER — Encounter: Payer: Self-pay | Admitting: Family Medicine

## 2018-01-16 ENCOUNTER — Ambulatory Visit (INDEPENDENT_AMBULATORY_CARE_PROVIDER_SITE_OTHER): Payer: PPO | Admitting: Family Medicine

## 2018-01-16 VITALS — BP 110/80 | HR 76 | Temp 98.1°F | Wt 199.9 lb

## 2018-01-16 DIAGNOSIS — M48061 Spinal stenosis, lumbar region without neurogenic claudication: Secondary | ICD-10-CM | POA: Diagnosis not present

## 2018-01-16 DIAGNOSIS — F419 Anxiety disorder, unspecified: Secondary | ICD-10-CM | POA: Diagnosis not present

## 2018-01-16 DIAGNOSIS — R030 Elevated blood-pressure reading, without diagnosis of hypertension: Secondary | ICD-10-CM | POA: Diagnosis not present

## 2018-01-16 DIAGNOSIS — Z23 Encounter for immunization: Secondary | ICD-10-CM | POA: Diagnosis not present

## 2018-01-16 DIAGNOSIS — I4510 Unspecified right bundle-branch block: Secondary | ICD-10-CM | POA: Diagnosis not present

## 2018-01-16 DIAGNOSIS — N529 Male erectile dysfunction, unspecified: Secondary | ICD-10-CM

## 2018-01-16 MED ORDER — GABAPENTIN 300 MG PO CAPS: ORAL_CAPSULE | ORAL | 5 refills | Status: DC

## 2018-01-16 MED ORDER — CITALOPRAM HYDROBROMIDE 10 MG PO TABS: 10.0000 mg | ORAL_TABLET | Freq: Every day | ORAL | 3 refills | Status: DC

## 2018-01-16 MED ORDER — SILDENAFIL CITRATE 100 MG PO TABS: 50.0000 mg | ORAL_TABLET | Freq: Every day | ORAL | 11 refills | Status: DC | PRN

## 2018-01-16 NOTE — Progress Notes (Signed)
Subjective:     Patient ID: [name], male   DOB: 14-Jan-1946, 72 y.o.   MRN: [id number]  HPI  Patient here to discuss several items  He was down in [place] visiting family this past weekend. He did about 2 hours of heavy yard work and felt poorly afterwards with some dizziness and blood pressure was quite elevated with one reading as high as 180/100. He went to urgent care and had EKG which showed right bundle-branch block (which turns out is old) and he was referred to the ER.Blood pressure did eventually improve there and he was discharged home. Had no elevations of blood pressure since then.  No chest pain. No exertional dyspnea. Exercises regularly.  He has history of some anxiety type symptoms and has done well on Celexa and requesting refills. He takes gabapentin 300 mg 2 tablets twice daily for chronic back pain and requesting refills  Erectile dysfunction. Requesting trial of Viagra. No nitroglycerin use.  Past Medical History:  Diagnosis Date  . Allergy   . Benign prostatic hypertrophy    possible  . CAD, NATIVE VESSEL 10/23/2009  . DEPRESSION 09/13/2008  . False positive stress test    history of, suggestive of inferior ischemia  . GERD 09/13/2008  . HYPERLIPIDEMIA 09/13/2008  . OBSTRUCTIVE SLEEP APNEA 10/23/2009   cpap  . RBBB 10/26/2009   [place]  . Spinal stenosis of lumbar region 08/18/2012  . THROMBOCYTOPENIA 09/13/2008   Past Surgical History:  Procedure Laterality Date  . CARDIAC CATHETERIZATION     10 yrs ago  . COLONOSCOPY    . MAXIMUM ACCESS (MAS)POSTERIOR LUMBAR INTERBODY FUSION (PLIF) 1 LEVEL N/A 05/27/2013   Procedure: FOR MAXIMUM ACCESS (MAS) POSTERIOR LUMBAR INTERBODY FUSION (PLIF) 1 LEVEL ,LUMBAR FOUR-FIVE;  Surgeon: Eustace Sanagustin, MD;  Location: [place] [facility];  Service: Neurosurgery;  Laterality: N/A;  FOR MAXIMUM ACCESS (MAS) POSTERIOR LUMBAR INTERBODY FUSION (PLIF) 1 LEVEL ,LUMBAR FOUR-FIVE  . NASAL POLYP EXCISION    . TONSILLECTOMY      reports that he  has never smoked. He has never used smokeless tobacco. He reports that he drinks about 5.0 standard drinks of alcohol per week. He reports that he does not use drugs. family history includes COPD in his father; Heart disease in his father; Heart disease (age of onset: 17) in his mother; Hyperlipidemia in his sister; Hypertension in his sister. Allergies  Allergen Reactions  . Adhesive [Tape] Rash  . Codeine Sulfate Other (See Comments)    urination problems  . Latex Rash    Review of Systems  Constitutional: Negative for fatigue.  Eyes: Negative for visual disturbance.  Respiratory: Negative for cough, chest tightness and shortness of breath.   Cardiovascular: Negative for chest pain, palpitations and leg swelling.  Neurological: Negative for dizziness, syncope, weakness, light-headedness and headaches.       Objective:   Physical Exam  Constitutional: He is oriented to person, place, and time. He appears well-developed and well-nourished.  HENT:  Right Ear: External ear normal.  Left Ear: External ear normal.  Mouth/Throat: Oropharynx is clear and moist.  Eyes: Pupils are equal, round, and reactive to light.  Neck: Neck supple. No thyromegaly present.  Cardiovascular: Normal rate and regular rhythm.  Pulmonary/Chest: Effort normal and breath sounds normal. No respiratory distress. He has no wheezes. He has no rales.  Musculoskeletal: He exhibits no edema.  Neurological: He is alert and oriented to person, place, and time.       Assessment:     #  1 recent transient elevated blood pressure with normal reading today. Reassurance given.  #2 history of some chronic anxiety stable on Celexa  #3 chronic back pain treated with gabapentin  #4 erectile dysfunction    Plan:     -Viagra 100 mg one half to one tablet once daily as needed for erectile dysfunction -Refill Celexa and gabapentin -Flu vaccine given -monitor BP and be in touch if consistently > 140/90.   [name] MD  Primary Care at [facility]

## 2018-03-16 DIAGNOSIS — L308 Other specified dermatitis: Secondary | ICD-10-CM | POA: Diagnosis not present

## 2018-03-16 DIAGNOSIS — L821 Other seborrheic keratosis: Secondary | ICD-10-CM | POA: Diagnosis not present

## 2018-03-16 DIAGNOSIS — D1801 Hemangioma of skin and subcutaneous tissue: Secondary | ICD-10-CM | POA: Diagnosis not present

## 2018-03-16 DIAGNOSIS — L570 Actinic keratosis: Secondary | ICD-10-CM | POA: Diagnosis not present

## 2018-03-16 DIAGNOSIS — Z85828 Personal history of other malignant neoplasm of skin: Secondary | ICD-10-CM | POA: Diagnosis not present

## 2018-03-16 DIAGNOSIS — L812 Freckles: Secondary | ICD-10-CM | POA: Diagnosis not present

## 2018-03-16 DIAGNOSIS — L820 Inflamed seborrheic keratosis: Secondary | ICD-10-CM | POA: Diagnosis not present

## 2018-03-25 DIAGNOSIS — G4733 Obstructive sleep apnea (adult) (pediatric): Secondary | ICD-10-CM | POA: Diagnosis not present

## 2018-05-15 ENCOUNTER — Ambulatory Visit: Payer: PPO

## 2018-05-29 NOTE — Progress Notes (Addendum)
Subjective:   Donald Pope is a 73 y.o. male who presents for Medicare Annual/Subsequent preventive examination.  Review of Systems:  No ROS.  Medicare Wellness Visit. Additional risk factors are reflected in the social history.  Cardiac Risk Factors include: advanced age (>12men, >43 women);male gender;hypertension Sleep patterns: has interrupted sleep, feels rested on waking, does not get up to void, gets up 2 times nightly to void and sleeps 8 hours nightly.    Home Safety/Smoke Alarms: Feels safe in home. Smoke alarms in place.  Living environment; residence and Firearm Safety: 1-story house/ trailer. No use or need for DME.  Seat Belt Safety/Bike Helmet: Wears seat belt.    Male:   CCS- 06/2016; due 06/2019    PSA-  Lab Results  Component Value Date   PSA 0.44 03/02/2015   PSA 0.40 01/25/2014   PSA 0.86 12/28/2012       Objective:    Vitals: BP (!) 142/70 (BP Location: Right Arm, Patient Position: Sitting, Cuff Size: Normal)   Pulse 79   Temp 98.2 F (36.8 C)   Resp 16   Ht 5\' 8"  (1.727 m)   Wt 200 lb (90.7 kg)   SpO2 98%   BMI 30.41 kg/m   Body mass index is 30.41 kg/m.  Advanced Directives 06/01/2018 05/14/2017 06/10/2016 05/17/2013  Does Patient Have a Medical Advance Directive? Yes Yes Yes Patient has advance directive, copy in chart  Type of Advance Directive [place];Living will - [place];Living will -  Does patient want to make changes to medical advance directive? No - Patient declined - - -  Copy of [place] in Chart? No - copy requested - - -    Tobacco Social History   Tobacco Use  Smoking Status Never Smoker  Smokeless Tobacco Never Used     Counseling given: Not Answered  Past Medical History:  Diagnosis Date  . Allergy   . Benign prostatic hypertrophy    possible  . CAD, NATIVE VESSEL 10/23/2009  . DEPRESSION 09/13/2008  . False positive stress test    history of, suggestive of  inferior ischemia  . GERD 09/13/2008  . HYPERLIPIDEMIA 09/13/2008  . OBSTRUCTIVE SLEEP APNEA 10/23/2009   cpap  . RBBB 10/26/2009   [place]  . Spinal stenosis of lumbar region 08/18/2012  . THROMBOCYTOPENIA 09/13/2008   Past Surgical History:  Procedure Laterality Date  . CARDIAC CATHETERIZATION     10 yrs ago  . COLONOSCOPY    . MAXIMUM ACCESS (MAS)POSTERIOR LUMBAR INTERBODY FUSION (PLIF) 1 LEVEL N/A 05/27/2013   Procedure: FOR MAXIMUM ACCESS (MAS) POSTERIOR LUMBAR INTERBODY FUSION (PLIF) 1 LEVEL ,LUMBAR FOUR-FIVE;  Surgeon: Eustace Rhodes, MD;  Location: [facility];  Service: Neurosurgery;  Laterality: N/A;  FOR MAXIMUM ACCESS (MAS) POSTERIOR LUMBAR INTERBODY FUSION (PLIF) 1 LEVEL ,LUMBAR FOUR-FIVE  . NASAL POLYP EXCISION    . TONSILLECTOMY     Family History  Problem Relation Age of Onset  . Heart disease Mother 54       CHF  . Heart disease Father   . COPD Father   . Hypertension Sister   . Hyperlipidemia Sister   . Colon cancer Neg Hx   . Stomach cancer Neg Hx    Social History   Socioeconomic History  . Marital status: Married    Spouse name: Not on file  . Number of children: 1  . Years of education: Not on file  . Highest  education level: Not on file  Occupational History  . Not on file  Social Needs  . Financial resource strain: Not hard at all  . Food insecurity:    Worry: Never true    Inability: Never true  . Transportation needs:    Medical: No    Non-medical: No  Tobacco Use  . Smoking status: Never Smoker  . Smokeless tobacco: Never Used  Substance and Sexual Activity  . Alcohol use: Yes    Alcohol/week: 5.0 standard drinks    Types: 5 Glasses of wine per week    Comment: daily or 2 to 3 times a week (red week)   . Drug use: No  . Sexual activity: Not Currently  Lifestyle  . Physical activity:    Days per week: 5 days    Minutes per session: 30 min  . Stress: Not at all  Relationships  . Social connections:    Talks on phone: More than three  times a week    Gets together: Three times a week    Attends religious service: Never    Active member of club or organization: Yes    Attends meetings of clubs or organizations: More than 4 times per year    Relationship status: Married  Other Topics Concern  . Not on file  Social History Narrative   Lives with wife in [place] home. Son lives in [place], [place], 71mo grandson. Visits often.   Animal-lover, especially dogs.   Exercises routinely at [facility], enjoys yoga    Outpatient Encounter Medications as of 06/01/2018  Medication Sig  . aspirin 81 MG EC tablet Take 81 mg by mouth daily.    . cetirizine (ZYRTEC) 10 MG tablet Takes one tablet every other day.  . cholecalciferol (VITAMIN D) 1000 UNITS tablet Take 1,000 Units by mouth daily.   . citalopram (CELEXA) 10 MG tablet Take 1 tablet (10 mg total) by mouth daily.  . DULoxetine (CYMBALTA) 30 MG capsule Take 30 mg by mouth at bedtime.  . fluticasone (FLONASE) 50 MCG/ACT nasal spray Place 2 sprays into the nose daily.    Marland Kitchen gabapentin (NEURONTIN) 300 MG capsule Take two capsules twice daily (Patient taking differently: Take two capsules twice daily)  . Melatonin 3 MG TABS Take by mouth.  . Multiple Vitamin (MULTIVITAMIN) tablet Take 1 tablet by mouth daily.  Marland Kitchen omeprazole (PRILOSEC) 20 MG capsule Take 20 mg by mouth every other day.   . rosuvastatin (CRESTOR) 5 MG tablet Take 5 mg by mouth daily.  . [DISCONTINUED] sildenafil (VIAGRA) 100 MG tablet Take 0.5-1 tablets (50-100 mg total) by mouth daily as needed for erectile dysfunction. (Patient not taking: Reported on 06/01/2018)   Facility-Administered Encounter Medications as of 06/01/2018  Medication  . 0.9 %  sodium chloride infusion    Activities of Daily Living In your present state of health, do you have any difficulty performing the following activities: 06/01/2018  Hearing? N  Comment conversationallly ok, but pt. admits to some difficulty,  Vision? Y  Comment floaters  occassionally. eye dr. aware.  Difficulty concentrating or making decisions? N  Walking or climbing stairs? N  Dressing or bathing? N  Doing errands, shopping? N  Preparing Food and eating ? N  Using the Toilet? N  In the past six months, have you accidently leaked urine? N  Do you have problems with loss of bowel control? N  Managing your Medications? N  Managing your Finances? N  Housekeeping or managing your Housekeeping? N  Some recent data might be hidden    Patient Care Team: [name], MD as PCP - General Marica Otter, OD (Optometry)   Assessment:   This is a routine wellness examination for [place]. Physical assessment deferred to PCP.   Exercise Activities and Dietary recommendations Current Exercise Habits: Home exercise routine;Structured exercise class, Type of exercise: walking;treadmill;yoga, Time (Minutes): 30, Frequency (Times/Week): 7, Weekly Exercise (Minutes/Week): 210, Intensity: Moderate, Exercise limited by: neurologic condition(s)(neuropathic pain). Has calf pain, normally while lying horizontally at night. Will update PCP as pt. Requesting recommendations.   Diet (meal preparation, eat out, water intake, caffeinated beverages, dairy products, fruits and vegetables): in general, a "healthy" diet  , well balanced. Does eat out a lot, and discouraged about inability to lose 10+ pounds. Tips on eating out and reducing triglycerides provided.        Goals    . Patient Stated     Relieve calf pain, and try to lose 10-20 pounds (180lb).      . Weight (lb) < 190 lb (86.2 kg)     Check out  online nutrition programs as GumSearch.nl and http://vang.com/; fit9me; Look for foods with "whole" wheat; bran; oatmeal etc Shot at the farmer's markets in season for fresher choices  Watch for "hydrogenated" on the label of oils which are trans-fats.  Watch for "high fructose corn syrup" in snacks, yogurt or ketchup  Meats have less marbling; bright colored  fruits and vegetables;  Canned; dump out liquid and wash vegetables. Be mindful of what we are eating  Portion control is essential to a health weight! Sit down; take a break and enjoy your meal; take smaller bites; put the fork down between bites;  It takes 20 minutes to get full; so check in with your fullness cues and stop eating when you start to fill full              Fall Risk Fall Risk  06/01/2018 05/14/2017 03/29/2015  Falls in the past year? 0 No No    Depression Screen PHQ 2/9 Scores 06/01/2018 05/14/2017 03/29/2015  PHQ - 2 Score 0 0 0  PHQ- 9 Score 1 - -    Cognitive Function MMSE - Mini Mental State Exam 05/14/2017  Not completed: (No Data)        Immunization History  Administered Date(s) Administered  . Influenza Split 03/18/2012  . Influenza Whole 03/13/2010  . Influenza, High Dose Seasonal PF 04/05/2013, 03/08/2015, 03/13/2017, 01/16/2018  . Influenza,inj,Quad PF,6+ Mos 01/28/2014  . Pneumococcal Conjugate-13 01/28/2014  . Pneumococcal Polysaccharide-23 11/19/2010  . Td 09/20/2009  . Zoster 01/01/2013    Screening Tests Health Maintenance  Topic Date Due  . Hepatitis C Screening  07/19/2043 (Originally Jul 30, 1945)  . COLONOSCOPY  06/28/2019  . TETANUS/TDAP  09/21/2019  . INFLUENZA VACCINE  Completed  . PNA vac Low Risk Adult  Completed       Plan:    Continue to remain physically,mentally, and socially active!   To achieve your weight loss goal, and reduce triglycerides, consider drinking more water (to reduce caloric intake), and opt for lower- fat foods, especially while eating out (see handout provided).   I will follow up with Dr. Elease Hashimoto about your concern with muscle cramps, as well as your BP. Consider getting a BP cuff for home use, and check when not feeling well/seeing floaters/having any headaches. Let Dr. Gwyndolyn Kaufman what you find.  Bring a copy of your living will and/or healthcare power of attorney to your  next office visit.  Great  meeting you! Hope to see you next year!  I have personally reviewed and noted the following in the patient's chart:   . Medical and social history . Use of alcohol, tobacco or illicit drugs  . Current medications and supplements . Functional ability and status . Nutritional status . Physical activity . Advanced directives . List of other physicians . Vitals . Screenings to include cognitive, depression, and falls . Referrals and appointments  In addition, I have reviewed and discussed with patient certain preventive protocols, quality metrics, and best practice recommendations. A written personalized care plan for preventive services as well as general preventive health recommendations were provided to patient.     Alphia Moh, RN  06/01/2018  I have reviewed the documentation for the AWV and [place] provided by the health coach and agree with their documentation. I was immediately available for any questions  [name] MD [facility]

## 2018-06-01 ENCOUNTER — Ambulatory Visit (INDEPENDENT_AMBULATORY_CARE_PROVIDER_SITE_OTHER): Payer: PPO

## 2018-06-01 ENCOUNTER — Telehealth: Payer: Self-pay

## 2018-06-01 VITALS — BP 142/70 | HR 79 | Temp 98.2°F | Resp 16 | Ht 68.0 in | Wt 200.0 lb

## 2018-06-01 DIAGNOSIS — Z0000 Encounter for general adult medical examination without abnormal findings: Secondary | ICD-10-CM

## 2018-06-01 NOTE — Patient Instructions (Addendum)
Continue to remain physically,mentally, and socially active!   To achieve your weight loss goal, and reduce triglycerides, consider drinking more water (to reduce caloric intake), and opt for lower- fat foods, especially while eating out (see handout provided).   I will follow up with Dr. Elease Hashimoto about your concern with muscle cramps, as well as your BP. Consider getting a BP cuff for home use, and check when not feeling well/seeing floaters/having any headaches. Let Dr. Gwyndolyn Kaufman what you find.  Bring a copy of your living will and/or healthcare power of attorney to your next office visit.  Great meeting you! Hope to see you next year!  Donald Pope , Thank you for taking time to come for your Medicare Wellness Visit. I appreciate your ongoing commitment to your health goals. Please review the following plan we discussed and let me know if I can assist you in the future.   These are the goals we discussed: Goals    . Patient Stated     Relieve calf pain, and try to lose 10-20 pounds (180lb).      . Weight (lb) < 190 lb (86.2 kg)     Check out  online nutrition programs as GumSearch.nl and http://vang.com/; fit32me; Look for foods with "whole" wheat; bran; oatmeal etc Shot at the farmer's markets in season for fresher choices  Watch for "hydrogenated" on the label of oils which are trans-fats.  Watch for "high fructose corn syrup" in snacks, yogurt or ketchup  Meats have less marbling; bright colored fruits and vegetables;  Canned; dump out liquid and wash vegetables. Be mindful of what we are eating  Portion control is essential to a health weight! Sit down; take a break and enjoy your meal; take smaller bites; put the fork down between bites;  It takes 20 minutes to get full; so check in with your fullness cues and stop eating when you start to fill full              This is a list of the screening recommended for you and due dates:  Health Maintenance  Topic Date Due  .   Hepatitis C: One time screening is recommended by Center for Disease Control  (CDC) for  adults born from [birth year] through 1966.   07/19/2043*  . Colon Cancer Screening  06/28/2019  . Tetanus Vaccine  09/21/2019  . Flu Shot  Completed  . Pneumonia vaccines  Completed  *Topic was postponed. The date shown is not the original due date.     Health Maintenance, Male A healthy lifestyle and preventive care is important for your health and wellness. Ask your health care provider about what schedule of regular examinations is right for you. What should I know about weight and diet? Eat a Healthy Diet  Eat plenty of vegetables, fruits, whole grains, low-fat dairy products, and lean protein.  Do not eat a lot of foods high in solid fats, added sugars, or salt.  Maintain a Healthy Weight Regular exercise can help you achieve or maintain a healthy weight. You should:  Do at least 150 minutes of exercise each week. The exercise should increase your heart rate and make you sweat (moderate-intensity exercise).  Do strength-training exercises at least twice a week. Watch Your Levels of Cholesterol and Blood Lipids  Have your blood tested for lipids and cholesterol every 5 years starting at 74 years of age. If you are at high risk for heart disease, you should start having your blood tested when  you are 74 years old. You may need to have your cholesterol levels checked more often if: ? Your lipid or cholesterol levels are high. ? You are older than 74 years of age. ? You are at high risk for heart disease. What should I know about cancer screening? Many types of cancers can be detected early and may often be prevented. Lung Cancer  You should be screened every year for lung cancer if: ? You are a current smoker who has smoked for at least 30 years. ? You are a former smoker who has quit within the past 15 years.  Talk to your health care provider about your screening options, when you should start  screening, and how often you should be screened. Colorectal Cancer  Routine colorectal cancer screening usually begins at 74 years of age and should be repeated every 5-10 years until you are 74 years old. You may need to be screened more often if early forms of precancerous polyps or small growths are found. Your health care provider may recommend screening at an earlier age if you have risk factors for colon cancer.  Your health care provider may recommend using home test kits to check for hidden blood in the stool.  A small camera at the end of a tube can be used to examine your colon (sigmoidoscopy or colonoscopy). This checks for the earliest forms of colorectal cancer. Prostate and Testicular Cancer  Depending on your age and overall health, your health care provider may do certain tests to screen for prostate and testicular cancer.  Talk to your health care provider about any symptoms or concerns you have about testicular or prostate cancer. Skin Cancer  Check your skin from head to toe regularly.  Tell your health care provider about any new moles or changes in moles, especially if: ? There is a change in a mole's size, shape, or color. ? You have a mole that is larger than a pencil eraser.  Always use sunscreen. Apply sunscreen liberally and repeat throughout the day.  Protect yourself by wearing long sleeves, pants, a wide-brimmed hat, and sunglasses when outside. What should I know about heart disease, diabetes, and high blood pressure?  If you are 85-71 years of age, have your blood pressure checked every 3-5 years. If you are 65 years of age or older, have your blood pressure checked every year. You should have your blood pressure measured twice-once when you are at a hospital or clinic, and once when you are not at a hospital or clinic. Record the average of the two measurements. To check your blood pressure when you are not at a hospital or clinic, you can use: ? An  automated blood pressure machine at a pharmacy. ? A home blood pressure monitor.  Talk to your health care provider about your target blood pressure.  If you are between 30-11 years old, ask your health care provider if you should take aspirin to prevent heart disease.  Have regular diabetes screenings by checking your fasting blood sugar level. ? If you are at a normal weight and have a low risk for diabetes, have this test once every three years after the age of 63. ? If you are overweight and have a high risk for diabetes, consider being tested at a younger age or more often.  A one-time screening for abdominal aortic aneurysm (AAA) by ultrasound is recommended for men aged 72-75 years who are current or former smokers. What should I know about  preventing infection? Hepatitis B If you have a higher risk for hepatitis B, you should be screened for this virus. Talk with your health care provider to find out if you are at risk for hepatitis B infection. Hepatitis C Blood testing is recommended for:  Everyone born from [birth year] through 1966.  Anyone with known risk factors for hepatitis C. Sexually Transmitted Diseases (STDs)  You should be screened each year for STDs including gonorrhea and chlamydia if: ? You are sexually active and are younger than 74 years of age. ? You are older than 74 years of age and your health care provider tells you that you are at risk for this type of infection. ? Your sexual activity has changed since you were last screened and you are at an increased risk for chlamydia or gonorrhea. Ask your health care provider if you are at risk.  Talk with your health care provider about whether you are at high risk of being infected with HIV. Your health care provider may recommend a prescription medicine to help prevent HIV infection. What else can I do?  Schedule regular health, dental, and eye exams.  Stay current with your vaccines (immunizations).  Do not use any  tobacco products, such as cigarettes, chewing tobacco, and e-cigarettes. If you need help quitting, ask your health care provider.  Limit alcohol intake to no more than 2 drinks per day. One drink equals 12 ounces of beer, 5 ounces of wine, or 1 ounces of hard liquor.  Do not use street drugs.  Do not share needles.  Ask your health care provider for help if you need support or information about quitting drugs.  Tell your health care provider if you often feel depressed.  Tell your health care provider if you have ever been abused or do not feel safe at home. This information is not intended to replace advice given to you by your health care provider. Make sure you discuss any questions you have with your health care provider. Document Released: 10/26/2007 Document Revised: 12/27/2015 Document Reviewed: 01/31/2015 Elsevier Interactive Patient Education  2019 [employer].   Hearing Loss  Hearing loss is a partial or total loss of the ability to hear. This can be temporary or permanent, and it can happen in one or both ears. Hearing loss may be referred to as deafness. Medical care is necessary to treat hearing loss properly and to prevent the condition from getting worse. Your hearing may partially or completely come back, depending on what caused your hearing loss and how severe it is. In some cases, hearing loss is permanent. What are the causes? Common causes of hearing loss include:  Too much wax in the ear canal.  Infection of the ear canal or middle ear.  Fluid in the middle ear.  Injury to the ear or surrounding area.  An object stuck in the ear.  Prolonged exposure to loud sounds, such as music. Less common causes of hearing loss include:  Tumors in the ear.  Viral or bacterial infections, such as meningitis.  A hole in the eardrum (perforated eardrum).  Problems with the hearing nerve that sends signals between the brain and the ear.  Certain medicines. What  are the signs or symptoms? Symptoms of this condition may include:  Difficulty telling the difference between sounds.  Difficulty following a conversation when there is background noise.  Lack of response to sounds in your environment. This may be most noticeable when you do not respond to startling  sounds.  Needing to turn up the volume on the television, radio, etc.  Ringing in the ears.  Dizziness.  Pain in the ears. How is this diagnosed? This condition is diagnosed based on a physical exam and a hearing test (audiometry). The audiometry test will be performed by a hearing specialist (audiologist). You may also be referred to an ear, nose, and throat (ENT) specialist (otolaryngologist). How is this treated? Treatment for recent onset of hearing loss may include:  Ear wax removal.  Being prescribed medicines to prevent infection (antibiotics).  Being prescribed medicines to reduce inflammation (corticosteroids). Follow these instructions at home:  If you were prescribed an antibiotic medicine, take it as told by your health care provider. Do not stop taking the antibiotic even if you start to feel better.  Take over-the-counter and prescription medicines only as told by your health care provider.  Avoid loud noises.  Return to your normal activities as told by your health care provider. Ask your health care provider what activities are safe for you.  Keep all follow-up visits as told by your health care provider. This is important. Contact a health care provider if:  You feel dizzy.  You develop new symptoms.  You vomit or feel nauseous.  You have a fever. Get help right away if:  You develop sudden changes in your vision.  You have severe ear pain.  You have new or increased weakness.  You have a severe headache. This information is not intended to replace advice given to you by your health care provider. Make sure you discuss any questions you have with  your health care provider. Document Released: 04/29/2005 Document Revised: 10/05/2015 Document Reviewed: 09/14/2014 Elsevier Interactive Patient Education  2019 [employer].

## 2018-06-01 NOTE — Telephone Encounter (Signed)
During AWV, pt. stated his main concern lately has been muscle cramps in his calves that occur mostly at night while lying horizontal. Pain disrupts sleep. Pt. Stated he takes 1200mg  of gabapentin at night only, (not 600mg  bid as ordered) and continues to take his cymbalta as well. Pt. stated he has gone off the crestor in the past for 30days or more without any noticeable improvement in cramping, continues to take crestor 5mg  at this time. Requesting additional advice on pain relief.

## 2018-06-01 NOTE — Telephone Encounter (Signed)
Author phoned pt. to relay PCP's recommendation. Author left detailed VM, and advised to call back if any questions.

## 2018-06-01 NOTE — Telephone Encounter (Signed)
Has he tried B vitamin supplement- multi-complex B vitamin.  If not, would take that daily.

## 2018-07-09 ENCOUNTER — Ambulatory Visit (INDEPENDENT_AMBULATORY_CARE_PROVIDER_SITE_OTHER): Payer: PPO | Admitting: Family Medicine

## 2018-07-09 ENCOUNTER — Encounter: Payer: Self-pay | Admitting: Family Medicine

## 2018-07-09 VITALS — BP 122/82 | HR 81 | Temp 98.4°F | Ht 68.0 in | Wt 203.7 lb

## 2018-07-09 DIAGNOSIS — R21 Rash and other nonspecific skin eruption: Secondary | ICD-10-CM

## 2018-07-09 DIAGNOSIS — R109 Unspecified abdominal pain: Secondary | ICD-10-CM | POA: Diagnosis not present

## 2018-07-09 MED ORDER — VALACYCLOVIR HCL 1 G PO TABS: 1000.0000 mg | ORAL_TABLET | Freq: Three times a day (TID) | ORAL | 0 refills | Status: DC

## 2018-07-09 NOTE — Progress Notes (Signed)
HPI:  Using dictation device. Unfortunately this device frequently misinterprets words/phrases.  Acute visit for concern for shingles.  -Symptoms started 1 to 2 days ago -He noticed pain in the left lateral and posterior side of his lower rib cage area -Reports this pain feels similar to when he had shingles in the past, but is not severe, 4 out of 10 pain level -He thought it might have been from working out pretty heavy with a core and leg lifting workout a few days prior and the day before, but he is usually quite active at baseline -Here today for concern of shingles, he has not really noticed much of a rash -Denies fever, malaise, respiratory symptoms, cough, urinary symptoms, hematuria, history of kidney stones, severe pain -Does have a history of back issues  ROS: See pertinent positives and negatives per HPI.  Past Medical History:  Diagnosis Date  . Allergy   . Benign prostatic hypertrophy    possible  . CAD, NATIVE VESSEL 10/23/2009  . DEPRESSION 09/13/2008  . False positive stress test    history of, suggestive of inferior ischemia  . GERD 09/13/2008  . HYPERLIPIDEMIA 09/13/2008  . OBSTRUCTIVE SLEEP APNEA 10/23/2009   cpap  . RBBB 10/26/2009     . Spinal stenosis of lumbar region 08/18/2012  . THROMBOCYTOPENIA 09/13/2008    Past Surgical History:  Procedure Laterality Date  . CARDIAC CATHETERIZATION     10 yrs ago  . COLONOSCOPY    . MAXIMUM ACCESS (MAS)POSTERIOR LUMBAR INTERBODY FUSION (PLIF) 1 LEVEL N/A 05/27/2013   Procedure: FOR MAXIMUM ACCESS (MAS) POSTERIOR LUMBAR INTERBODY FUSION (PLIF) 1 LEVEL ,LUMBAR FOUR-FIVE;  Surgeon: Eustace Yadao, MD;  Location: [facility];  Service: Neurosurgery;  Laterality: N/A;  FOR MAXIMUM ACCESS (MAS) POSTERIOR LUMBAR INTERBODY FUSION (PLIF) 1 LEVEL ,LUMBAR FOUR-FIVE  . NASAL POLYP EXCISION    . TONSILLECTOMY      Family History  Problem Relation Age of Onset  . Heart disease Mother 81       CHF  . Heart disease Father   .  COPD Father   . Hypertension Sister   . Hyperlipidemia Sister   . Colon cancer Neg Hx   . Stomach cancer Neg Hx     SOCIAL HX: See HPI   Current Outpatient Medications:  .  aspirin 81 MG EC tablet, Take 81 mg by mouth daily.  , Disp: , Rfl:  .  cetirizine (ZYRTEC) 10 MG tablet, Takes one tablet every other day., Disp: , Rfl:  .  cholecalciferol (VITAMIN D) 1000 UNITS tablet, Take 1,000 Units by mouth daily. , Disp: , Rfl:  .  citalopram (CELEXA) 10 MG tablet, Take 1 tablet (10 mg total) by mouth daily., Disp: 90 tablet, Rfl: 3 .  DULoxetine (CYMBALTA) 30 MG capsule, Take 30 mg by mouth at bedtime., Disp: , Rfl:  .  fluticasone (FLONASE) 50 MCG/ACT nasal spray, Place 2 sprays into the nose daily.  , Disp: , Rfl:  .  gabapentin (NEURONTIN) 300 MG capsule, Take two capsules twice daily (Patient taking differently: Take two capsules twice daily), Disp: 120 capsule, Rfl: 5 .  Melatonin 3 MG TABS, Take by mouth., Disp: , Rfl:  .  Multiple Vitamin (MULTIVITAMIN) tablet, Take 1 tablet by mouth daily., Disp: , Rfl:  .  omeprazole (PRILOSEC) 20 MG capsule, Take 20 mg by mouth every other day. , Disp: , Rfl:  .  rosuvastatin (CRESTOR) 5 MG tablet, Take 5 mg by mouth daily., Disp: ,  Rfl:  .  valACYclovir (VALTREX) 1000 MG tablet, Take 1 tablet (1,000 mg total) by mouth 3 (three) times daily., Disp: 21 tablet, Rfl: 0  Current Facility-Administered Medications:  .  0.9 %  sodium chloride infusion, 500 mL, Intravenous, Continuous, Pyrtle, Lajuan Lines, MD  EXAM:  Vitals:   07/09/18 1535  BP: 122/82  Pulse: 81  Temp: 98.4 F (36.9 C)    Body mass index is 30.97 kg/m.  GENERAL: vitals reviewed and listed above, alert, oriented, appears well hydrated and in no acute distress  HEENT: atraumatic, conjunttiva clear, no obvious abnormalities on inspection of external nose and ears  NECK: no obvious masses on inspection  LUNGS: clear to auscultation bilaterally, no wheezes, rales or rhonchi, good air  movement  CV: HRRR, no peripheral edema  SKIN: numerous SKs, cherry angioma, does have a few small papules, ? vesicules  in area of concern L side  MS: moves all extremities without noticeable abnormality, middle TTP in superficial soft tissue L lat abd wall  ABD: no CVA TTP, abd soft, nttp  PSYCH: pleasant and cooperative, no obvious depression or anxiety  ASSESSMENT AND PLAN:  Discussed the following assessment and plan:  Side pain  Rash  -we discussed possible serious and likely etiologies, workup and treatment, treatment risks and return precautions; ? Shingles or musculoskeletal most likely, no alarm features  -after this discussion, Nellie opted for Valtrex after discussion risks/benefits in case shingles, symptomatic relief with OTC analgesics, follow up 2 weeks -of course, we advised Jurgen  to return or notify a doctor immediately if symptoms worsen or persist or new concerns arise.   Patient Instructions  Follow up 2 weeks.  Start the medication Valtrex and take as prescribed.  Aleve or tylenol if needed for pain per instructions and gentle stretching. No heave lifting or core work outs until feeling better.     Lucretia Kern, DO

## 2018-07-09 NOTE — Patient Instructions (Signed)
Follow up 2 weeks.  Start the medication Valtrex and take as prescribed.  Aleve or tylenol if needed for pain per instructions and gentle stretching. No heave lifting or core work outs until feeling better.

## 2018-07-25 ENCOUNTER — Other Ambulatory Visit: Payer: Self-pay | Admitting: Family Medicine

## 2018-07-30 DIAGNOSIS — G4733 Obstructive sleep apnea (adult) (pediatric): Secondary | ICD-10-CM | POA: Diagnosis not present

## 2018-09-10 ENCOUNTER — Other Ambulatory Visit: Payer: Self-pay

## 2018-09-10 ENCOUNTER — Ambulatory Visit (INDEPENDENT_AMBULATORY_CARE_PROVIDER_SITE_OTHER): Payer: PPO | Admitting: Family Medicine

## 2018-09-10 ENCOUNTER — Encounter: Payer: Self-pay | Admitting: Family Medicine

## 2018-09-10 VITALS — BP 120/68 | HR 97 | Temp 98.2°F | Ht 68.0 in | Wt 199.2 lb

## 2018-09-10 DIAGNOSIS — H6123 Impacted cerumen, bilateral: Secondary | ICD-10-CM

## 2018-09-10 NOTE — Patient Instructions (Signed)
Earwax Buildup, Adult  The ears produce a substance called earwax that helps keep bacteria out of the ear and protects the skin in the ear canal. Occasionally, earwax can build up in the ear and cause discomfort or hearing loss.  What increases the risk?  This condition is more likely to develop in people who:  · Are male.  · Are elderly.  · Naturally produce more earwax.  · Clean their ears often with cotton swabs.  · Use earplugs often.  · Use in-ear headphones often.  · Wear hearing aids.  · Have narrow ear canals.  · Have earwax that is overly thick or sticky.  · Have eczema.  · Are dehydrated.  · Have excess hair in the ear canal.  What are the signs or symptoms?  Symptoms of this condition include:  · Reduced or muffled hearing.  · A feeling of fullness in the ear or feeling that the ear is plugged.  · Fluid coming from the ear.  · Ear pain.  · Ear itch.  · Ringing in the ear.  · Coughing.  · An obvious piece of earwax that can be seen inside the ear canal.  How is this diagnosed?  This condition may be diagnosed based on:  · Your symptoms.  · Your medical history.  · An ear exam. During the exam, your health care provider will look into your ear with an instrument called an otoscope.  You may have tests, including a hearing test.  How is this treated?  This condition may be treated by:  · Using ear drops to soften the earwax.  · Having the earwax removed by a health care provider. The health care provider may:  ? Flush the ear with water.  ? Use an instrument that has a loop on the end (curette).  ? Use a suction device.  · Surgery to remove the wax buildup. This may be done in severe cases.  Follow these instructions at home:    · Take over-the-counter and prescription medicines only as told by your health care provider.  · Do not put any objects, including cotton swabs, into your ear. You can clean the opening of your ear canal with a washcloth or facial tissue.  · Follow instructions from your health care  provider about cleaning your ears. Do not over-clean your ears.  · Drink enough fluid to keep your urine clear or pale yellow. This will help to thin the earwax.  · Keep all follow-up visits as told by your health care provider. If earwax builds up in your ears often or if you use hearing aids, consider seeing your health care provider for routine, preventive ear cleanings. Ask your health care provider how often you should schedule your cleanings.  · If you have hearing aids, clean them according to instructions from the manufacturer and your health care provider.  Contact a health care provider if:  · You have ear pain.  · You develop a fever.  · You have blood, pus, or other fluid coming from your ear.  · You have hearing loss.  · You have ringing in your ears that does not go away.  · Your symptoms do not improve with treatment.  · You feel like the room is spinning (vertigo).  Summary  · Earwax can build up in the ear and cause discomfort or hearing loss.  · The most common symptoms of this condition include reduced or muffled hearing and a feeling of   fullness in the ear or feeling that the ear is plugged.  · This condition may be diagnosed based on your symptoms, your medical history, and an ear exam.  · This condition may be treated by using ear drops to soften the earwax or by having the earwax removed by a health care provider.  · Do not put any objects, including cotton swabs, into your ear. You can clean the opening of your ear canal with a washcloth or facial tissue.  This information is not intended to replace advice given to you by your health care provider. Make sure you discuss any questions you have with your health care provider.  Document Released: 06/06/2004 Document Revised: 04/10/2017 Document Reviewed: 07/10/2016  Elsevier Interactive Patient Education © 2019 [employer] Inc[employer]

## 2018-09-10 NOTE — Progress Notes (Signed)
  Subjective:     Patient ID: Donald Pope, male   DOB: 08/28/1945, 73 y.o.   MRN: [id number]  HPI Patient complains of right ear feeling "clogged up ".  He has had some decreased hearing when placing the phone to the ear.  Denies any tinnitus.  No vertigo.  No ear pain or drainage.  He has had wax buildup in the past and symptoms feel very similar.  Past Medical History:  Diagnosis Date  . Allergy   . Benign prostatic hypertrophy    possible  . CAD, NATIVE VESSEL 10/23/2009  . DEPRESSION 09/13/2008  . False positive stress test    history of, suggestive of inferior ischemia  . GERD 09/13/2008  . HYPERLIPIDEMIA 09/13/2008  . OBSTRUCTIVE SLEEP APNEA 10/23/2009   cpap  . RBBB 10/26/2009   [place]  . Spinal stenosis of lumbar region 08/18/2012  . THROMBOCYTOPENIA 09/13/2008   Past Surgical History:  Procedure Laterality Date  . CARDIAC CATHETERIZATION     10 yrs ago  . COLONOSCOPY    . MAXIMUM ACCESS (MAS)POSTERIOR LUMBAR INTERBODY FUSION (PLIF) 1 LEVEL N/A 05/27/2013   Procedure: FOR MAXIMUM ACCESS (MAS) POSTERIOR LUMBAR INTERBODY FUSION (PLIF) 1 LEVEL ,LUMBAR FOUR-FIVE;  Surgeon: Eustace Kerney, MD;  Location: [facility];  Service: Neurosurgery;  Laterality: N/A;  FOR MAXIMUM ACCESS (MAS) POSTERIOR LUMBAR INTERBODY FUSION (PLIF) 1 LEVEL ,LUMBAR FOUR-FIVE  . NASAL POLYP EXCISION    . TONSILLECTOMY      reports that he has never smoked. He has never used smokeless tobacco. He reports current alcohol use of about 5.0 standard drinks of alcohol per week. He reports that he does not use drugs. family history includes COPD in his father; Heart disease in his father; Heart disease (age of onset: 32) in his mother; Hyperlipidemia in his sister; Hypertension in his sister. Allergies  Allergen Reactions  . Adhesive [Tape] Rash  . Codeine Sulfate Other (See Comments)    urination problems  . Latex Rash     Review of Systems  Constitutional: Negative for chills and fever.  HENT: Negative for  congestion, ear discharge and ear pain.   Respiratory: Negative for cough and shortness of breath.        Objective:   Physical Exam Constitutional:      Appearance: Normal appearance.  HENT:     Ears:     Comments: Bilateral cerumen impactions Cardiovascular:     Rate and Rhythm: Normal rate and regular rhythm.  Neurological:     Mental Status: He is alert.        Assessment:     Bilateral cerumen impactions    Plan:     -Irrigation of both ears-cerumen removed without difficulty  [name] MD [facility]

## 2018-09-14 ENCOUNTER — Other Ambulatory Visit: Payer: Self-pay

## 2018-09-14 ENCOUNTER — Telehealth: Payer: Self-pay | Admitting: *Deleted

## 2018-09-14 DIAGNOSIS — H6123 Impacted cerumen, bilateral: Secondary | ICD-10-CM

## 2018-09-14 NOTE — Telephone Encounter (Signed)
Called patient and he stated that he had severe Vertigo on Thursday and Friday and is better today but his right ear is not good and he can hardly hear in his right ear. Patient is asking for an ENT referral because he states there is something wrong with his right ear.  OK for ENT referral?

## 2018-09-14 NOTE — Telephone Encounter (Signed)
Yes.  Go ahead with ENT referral. We know he no longer has a cerumen issue.

## 2018-09-14 NOTE — Telephone Encounter (Signed)
Referral has been sent and waiting to hear back from message to [facility].

## 2018-09-14 NOTE — Telephone Encounter (Signed)
Copied from [place] [id number]. Topic: General - Inquiry >> Sep 14, 2018  9:52 AM Richardo Priest, NT wrote: Reason for CRM: Patient is requesting a call back from the nurse of [facility]. States he would like to speak with her or get a reference ET doctor. Call back [phone].

## 2018-09-16 NOTE — Telephone Encounter (Signed)
Pt called In to request a call back from [place]. Advised pt of referral update, per CMA message from below.  Pt expressed understanding. Pt would like an update on status of referral?

## 2018-09-16 NOTE — Telephone Encounter (Signed)
Called patient and let him know the message from [facility]: pt refused an appt with Dr Dorinda Hill office for 09-17-2018@1 :45  he said he doesnt care for Dr Lucia Gaskins , I called [facility] they are experiencing phone problems and sad to call back in 2 hours  so it will be a delay in scheduling with [facility] because of  phone issues will have to call back in 2 hours   I advised to patient that I will call as soon as I find out about appointment. Patient verbalized an understanding.

## 2018-09-22 DIAGNOSIS — R42 Dizziness and giddiness: Secondary | ICD-10-CM | POA: Diagnosis not present

## 2018-09-22 DIAGNOSIS — H9121 Sudden idiopathic hearing loss, right ear: Secondary | ICD-10-CM | POA: Insufficient documentation

## 2018-09-22 DIAGNOSIS — H903 Sensorineural hearing loss, bilateral: Secondary | ICD-10-CM | POA: Diagnosis not present

## 2018-09-24 ENCOUNTER — Other Ambulatory Visit: Payer: Self-pay | Admitting: Otolaryngology

## 2018-09-24 DIAGNOSIS — R42 Dizziness and giddiness: Secondary | ICD-10-CM

## 2018-09-24 DIAGNOSIS — H9121 Sudden idiopathic hearing loss, right ear: Secondary | ICD-10-CM

## 2018-10-17 ENCOUNTER — Other Ambulatory Visit: Payer: Self-pay

## 2018-10-17 ENCOUNTER — Ambulatory Visit
Admission: RE | Admit: 2018-10-17 | Discharge: 2018-10-17 | Disposition: A | Discharge status: Home / Self Care | Payer: PPO | Source: Ambulatory Visit | Attending: Otolaryngology | Admitting: Otolaryngology

## 2018-10-17 DIAGNOSIS — R9082 White matter disease, unspecified: Secondary | ICD-10-CM | POA: Diagnosis not present

## 2018-10-17 DIAGNOSIS — R42 Dizziness and giddiness: Secondary | ICD-10-CM

## 2018-10-17 DIAGNOSIS — H9121 Sudden idiopathic hearing loss, right ear: Secondary | ICD-10-CM

## 2018-10-17 MED ORDER — GADOBENATE DIMEGLUMINE 529 MG/ML IV SOLN
18.0000 mL | Freq: Once | INTRAVENOUS | Status: AC | PRN
  Administered 2018-10-17: 18 mL via INTRAVENOUS

## 2018-10-19 ENCOUNTER — Encounter: Payer: Self-pay | Admitting: *Deleted

## 2018-10-20 ENCOUNTER — Other Ambulatory Visit: Payer: Self-pay

## 2018-10-20 ENCOUNTER — Ambulatory Visit (INDEPENDENT_AMBULATORY_CARE_PROVIDER_SITE_OTHER): Payer: PPO | Admitting: Family Medicine

## 2018-10-20 ENCOUNTER — Encounter: Payer: Self-pay | Admitting: Family Medicine

## 2018-10-20 VITALS — BP 130/62 | HR 96 | Temp 98.3°F | Wt 198.1 lb

## 2018-10-20 DIAGNOSIS — R39198 Other difficulties with micturition: Secondary | ICD-10-CM | POA: Diagnosis not present

## 2018-10-20 DIAGNOSIS — K921 Melena: Secondary | ICD-10-CM

## 2018-10-20 DIAGNOSIS — H9121 Sudden idiopathic hearing loss, right ear: Secondary | ICD-10-CM | POA: Diagnosis not present

## 2018-10-20 DIAGNOSIS — R42 Dizziness and giddiness: Secondary | ICD-10-CM | POA: Diagnosis not present

## 2018-10-20 MED ORDER — TAMSULOSIN HCL 0.4 MG PO CAPS: 0.4000 mg | ORAL_CAPSULE | Freq: Every day | ORAL | 3 refills | Status: DC

## 2018-10-20 NOTE — Progress Notes (Signed)
Subjective:     Patient ID: Donald Pope, male   DOB: November 27, 1945, 73 y.o.   MRN: [id number]  HPI Patient is seen for the following issues  Has recently had some increased latency with urination.  He states once his stream gets started usually is fairly strong-but slow to start.  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  Has not had seen a urologist in years.  Usually only gets up once or twice at night.  Denies any burning with urination.  No recent fevers or chills.  He is wondering if this is a urethral issue versus prostate issue  Patient relates episode last Tuesday of some bright red blood per rectum after straining with bowel movement.  He had had a second episode Wednesday but none since.  Blood has been bright and relatively small quantity.  He had colonoscopy 2018 with 3 benign polyps.  These were ascending polyps.  There was also mention of internal hemorrhoids and diverticulosis.  He does not have any diarrhea or frequent stools.  No appetite or weight changes.  Only mild discomfort once with bowel movement  Past Medical History:  Diagnosis Date  . Allergy   . Benign prostatic hypertrophy    possible  . CAD, NATIVE VESSEL 10/23/2009  . DEPRESSION 09/13/2008  . False positive stress test    history of, suggestive of inferior ischemia  . GERD 09/13/2008  . HYPERLIPIDEMIA 09/13/2008  . OBSTRUCTIVE SLEEP APNEA 10/23/2009   cpap  . RBBB 10/26/2009     . Spinal stenosis of lumbar region 08/18/2012  . THROMBOCYTOPENIA 09/13/2008   Past Surgical History:  Procedure Laterality Date  . CARDIAC CATHETERIZATION     10 yrs ago  . COLONOSCOPY    . MAXIMUM ACCESS (MAS)POSTERIOR LUMBAR INTERBODY FUSION (PLIF) 1 LEVEL N/A 05/27/2013   Procedure: FOR MAXIMUM ACCESS (MAS) POSTERIOR LUMBAR INTERBODY FUSION (PLIF) 1 LEVEL ,LUMBAR  FOUR-FIVE;  Surgeon: Eustace Mcleish, MD;  Location: [place];  Service: Neurosurgery;  Laterality: N/A;  FOR MAXIMUM ACCESS (MAS) POSTERIOR LUMBAR INTERBODY FUSION (PLIF) 1 LEVEL ,LUMBAR FOUR-FIVE  . NASAL POLYP EXCISION    . TONSILLECTOMY      reports that he has never smoked. He has never used smokeless tobacco. He reports current alcohol use of about 5.0 standard drinks of alcohol per week. He reports that he does not use drugs. family history includes COPD in his father; Heart disease in his father; Heart disease (age of onset: 31) in his mother; Hyperlipidemia in his sister; Hypertension in his sister. Allergies  Allergen Reactions  . Adhesive [Tape] Rash  . Codeine Sulfate Other (See Comments)    urination problems  . Latex Rash     Review of Systems  Constitutional: Negative for appetite change, chills, fever and unexpected weight change.  Respiratory: Negative for cough and shortness of breath.   Cardiovascular: Negative for chest pain.  Gastrointestinal: Negative for abdominal pain, anal bleeding, nausea and vomiting.  Genitourinary: Positive for difficulty urinating. Negative for discharge, flank pain, hematuria, penile pain, testicular pain and urgency.       Objective:   Physical Exam Constitutional:      Appearance: Normal appearance.  Cardiovascular:     Rate and Rhythm: Normal rate and regular rhythm.  Pulmonary:     Effort: Pulmonary effort is normal.  Breath sounds: Normal breath sounds.  Genitourinary:    Comments: He has some minimal external hemorrhoids which are not actively bleeding.  No thrombosis.  No obvious anal fissure.  Digital exam reveals a symmetric prostate with no palpated nodules.  No rectal masses.  Brown stool Neurological:     Mental Status: He is alert.        Assessment:     #1 episode of hematochezia last week.  Suspect probably internal hemorrhoids based on previous colonoscopy and history.  Doubt diverticulosis bleed-as only 2  recent episodes  #2 recent dysuria with slower stream.  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  Prostate does not seem particularly large on exam but could be part of the issue    Plan:     -Discussed measures to reduce constipation with plenty of fluids, fiber, exercise and stool softener if needed -Observe for now and will recommend consideration for GI referral if rectal bleeding persists.  He did have recent colonoscopy as above 2 years ago -Recommend trial of Flomax 0.4 mg nightly.  If he is not seeing improvement in the next couple weeks be in touch and would consider urology referral  [name] MD [facility]

## 2018-10-20 NOTE — Patient Instructions (Signed)

## 2018-11-17 ENCOUNTER — Other Ambulatory Visit: Payer: Self-pay | Admitting: Family Medicine

## 2018-12-11 ENCOUNTER — Telehealth: Payer: Self-pay

## 2018-12-11 NOTE — Telephone Encounter (Signed)
Called patient and he has a virtual appointment on Monday.

## 2018-12-11 NOTE — Telephone Encounter (Signed)
Copied from [place] [id number]. Topic: Appointment Scheduling - Scheduling Inquiry for Clinic >> Dec 11, 2018 11:51 AM Lennox Solders wrote: Reason for CRM:  Pt is requesting to see md to discuss getting a referral to urologist. Called office 3x

## 2018-12-14 ENCOUNTER — Other Ambulatory Visit: Payer: Self-pay

## 2018-12-14 ENCOUNTER — Ambulatory Visit (INDEPENDENT_AMBULATORY_CARE_PROVIDER_SITE_OTHER): Payer: PPO | Admitting: Family Medicine

## 2018-12-14 DIAGNOSIS — R39198 Other difficulties with micturition: Secondary | ICD-10-CM

## 2018-12-14 NOTE — Progress Notes (Signed)
Patient ID: Donald Pope, male   DOB: 03-29-1946, 73 y.o.   MRN: [id number]  This visit type was conducted due to national recommendations for restrictions regarding the COVID-19 pandemic in an effort to limit this patient's exposure and mitigate transmission in our community.   Virtual Visit via Video Note  I connected with Donald Pope on 12/14/18 at  9:30 AM EDT by a video enabled telemedicine application and verified that I am speaking with the correct person using two identifiers.  Location patient: home Location provider:work or home office Persons participating in the virtual visit: patient, provider  I discussed the limitations of evaluation and management by telemedicine and the availability of in person appointments. The patient expressed understanding and agreed to proceed.   HPI: Patient had called back stating that he was still having difficulty with urinating.  He has some increased latency with urination when he gets the urge and also very slow stream.  Refer to previous note.  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  He has not seen a urologist in several years.  We started Flomax 0.4 mg at night and he has not seen any improvement.  He has not had a PSA in a few years but previous PSAs were all very low.  No recent burning with urination.  No fevers or chills.  No further hematochezia since visit back in June.   ROS: See pertinent positives and negatives per HPI.  Past Medical History:  Diagnosis Date  . Allergy   . Benign prostatic hypertrophy    possible  . CAD, NATIVE VESSEL 10/23/2009  . DEPRESSION 09/13/2008  . False positive stress test    history of, suggestive of inferior ischemia  . GERD 09/13/2008  . HYPERLIPIDEMIA 09/13/2008  . OBSTRUCTIVE SLEEP APNEA 10/23/2009   cpap  . RBBB 10/26/2009   [place]  . Spinal stenosis of lumbar region 08/18/2012  . THROMBOCYTOPENIA 09/13/2008    Past Surgical History:   Procedure Laterality Date  . CARDIAC CATHETERIZATION     10 yrs ago  . COLONOSCOPY    . MAXIMUM ACCESS (MAS)POSTERIOR LUMBAR INTERBODY FUSION (PLIF) 1 LEVEL N/A 05/27/2013   Procedure: FOR MAXIMUM ACCESS (MAS) POSTERIOR LUMBAR INTERBODY FUSION (PLIF) 1 LEVEL ,LUMBAR FOUR-FIVE;  Surgeon: Eustace Chittenden, MD;  Location: [place] [facility];  Service: Neurosurgery;  Laterality: N/A;  FOR MAXIMUM ACCESS (MAS) POSTERIOR LUMBAR INTERBODY FUSION (PLIF) 1 LEVEL ,LUMBAR FOUR-FIVE  . NASAL POLYP EXCISION    . TONSILLECTOMY      Family History  Problem Relation Age of Onset  . Heart disease Mother 23       CHF  . Heart disease Father   . COPD Father   . Hypertension Sister   . Hyperlipidemia Sister   . Colon cancer Neg Hx   . Stomach cancer Neg Hx     SOCIAL HX: Non-smoker   Current Outpatient Medications:  .  aspirin 81 MG EC tablet, Take 81 mg by mouth daily.  , Disp: , Rfl:  .  cetirizine (ZYRTEC) 10 MG tablet, Takes one tablet every other day., Disp: , Rfl:  .  cholecalciferol (VITAMIN D) 1000 UNITS tablet, Take 1,000 Units by mouth daily. , Disp: , Rfl:  .  citalopram (CELEXA) 10 MG tablet, Take 1 tablet (10 mg total) by mouth daily., Disp: 90 tablet, Rfl: 3 .  DULoxetine (CYMBALTA) 30 MG capsule, Take 30 mg by mouth at bedtime., Disp: ,  Rfl:  .  fluticasone (FLONASE) 50 MCG/ACT nasal spray, Place 2 sprays into the nose daily.  , Disp: , Rfl:  .  gabapentin (NEURONTIN) 300 MG capsule, TAKE TWO CAPSULES BY MOUTH TWICE DAILY, Disp: 360 capsule, Rfl: 2 .  Melatonin 3 MG TABS, Take by mouth., Disp: , Rfl:  .  Multiple Vitamin (MULTIVITAMIN) tablet, Take 1 tablet by mouth daily., Disp: , Rfl:  .  omeprazole (PRILOSEC) 20 MG capsule, Take 20 mg by mouth every other day. , Disp: , Rfl:  .  rosuvastatin (CRESTOR) 5 MG tablet, Take 5 mg by mouth daily., Disp: , Rfl:  .  tamsulosin (FLOMAX) 0.4 MG CAPS capsule, Take 1 capsule (0.4 mg total) by mouth daily., Disp: 30 capsule, Rfl: 3 .  valACYclovir  (VALTREX) 1000 MG tablet, Take 1 tablet (1,000 mg total) by mouth 3 (three) times daily., Disp: 21 tablet, Rfl: 0  Current Facility-Administered Medications:  .  0.9 %  sodium chloride infusion, 500 mL, Intravenous, Continuous, Pyrtle, Lajuan Lines, MD  EXAM:  VITALS per patient if applicable:  GENERAL: alert, oriented, appears well and in no acute distress  HEENT: atraumatic, conjunttiva clear, no obvious abnormalities on inspection of external nose and ears  NECK: normal movements of the head and neck  LUNGS: on inspection no signs of respiratory distress, breathing rate appears normal, no obvious gross SOB, gasping or wheezing  CV: no obvious cyanosis  MS: moves all visible extremities without noticeable abnormality  PSYCH/NEURO: pleasant and cooperative, no obvious depression or anxiety, speech and thought processing grossly intact  ASSESSMENT AND PLAN:  Discussed the following assessment and plan:  Slow urinary stream - Plan: Ambulatory referral to Urology-unimproved with Flomax.  Past history of reported surgery involving urethra back in childhood.  Needs further urologic evaluation     I discussed the assessment and treatment plan with the patient. The patient was provided an opportunity to ask questions and all were answered. The patient agreed with the plan and demonstrated an understanding of the instructions.   The patient was advised to call back or seek an in-person evaluation if the symptoms worsen or if the condition fails to improve as anticipated.   Carolann Littler, MD

## 2019-01-04 ENCOUNTER — Encounter: Payer: Self-pay | Admitting: Family Medicine

## 2019-01-22 DIAGNOSIS — N401 Enlarged prostate with lower urinary tract symptoms: Secondary | ICD-10-CM | POA: Diagnosis not present

## 2019-01-22 DIAGNOSIS — R3911 Hesitancy of micturition: Secondary | ICD-10-CM | POA: Diagnosis not present

## 2019-01-22 DIAGNOSIS — R3914 Feeling of incomplete bladder emptying: Secondary | ICD-10-CM | POA: Diagnosis not present

## 2019-02-01 DIAGNOSIS — B354 Tinea corporis: Secondary | ICD-10-CM | POA: Diagnosis not present

## 2019-02-01 DIAGNOSIS — Z85828 Personal history of other malignant neoplasm of skin: Secondary | ICD-10-CM | POA: Diagnosis not present

## 2019-02-14 ENCOUNTER — Other Ambulatory Visit: Payer: Self-pay | Admitting: Family Medicine

## 2019-03-08 DIAGNOSIS — N401 Enlarged prostate with lower urinary tract symptoms: Secondary | ICD-10-CM | POA: Diagnosis not present

## 2019-03-08 DIAGNOSIS — R3911 Hesitancy of micturition: Secondary | ICD-10-CM | POA: Diagnosis not present

## 2019-03-22 DIAGNOSIS — R3911 Hesitancy of micturition: Secondary | ICD-10-CM | POA: Diagnosis not present

## 2019-03-22 DIAGNOSIS — R3914 Feeling of incomplete bladder emptying: Secondary | ICD-10-CM | POA: Diagnosis not present

## 2019-03-29 DIAGNOSIS — L820 Inflamed seborrheic keratosis: Secondary | ICD-10-CM | POA: Diagnosis not present

## 2019-03-29 DIAGNOSIS — Z85828 Personal history of other malignant neoplasm of skin: Secondary | ICD-10-CM | POA: Diagnosis not present

## 2019-03-29 DIAGNOSIS — D1801 Hemangioma of skin and subcutaneous tissue: Secondary | ICD-10-CM | POA: Diagnosis not present

## 2019-03-29 DIAGNOSIS — L812 Freckles: Secondary | ICD-10-CM | POA: Diagnosis not present

## 2019-03-29 DIAGNOSIS — L821 Other seborrheic keratosis: Secondary | ICD-10-CM | POA: Diagnosis not present

## 2019-03-29 DIAGNOSIS — L570 Actinic keratosis: Secondary | ICD-10-CM | POA: Diagnosis not present

## 2019-06-02 ENCOUNTER — Ambulatory Visit: Payer: PPO | Attending: Internal Medicine

## 2019-06-02 DIAGNOSIS — Z23 Encounter for immunization: Secondary | ICD-10-CM

## 2019-06-02 NOTE — Progress Notes (Signed)
    Covid-19 Vaccination Clinic  Name:  Donald Pope    MRN: [id number] DOB: March 17, 1946  06/02/2019  [name] was observed post Covid-19 immunization for 15 minutes without incidence. He was provided with Vaccine Information Sheet and instruction to access the V-Safe system.   [name] was instructed to call 911 with any severe reactions post vaccine: Marland Kitchen Difficulty breathing  . Swelling of your face and throat  . A fast heartbeat  . A bad rash all over your body  . Dizziness and weakness    Immunizations Administered    Name Date Dose VIS Date Route   Pfizer COVID-19 Vaccine 06/02/2019  6:27 PM 0.3 mL 04/23/2019 Intramuscular   Manufacturer: [place]   Lot: [id number]   [place]: [id number]

## 2019-06-07 ENCOUNTER — Other Ambulatory Visit: Payer: Self-pay

## 2019-06-07 ENCOUNTER — Encounter: Payer: Self-pay | Admitting: Internal Medicine

## 2019-06-07 ENCOUNTER — Ambulatory Visit (INDEPENDENT_AMBULATORY_CARE_PROVIDER_SITE_OTHER): Payer: PPO

## 2019-06-07 VITALS — BP 168/92 | Temp 97.9°F | Ht 68.0 in | Wt 201.4 lb

## 2019-06-07 DIAGNOSIS — Z1211 Encounter for screening for malignant neoplasm of colon: Secondary | ICD-10-CM | POA: Diagnosis not present

## 2019-06-07 DIAGNOSIS — Z0000 Encounter for general adult medical examination without abnormal findings: Secondary | ICD-10-CM | POA: Diagnosis not present

## 2019-06-07 NOTE — Progress Notes (Signed)
Subjective:   Donald Pope is a 74 y.o. male who presents for Medicare Annual/Subsequent preventive examination.  Donald Pope is doing a great job with physical activity. He is swimming, walking, doing yoga, and playing pickle ball. He expresses interest in losing 10 lbs and states that he is doing a good bit of snacking that he could eliminate. He continues to have chronic problems initiating his urine stream and reports he will flush toilet 4-5 times before he can urinate. He also reports chronic calf cramps during the night that are waking him from sleep every night. He denies cramps with exercising. He reports that his mother had problems with leg cramps of unknown cause as well.  Review of Systems:  NO ROS; Annual Medicare Wellness Visit      Objective:    Vitals: There were no vitals taken for this visit.  There is no height or weight on file to calculate BMI.  Advanced Directives 06/01/2018 05/14/2017 06/10/2016 05/17/2013  Does Patient Have a Medical Advance Directive? Yes Yes Yes Patient has advance directive, copy in chart  Type of Advance Directive [place];Living will - [place];Living will -  Does patient want to make changes to medical advance directive? No - Patient declined - - -  Copy of [place] in Chart? No - copy requested - - -    Tobacco Social History   Tobacco Use  Smoking Status Never Smoker  Smokeless Tobacco Never Used     Counseling given: Not Answered   Clinical Intake:                       Past Medical History:  Diagnosis Date  . Allergy   . Benign prostatic hypertrophy    possible  . CAD, NATIVE VESSEL 10/23/2009  . DEPRESSION 09/13/2008  . False positive stress test    history of, suggestive of inferior ischemia  . GERD 09/13/2008  . HYPERLIPIDEMIA 09/13/2008  . OBSTRUCTIVE SLEEP APNEA 10/23/2009   cpap  . RBBB 10/26/2009   [place]  . Spinal stenosis of lumbar region 08/18/2012   . THROMBOCYTOPENIA 09/13/2008   Past Surgical History:  Procedure Laterality Date  . CARDIAC CATHETERIZATION     10 yrs ago  . COLONOSCOPY    . MAXIMUM ACCESS (MAS)POSTERIOR LUMBAR INTERBODY FUSION (PLIF) 1 LEVEL N/A 05/27/2013   Procedure: FOR MAXIMUM ACCESS (MAS) POSTERIOR LUMBAR INTERBODY FUSION (PLIF) 1 LEVEL ,LUMBAR FOUR-FIVE;  Surgeon: Eustace Cecere, MD;  Location: [facility];  Service: Neurosurgery;  Laterality: N/A;  FOR MAXIMUM ACCESS (MAS) POSTERIOR LUMBAR INTERBODY FUSION (PLIF) 1 LEVEL ,LUMBAR FOUR-FIVE  . NASAL POLYP EXCISION    . TONSILLECTOMY     Family History  Problem Relation Age of Onset  . Heart disease Mother 25       CHF  . Heart disease Father   . COPD Father   . Hypertension Sister   . Hyperlipidemia Sister   . Colon cancer Neg Hx   . Stomach cancer Neg Hx    Social History   Socioeconomic History  . Marital status: Married    Spouse name: Not on file  . Number of children: 1  . Years of education: Not on file  . Highest education level: Not on file  Occupational History  . Not on file  Tobacco Use  . Smoking status: Never Smoker  . Smokeless tobacco: Never Used  Substance and Sexual Activity  .  Alcohol use: Yes    Alcohol/week: 5.0 standard drinks    Types: 5 Glasses of wine per week    Comment: daily or 2 to 3 times a week (red week)   . Drug use: No  . Sexual activity: Not Currently  Other Topics Concern  . Not on file  Social History Narrative   Lives with wife in [place] home. Son lives in [place], [place], 44mo grandson. Visits often.   Animal-lover, especially dogs.   Exercises routinely at [facility], enjoys yoga   Social Determinants of Health   Financial Resource Strain:   . Difficulty of Paying Living Expenses: Not on file  Food Insecurity:   . Worried About Charity fundraiser in the Last Year: Not on file  . Ran Out of Food in the Last Year: Not on file  Transportation Needs:   . Lack of Transportation (Medical): Not on file  .  Lack of Transportation (Non-Medical): Not on file  Physical Activity:   . Days of Exercise per Week: Not on file  . Minutes of Exercise per Session: Not on file  Stress:   . Feeling of Stress : Not on file  Social Connections:   . Frequency of Communication with Friends and Family: Not on file  . Frequency of Social Gatherings with Friends and Family: Not on file  . Attends Religious Services: Not on file  . Active Member of Clubs or Organizations: Not on file  . Attends Archivist Meetings: Not on file  . Marital Status: Not on file    Outpatient Encounter Medications as of 06/07/2019  Medication Sig  . aspirin 81 MG EC tablet Take 81 mg by mouth daily.    . cetirizine (ZYRTEC) 10 MG tablet Takes one tablet every other day.  . cholecalciferol (VITAMIN D) 1000 UNITS tablet Take 1,000 Units by mouth daily.   . citalopram (CELEXA) 10 MG tablet TAKE 1 TABLET BY MOUTH EVERY DAY  . DULoxetine (CYMBALTA) 30 MG capsule Take 30 mg by mouth at bedtime.  . fluticasone (FLONASE) 50 MCG/ACT nasal spray Place 2 sprays into the nose daily.    Marland Kitchen gabapentin (NEURONTIN) 300 MG capsule TAKE TWO CAPSULES BY MOUTH TWICE DAILY  . Melatonin 3 MG TABS Take by mouth.  . Multiple Vitamin (MULTIVITAMIN) tablet Take 1 tablet by mouth daily.  Marland Kitchen omeprazole (PRILOSEC) 20 MG capsule Take 20 mg by mouth every other day.   . rosuvastatin (CRESTOR) 5 MG tablet Take 5 mg by mouth daily.  . tamsulosin (FLOMAX) 0.4 MG CAPS capsule Take 1 capsule (0.4 mg total) by mouth daily.  . valACYclovir (VALTREX) 1000 MG tablet Take 1 tablet (1,000 mg total) by mouth 3 (three) times daily.   Facility-Administered Encounter Medications as of 06/07/2019  Medication  . 0.9 %  sodium chloride infusion    Activities of Daily Living No flowsheet data found.  Patient Care Team: [name], MD as PCP - General Marica Otter, OD (Optometry)   Assessment:   This is a routine wellness examination for  Donald Pope.  Exercise Activities and Dietary recommendations    Goals    . Patient Stated     Relieve calf pain, and try to lose 10-20 pounds (180lb).      . Weight (lb) < 190 lb (86.2 kg)     Check out  online nutrition programs as GumSearch.nl and http://vang.com/; fit56me; Look for foods with "whole" wheat; bran; oatmeal etc Shot at the [employer] in season  for fresher choices  Watch for "hydrogenated" on the label of oils which are trans-fats.  Watch for "high fructose corn syrup" in snacks, yogurt or ketchup  Meats have less marbling; bright colored fruits and vegetables;  Canned; dump out liquid and wash vegetables. Be mindful of what we are eating  Portion control is essential to a health weight! Sit down; take a break and enjoy your meal; take smaller bites; put the fork down between bites;  It takes 20 minutes to get full; so check in with your fullness cues and stop eating when you start to fill full              Fall Risk Fall Risk  06/01/2018 05/14/2017 03/29/2015  Falls in the past year? 0 No No   Is the patient's home free of loose throw rugs in walkways, pet beds, electrical cords, etc?   yes      Grab bars in the bathroom? yes      Handrails on the stairs?   yes      Adequate lighting?   yes  Timed Get Up and Go Performed: Normal  Depression Screen PHQ 2/9 Scores 06/01/2018 05/14/2017 03/29/2015  PHQ - 2 Score 0 0 0  PHQ- 9 Score 1 - -    Cognitive Function MMSE - Mini Mental State Exam 05/14/2017  Not completed: (No Data)        Immunization History  Administered Date(s) Administered  . Influenza Split 03/18/2012  . Influenza Whole 03/13/2010  . Influenza, High Dose Seasonal PF 04/05/2013, 03/08/2015, 03/13/2017, 01/16/2018, 03/04/2019  . Influenza,inj,Quad PF,6+ Mos 01/28/2014  . PFIZER SARS-COV-2 Vaccination 06/02/2019  . Pneumococcal Conjugate-13 01/28/2014  . Pneumococcal Polysaccharide-23 11/19/2010  . Td 09/20/2009  . Zoster  01/01/2013  . Zoster Recombinat (Shingrix) 11/03/2018, 01/09/2019    Qualifies for Shingles Vaccine? Patient has already completed  Screening Tests Health Maintenance  Topic Date Due  . Hepatitis C Screening  07/19/2043 (Originally [birth year])  . COLONOSCOPY  06/28/2019  . TETANUS/TDAP  09/21/2019  . INFLUENZA VACCINE  Completed  . PNA vac Low Risk Adult  Completed   Cancer Screenings: Lung: Low Dose CT Chest recommended if Age 72-80 years, 30 pack-year currently smoking OR have quit w/in 15years. Patient does not qualify. Colorectal: Yes  Additional Screenings:  Hepatitis C Screening: defer to next routine lab collection in office.       Plan:   Donald Pope's BP was elevated in the office today 168/92 and he was instructed to check daily at home and keep a log. Notify our office if BP consistently is >140/90 and bring log to that appointment. Hep C screening needs to be collected at next routine lab draw. Colonoscopy is due after 06/28/2019 as well. Referral was made for this today since patient has not heard from that office yet.  I have personally reviewed and noted the following in the patient's chart:   . Medical and social history . Use of alcohol, tobacco or illicit drugs  . Current medications and supplements . Functional ability and status . Nutritional status . Physical activity . Advanced directives . List of other physicians . Hospitalizations, surgeries, and ER visits in previous 12 months . Vitals . Screenings to include cognitive, depression, and falls . Referrals and appointments  In addition, I have reviewed and discussed with patient certain preventive protocols, quality metrics, and best practice recommendations. A written personalized care plan for preventive services as well as general preventive health recommendations were provided  to patient.     Franne Forts, LPN  [id number]

## 2019-06-07 NOTE — Patient Instructions (Addendum)
Donald Pope , Thank you for taking time to come for your Medicare Wellness Visit. I appreciate your ongoing commitment to your health goals. Please review the following plan we discussed and let me know if I can assist you in the future.   Screening recommendations/referrals: Colorectal Screening: colonoscopy performed 06/27/2016; due 06/29/2019.   Vision and Dental Exams: Recommended annual ophthalmology exams for early detection of glaucoma and other disorders of the eye Recommended annual dental exams for proper oral hygiene  Diabetic Exams: Diabetic Eye Exam: N/A Diabetic Foot Exam: N/A  Vaccinations: Influenza vaccine: completed 03/04/2019; due again Fall 2021. Pneumococcal vaccine: completed 11/19/2010 & 01/28/2014. Up to date.  Tdap vaccine: completed 09/20/2009; due again 09/21/2019. Medicare does not cover cost UNLESS you have an accident or injury. This can be given to you in our office.  Shingles vaccine: completed 11/03/2018 & 01/09/2019.   Advanced directives: Advance directives discussed with you today.  Please bring a copy of your POA (Power of [place]) and/or Living Will to your next appointment.  Goals: Recommend to drink at least 6-8 8oz glasses of water per day.  Continue to exercise for at least 150 minutes per week. You are doing a fabulous job with this!  Recommend to decrease portion sizes by eating 3 small healthy meals and at least 2 healthy snacks per day.  START TAKING BLOOD PRESSURE ONCE DAILY AT DIFFERENT TIMES AND KEEP RECORD. IF YOU NOTICE IT CONSISTENTLY RUNNING >140/90 PLEASE SCHEDULE APPOINTMENT WITH DR Elease Hashimoto AND BRING THOSE BP READINGS TO THAT APPOINTMENT.   Next appointment: Please schedule your Annual Wellness Visit with your Nurse Health Advisor in one year.  Preventive Care 23 Years and Older, Male Preventive care refers to lifestyle choices and visits with your health care provider that can promote health and wellness. What does preventive  care include?  A yearly physical exam. This is also called an annual well check.  Dental exams once or twice a year.  Routine eye exams. Ask your health care provider how often you should have your eyes checked.  Personal lifestyle choices, including:  Daily care of your teeth and gums.  Regular physical activity.  Eating a healthy diet.  Avoiding tobacco and drug use.  Limiting alcohol use.  Practicing safe sex.  Taking low doses of aspirin every day if recommended by your health care provider..  Taking vitamin and mineral supplements as recommended by your health care provider. What happens during an annual well check? The services and screenings done by your health care provider during your annual well check will depend on your age, overall health, lifestyle risk factors, and family history of disease. Counseling  Your health care provider may ask you questions about your:  Alcohol use.  Tobacco use.  Drug use.  Emotional well-being.  Home and relationship well-being.  Sexual activity.  Eating habits.  History of falls.  Memory and ability to understand (cognition).  Work and work Statistician. Screening  You may have the following tests or measurements:  Height, weight, and BMI.  Blood pressure.  Lipid and cholesterol levels. These may be checked every 5 years, or more frequently if you are over 53 years old.  Skin check.  Lung cancer screening. You may have this screening every year starting at age 39 if you have a 30-pack-year history of smoking and currently smoke or have quit within the past 15 years.  Fecal occult blood test (FOBT) of the stool. You may have this test every year starting at  age 67.  Flexible sigmoidoscopy or colonoscopy. You may have a sigmoidoscopy every 5 years or a colonoscopy every 10 years starting at age 13.  Prostate cancer screening. Recommendations will vary depending on your family history and other  risks.  Hepatitis C blood test.  Hepatitis B blood test.  Sexually transmitted disease (STD) testing.  Diabetes screening. This is done by checking your blood sugar (glucose) after you have not eaten for a while (fasting). You may have this done every 1-3 years.  Abdominal aortic aneurysm (AAA) screening. You may need this if you are a current or former smoker.  Osteoporosis. You may be screened starting at age 78 if you are at high risk. Talk with your health care provider about your test results, treatment options, and if necessary, the need for more tests. Vaccines  Your health care provider may recommend certain vaccines, such as:  Influenza vaccine. This is recommended every year.  Tetanus, diphtheria, and acellular pertussis (Tdap, Td) vaccine. You may need a Td booster every 10 years.  Zoster vaccine. You may need this after age 71.  Pneumococcal 13-valent conjugate (PCV13) vaccine. One dose is recommended after age 82.  Pneumococcal polysaccharide (PPSV23) vaccine. One dose is recommended after age 63. Talk to your health care provider about which screenings and vaccines you need and how often you need them. This information is not intended to replace advice given to you by your health care provider. Make sure you discuss any questions you have with your health care provider. Document Released: 05/26/2015 Document Revised: 01/17/2016 Document Reviewed: 02/28/2015 Elsevier Interactive Patient Education  2017 [place] Prevention in the Home Falls can cause injuries. They can happen to people of all ages. There are many things you can do to make your home safe and to help prevent falls. What can I do on the outside of my home?  Regularly fix the edges of walkways and driveways and fix any cracks.  Remove anything that might make you trip as you walk through a door, such as a raised step or threshold.  Trim any bushes or trees on the path to your home.  Use  bright outdoor lighting.  Clear any walking paths of anything that might make someone trip, such as rocks or tools.  Regularly check to see if handrails are loose or broken. Make sure that both sides of any steps have handrails.  Any raised decks and porches should have guardrails on the edges.  Have any leaves, snow, or ice cleared regularly.  Use sand or salt on walking paths during winter.  Clean up any spills in your garage right away. This includes oil or grease spills. What can I do in the bathroom?  Use night lights.  Install grab bars by the toilet and in the tub and shower. Do not use towel bars as grab bars.  Use non-skid mats or decals in the tub or shower.  If you need to sit down in the shower, use a plastic, non-slip stool.  Keep the floor dry. Clean up any water that spills on the floor as soon as it happens.  Remove soap buildup in the tub or shower regularly.  Attach bath mats securely with double-sided non-slip rug tape.  Do not have throw rugs and other things on the floor that can make you trip. What can I do in the bedroom?  Use night lights.  Make sure that you have a light by your bed that is easy to  reach.  Do not use any sheets or blankets that are too big for your bed. They should not hang down onto the floor.  Have a firm chair that has side arms. You can use this for support while you get dressed.  Do not have throw rugs and other things on the floor that can make you trip. What can I do in the kitchen?  Clean up any spills right away.  Avoid walking on wet floors.  Keep items that you use a lot in easy-to-reach places.  If you need to reach something above you, use a strong step stool that has a grab bar.  Keep electrical cords out of the way.  Do not use floor polish or wax that makes floors slippery. If you must use wax, use non-skid floor wax.  Do not have throw rugs and other things on the floor that can make you trip. What can  I do with my stairs?  Do not leave any items on the stairs.  Make sure that there are handrails on both sides of the stairs and use them. Fix handrails that are broken or loose. Make sure that handrails are as long as the stairways.  Check any carpeting to make sure that it is firmly attached to the stairs. Fix any carpet that is loose or worn.  Avoid having throw rugs at the top or bottom of the stairs. If you do have throw rugs, attach them to the floor with carpet tape.  Make sure that you have a light switch at the top of the stairs and the bottom of the stairs. If you do not have them, ask someone to add them for you. What else can I do to help prevent falls?  Wear shoes that:  Do not have high heels.  Have rubber bottoms.  Are comfortable and fit you well.  Are closed at the toe. Do not wear sandals.  If you use a stepladder:  Make sure that it is fully opened. Do not climb a closed stepladder.  Make sure that both sides of the stepladder are locked into place.  Ask someone to hold it for you, if possible.  Clearly mark and make sure that you can see:  Any grab bars or handrails.  First and last steps.  Where the edge of each step is.  Use tools that help you move around (mobility aids) if they are needed. These include:  Canes.  Walkers.  Scooters.  Crutches.  Turn on the lights when you go into a dark area. Replace any light bulbs as soon as they burn out.  Set up your furniture so you have a clear path. Avoid moving your furniture around.  If any of your floors are uneven, fix them.  If there are any pets around you, be aware of where they are.  Review your medicines with your doctor. Some medicines can make you feel dizzy. This can increase your chance of falling. Ask your doctor what other things that you can do to help prevent falls. This information is not intended to replace advice given to you by your health care provider. Make sure you  discuss any questions you have with your health care provider. Document Released: 02/23/2009 Document Revised: 10/05/2015 Document Reviewed: 06/03/2014 Elsevier Interactive Patient Education  2017 [employer].

## 2019-06-23 ENCOUNTER — Ambulatory Visit: Payer: PPO | Attending: Internal Medicine

## 2019-06-23 DIAGNOSIS — Z23 Encounter for immunization: Secondary | ICD-10-CM

## 2019-06-23 NOTE — Progress Notes (Signed)
   Covid-19 Vaccination Clinic  Name:  Donald Pope    MRN: [id number] DOB: 10-17-1945  06/23/2019  Mr. Bouch was observed post Covid-19 immunization for 15 minutes without incidence. He was provided with Vaccine Information Sheet and instruction to access the V-Safe system.   Mr. Mueth was instructed to call 911 with any severe reactions post vaccine: Marland Kitchen Difficulty breathing  . Swelling of your face and throat  . A fast heartbeat  . A bad rash all over your body  . Dizziness and weakness    Immunizations Administered    Name Date Dose VIS Date Route   Pfizer COVID-19 Vaccine 06/23/2019  8:51 AM 0.3 mL 04/23/2019 Intramuscular   Manufacturer: [place]   Lot: [id number]   [place]: [id number]

## 2019-06-28 ENCOUNTER — Other Ambulatory Visit: Payer: Self-pay

## 2019-06-28 ENCOUNTER — Ambulatory Visit (AMBULATORY_SURGERY_CENTER): Payer: Self-pay

## 2019-06-28 VITALS — Temp 97.1°F | Ht 68.0 in | Wt 201.0 lb

## 2019-06-28 DIAGNOSIS — Z86010 Personal history of colonic polyps: Secondary | ICD-10-CM

## 2019-06-28 MED ORDER — NA SULFATE-K SULFATE-MG SULF 17.5-3.13-1.6 GM/177ML PO SOLN: 1.0000 | Freq: Once | ORAL | 0 refills | Status: AC

## 2019-06-28 NOTE — Progress Notes (Signed)

## 2019-06-29 ENCOUNTER — Encounter: Payer: Self-pay | Admitting: Internal Medicine

## 2019-07-02 ENCOUNTER — Telehealth: Payer: Self-pay | Admitting: Family Medicine

## 2019-07-02 ENCOUNTER — Ambulatory Visit: Payer: PPO

## 2019-07-02 NOTE — Progress Notes (Signed)
  Chronic Care Management   Outreach Note  07/02/2019 Name: CAMP KREGER MRN: [id number] DOB: 1945-08-29  Referred by: [name], MD Reason for referral : No chief complaint on file.   An unsuccessful telephone outreach was attempted today. The patient was referred to the pharmacist for assistance with care management and care coordination.   Follow Up Plan:   Raynicia Dukes [employer] Scheduler

## 2019-07-08 ENCOUNTER — Telehealth: Payer: Self-pay | Admitting: Family Medicine

## 2019-07-08 NOTE — Progress Notes (Signed)
  Chronic Care Management   Note  07/08/2019 Name: Donald Pope MRN: [id number] DOB: [birth year]  Donald Pope is a 74 y.o. year old male who is a primary care patient of Burchette, Alinda Sierras, MD. I reached out to Donald Pope by phone today in response to a referral sent by Mr. Cristopher Estimable Kashuba's PCP, [name], MD.   [name] was given information about Chronic Care Management services today including:  1. CCM service includes personalized support from designated clinical staff supervised by his physician, including individualized plan of care and coordination with other care providers 2. 24/7 contact phone numbers for assistance for urgent and routine care needs. 3. Service will only be billed when office clinical staff spend 20 minutes or more in a month to coordinate care. 4. Only one practitioner may furnish and bill the service in a calendar month. 5. The patient may stop CCM services at any time (effective at the end of the month) by phone call to the office staff. 6. The patient will be responsible for cost sharing (co-pay) of up to 20% of the service fee (after annual deductible is met).  Patient agreed to services and verbal consent obtained.   Follow up plan:   Raynicia Dukes [employer] Scheduler

## 2019-07-12 ENCOUNTER — Encounter: Payer: Self-pay | Admitting: Internal Medicine

## 2019-07-12 ENCOUNTER — Ambulatory Visit (AMBULATORY_SURGERY_CENTER): Payer: PPO | Admitting: Internal Medicine

## 2019-07-12 ENCOUNTER — Other Ambulatory Visit: Payer: Self-pay

## 2019-07-12 VITALS — BP 142/79 | HR 69 | Temp 95.9°F | Resp 15 | Ht 68.0 in | Wt 201.0 lb

## 2019-07-12 DIAGNOSIS — D124 Benign neoplasm of descending colon: Secondary | ICD-10-CM

## 2019-07-12 DIAGNOSIS — D120 Benign neoplasm of cecum: Secondary | ICD-10-CM

## 2019-07-12 DIAGNOSIS — Z86010 Personal history of colonic polyps: Secondary | ICD-10-CM

## 2019-07-12 DIAGNOSIS — D123 Benign neoplasm of transverse colon: Secondary | ICD-10-CM

## 2019-07-12 MED ORDER — SODIUM CHLORIDE 0.9 % IV SOLN: 500.0000 mL | Freq: Once | INTRAVENOUS | Status: DC

## 2019-07-12 NOTE — Progress Notes (Signed)
Temp-JB VS-KA  Pt's states no medical or surgical changes since previsit or office visit.

## 2019-07-12 NOTE — Patient Instructions (Signed)
HANDOUTS PROVIDED ON: POLYPS, DIVERTICULOSIS, & HEMORRHOIDS  The polyps removed today have been sent for pathology.  The results can take 1-3 weeks to receive.  When your next colonoscopy should occur will be based on the pathology results.    You may resume your previous diet and medication schedule.  Thank you for allowing [place] to care for you today!!!   YOU HAD AN ENDOSCOPIC PROCEDURE TODAY AT [facility]:   Refer to the procedure report that was given to you for any specific questions about what was found during the examination.  If the procedure report does not answer your questions, please call your gastroenterologist to clarify.  If you requested that your care partner not be given the details of your procedure findings, then the procedure report has been included in a sealed envelope for you to review at your convenience later.  YOU SHOULD EXPECT: Some feelings of bloating in the abdomen. Passage of more gas than usual.  Walking can help get rid of the air that was put into your GI tract during the procedure and reduce the bloating. If you had a lower endoscopy (such as a colonoscopy or flexible sigmoidoscopy) you may notice spotting of blood in your stool or on the toilet paper. If you underwent a bowel prep for your procedure, you may not have a normal bowel movement for a few days.  Please Note:  You might notice some irritation and congestion in your nose or some drainage.  This is from the oxygen used during your procedure.  There is no need for concern and it should clear up in a day or so.  SYMPTOMS TO REPORT IMMEDIATELY:   Following lower endoscopy (colonoscopy or flexible sigmoidoscopy):  Excessive amounts of blood in the stool  Significant tenderness or worsening of abdominal pains  Swelling of the abdomen that is new, acute  Fever of 100F or higher  For urgent or emergent issues, a gastroenterologist can be reached at any hour by calling [phone]  [phone].   DIET:  We do recommend a small meal at first, but then you may proceed to your regular diet.  Drink plenty of fluids but you should avoid alcoholic beverages for 24 hours.  ACTIVITY:  You should plan to take it easy for the rest of today and you should NOT DRIVE or use heavy machinery until tomorrow (because of the sedation medicines used during the test).    FOLLOW UP: Our staff will call the number listed on your records 48-72 hours following your procedure to check on you and address any questions or concerns that you may have regarding the information given to you following your procedure. If we do not reach you, we will leave a message.  We will attempt to reach you two times.  During this call, we will ask if you have developed any symptoms of COVID 19. If you develop any symptoms (ie: fever, flu-like symptoms, shortness of breath, cough etc.) before then, please call [phone].  If you test positive for Covid 19 in the 2 weeks post procedure, please call and report this information to [place].    If any biopsies were taken you will be contacted by phone or by letter within the next 1-3 weeks.  Please call us at [phone] if you have not heard about the biopsies in 3 weeks.    SIGNATURES/CONFIDENTIALITY: You and/or your care partner have signed paperwork which will be entered into your electronic medical record.  These  signatures attest to the fact that that the information above on your After Visit Summary has been reviewed and is understood.  Full responsibility of the confidentiality of this discharge information lies with you and/or your care-partner. 

## 2019-07-12 NOTE — Op Note (Signed)
[facility] Patient Name: Donald Pope Procedure Date: 07/12/2019 10:26 AM MRN: [id number] Endoscopist: Jerene Bears , MD Age: 74 Referring MD:  Date of Birth: [birth year] Gender: Male Account #: [id number] Procedure:                Colonoscopy Indications:              High risk colon cancer surveillance: Personal                            history of non-advanced adenoma, Personal history                            of sessile serrated colon polyp (10 mm or greater                            in size), Last colonoscopy 3 years ago Medicines:                Monitored Anesthesia Care Procedure:                Pre-Anesthesia Assessment:                           - Prior to the procedure, a History and Physical                            was performed, and patient medications and                            allergies were reviewed. The patient's tolerance of                            previous anesthesia was also reviewed. The risks                            and benefits of the procedure and the sedation                            options and risks were discussed with the patient.                            All questions were answered, and informed consent                            was obtained. Prior Anticoagulants: The patient has                            taken no previous anticoagulant or antiplatelet                            agents. ASA Grade Assessment: II - A patient with                            mild systemic disease. After reviewing the risks  and benefits, the patient was deemed in                            satisfactory condition to undergo the procedure.                           After obtaining informed consent, the colonoscope                            was passed under direct vision. Throughout the                            procedure, the patient's blood pressure, pulse, and                            oxygen saturations were  monitored continuously. The                            Colonoscope was introduced through the anus and                            advanced to the cecum, identified by appendiceal                            orifice and ileocecal valve. The colonoscopy was                            performed without difficulty. The patient tolerated                            the procedure well. The quality of the bowel                            preparation was good. The ileocecal valve,                            appendiceal orifice, and rectum were photographed. Scope In: 10:35:39 AM Scope Out: 10:57:32 AM Scope Withdrawal Time: 0 hours 12 minutes 52 seconds  Total Procedure Duration: 0 hours 21 minutes 53 seconds  Findings:                 The digital rectal exam was normal.                           A 8 mm polyp was found in the cecum. The polyp was                            flat. The polyp was removed with a cold snare.                            Resection and retrieval were complete.                           A 3 mm polyp was found in  the ascending colon. The                            polyp was sessile. The polyp was removed with a                            cold snare. Resection and retrieval were complete.                           A 5 mm polyp was found in the descending colon. The                            polyp was sessile. The polyp was removed with a                            cold snare. Resection and retrieval were complete.                           Multiple medium-mouthed diverticula were found in                            the sigmoid colon and descending colon.                           Internal hemorrhoids were found during                            retroflexion. The hemorrhoids were medium-sized. Complications:            No immediate complications. Estimated Blood Loss:     Estimated blood loss was minimal. Impression:               - One 8 mm polyp in the cecum, removed with a  cold                            snare. Resected and retrieved.                           - One 3 mm polyp in the ascending colon, removed                            with a cold snare. Resected and retrieved.                           - One 5 mm polyp in the descending colon, removed                            with a cold snare. Resected and retrieved.                           - Diverticulosis in the sigmoid colon and in the                            descending colon.                           -  Internal hemorrhoids. Recommendation:           - Patient has a contact number available for                            emergencies. The signs and symptoms of potential                            delayed complications were discussed with the                            patient. Return to normal activities tomorrow.                            Written discharge instructions were provided to the                            patient.                           - Resume previous diet.                           - Continue present medications.                           - Await pathology results.                           - Repeat colonoscopy is recommended for                            surveillance. The colonoscopy date will be                            determined after pathology results from today's                            exam become available for review. Jerene Bears, MD 07/12/2019 11:00:51 AM This report has been signed electronically.

## 2019-07-12 NOTE — Progress Notes (Signed)
Called to room to assist during endoscopic procedure.  Patient ID and intended procedure confirmed with present staff. Received instructions for my participation in the procedure from the performing physician.  

## 2019-07-14 ENCOUNTER — Telehealth: Payer: Self-pay | Admitting: *Deleted

## 2019-07-14 NOTE — Telephone Encounter (Signed)
No answer for post procedure call back. Left message and will call patient back this afternoon.

## 2019-07-14 NOTE — Telephone Encounter (Signed)
  Follow up Call-  Call back number 07/12/2019  Post procedure Call Back phone  # [phone]  Permission to leave phone message Yes  Some recent data might be hidden     Patient questions:  Do you have a fever, pain , or abdominal swelling? No. Pain Score  0 *  Have you tolerated food without any problems? Yes.    Have you been able to return to your normal activities? Yes.    Do you have any questions about your discharge instructions: Diet   No. Medications  No. Follow up visit  No.  Do you have questions or concerns about your Care? No.  Actions: * If pain score is 4 or above: No action needed, pain <4.  1. Have you developed a fever since your procedure? no  2.   Have you had an respiratory symptoms (SOB or cough) since your procedure? no  3.   Have you tested positive for COVID 19 since your procedure no  4.   Have you had any family members/close contacts diagnosed with the COVID 19 since your procedure?  no   If yes to any of these questions please route to Joylene John, RN and Alphonsa Gin, Therapist, sports.

## 2019-07-15 ENCOUNTER — Encounter: Payer: Self-pay | Admitting: Internal Medicine

## 2019-07-22 ENCOUNTER — Telehealth: Payer: PPO

## 2019-07-26 DIAGNOSIS — R3914 Feeling of incomplete bladder emptying: Secondary | ICD-10-CM | POA: Diagnosis not present

## 2019-07-26 DIAGNOSIS — R3911 Hesitancy of micturition: Secondary | ICD-10-CM | POA: Diagnosis not present

## 2019-08-22 ENCOUNTER — Other Ambulatory Visit: Payer: Self-pay | Admitting: Family Medicine

## 2019-08-23 ENCOUNTER — Other Ambulatory Visit: Payer: Self-pay | Admitting: Family Medicine

## 2019-09-13 ENCOUNTER — Telehealth: Payer: Self-pay | Admitting: Family Medicine

## 2019-09-13 NOTE — Telephone Encounter (Signed)
Patient is wanting to come in the office for bad cold and sore throat. I advised the patient that we are not seeing patients in the office with those symptoms that we are only doing virtual and he don't see how Dr. Elease Hashimoto can see his throat through the phone so he decided not to schedule.

## 2019-09-16 DIAGNOSIS — Z20822 Contact with and (suspected) exposure to covid-19: Secondary | ICD-10-CM | POA: Diagnosis not present

## 2019-11-09 ENCOUNTER — Telehealth (INDEPENDENT_AMBULATORY_CARE_PROVIDER_SITE_OTHER): Payer: PPO | Admitting: Family Medicine

## 2019-11-09 DIAGNOSIS — L989 Disorder of the skin and subcutaneous tissue, unspecified: Secondary | ICD-10-CM

## 2019-11-09 DIAGNOSIS — W57XXXA Bitten or stung by nonvenomous insect and other nonvenomous arthropods, initial encounter: Secondary | ICD-10-CM | POA: Diagnosis not present

## 2019-11-09 NOTE — Progress Notes (Signed)
Virtual Visit via Video Note  I connected with Donald Pope  on 11/09/19 at  3:00 PM EDT by a video enabled telemedicine application and verified that I am speaking with the correct person using two identifiers.  Location patient: home, [place] Location provider:work or home office Persons participating in the virtual visit: patient, provider, wife  I discussed the limitations of evaluation and management by telemedicine and the availability of in person appointments. The patient expressed understanding and agreed to proceed.   HPI:  Acute visit for a skin lesion: -was hiking in the mountains in [place], [place] 3 days ago -when got home the next day and was taking a shower noticed a tick in his beard - was not attached, but he noticed a small red mark on his face in the area that could have been a tick bite -was a very small black tick, they killed and disposed of it -denies skin rashes, swelling or symptoms at the site, malaise, fevers, chills, body aches or any other symptoms  ROS: See pertinent positives and negatives per HPI.  Past Medical History:  Diagnosis Date  . Allergy   . Benign prostatic hypertrophy    possible  . CAD, NATIVE VESSEL 10/23/2009  . DEPRESSION 09/13/2008  . False positive stress test    history of, suggestive of inferior ischemia  . GERD 09/13/2008  . HYPERLIPIDEMIA 09/13/2008  . OBSTRUCTIVE SLEEP APNEA 10/23/2009   cpap  . RBBB 10/26/2009   [facility]  . Sleep apnea    Cpap  . Spinal stenosis of lumbar region 08/18/2012  . THROMBOCYTOPENIA 09/13/2008    Past Surgical History:  Procedure Laterality Date  . CARDIAC CATHETERIZATION     10 yrs ago  . COLONOSCOPY  06/27/2016  . MAXIMUM ACCESS (MAS)POSTERIOR LUMBAR INTERBODY FUSION (PLIF) 1 LEVEL N/A 05/27/2013   Procedure: FOR MAXIMUM ACCESS (MAS) POSTERIOR LUMBAR INTERBODY FUSION (PLIF) 1 LEVEL ,LUMBAR FOUR-FIVE;  Surgeon: Eustace Gainer, MD;  Location: [facility];  Service: Neurosurgery;  Laterality: N/A;  FOR MAXIMUM ACCESS (MAS)  POSTERIOR LUMBAR INTERBODY FUSION (PLIF) 1 LEVEL ,LUMBAR FOUR-FIVE  . NASAL POLYP EXCISION    . TONSILLECTOMY      Family History  Problem Relation Age of Onset  . Heart disease Mother 58       CHF  . Heart disease Father   . COPD Father   . Colon polyps Father   . Hypertension Sister   . Hyperlipidemia Sister   . Stomach cancer Neg Hx   . Colon cancer Neg Hx   . Esophageal cancer Neg Hx   . Ulcerative colitis Neg Hx     SOCIAL HX: see hpi   Current Outpatient Medications:  .  aspirin 81 MG EC tablet, Take 81 mg by mouth daily.  , Disp: , Rfl:  .  cetirizine (ZYRTEC) 10 MG tablet, Takes one tablet every other day., Disp: , Rfl:  .  cholecalciferol (VITAMIN D) 1000 UNITS tablet, Take 1,000 Units by mouth daily. , Disp: , Rfl:  .  citalopram (CELEXA) 10 MG tablet, TAKE 1 TABLET BY MOUTH EVERY DAY, Disp: 90 tablet, Rfl: 1 .  DULoxetine (CYMBALTA) 30 MG capsule, Take 30 mg by mouth at bedtime., Disp: , Rfl:  .  fluticasone (FLONASE) 50 MCG/ACT nasal spray, Place 2 sprays into the nose daily.  , Disp: , Rfl:  .  gabapentin (NEURONTIN) 300 MG capsule, TAKE 2 CAPSULES BY MOUTH TWICE A DAY, Disp: 360 capsule, Rfl: 1 .  ketoconazole (NIZORAL)  2 % cream, APPLY TO AFFECTED AREA TWICE A DAY, Disp: , Rfl:  .  Melatonin 3 MG TABS, Take by mouth., Disp: , Rfl:  .  Multiple Vitamin (MULTIVITAMIN) tablet, Take 1 tablet by mouth daily., Disp: , Rfl:  .  omeprazole (PRILOSEC) 20 MG capsule, Take 20 mg by mouth every other day. , Disp: , Rfl:  .  rosuvastatin (CRESTOR) 5 MG tablet, Take 5 mg by mouth daily., Disp: , Rfl:  .  tamsulosin (FLOMAX) 0.4 MG CAPS capsule, Take 1 capsule (0.4 mg total) by mouth daily. (Patient not taking: Reported on 06/28/2019), Disp: 30 capsule, Rfl: 3 .  valACYclovir (VALTREX) 1000 MG tablet, Take 1 tablet (1,000 mg total) by mouth 3 (three) times daily. (Patient not taking: Reported on 06/07/2019), Disp: 21 tablet, Rfl: 0  EXAM:  VITALS per patient if  applicable:  GENERAL: alert, oriented, appears well and in no acute distress  HEENT: atraumatic, conjunttiva clear, no obvious abnormalities on inspection of external nose and ears  NECK: normal movements of the head and neck  LUNGS: on inspection no signs of respiratory distress, breathing rate appears normal, no obvious gross SOB, gasping or wheezing  CV: no obvious cyanosis  MS: moves all visible extremities without noticeable abnormality  PSYCH/NEURO: pleasant and cooperative, no obvious depression or anxiety, speech and thought processing grossly intact  ASSESSMENT AND PLAN:  Discussed the following assessment and plan:  Skin lesion  Tick bite, initial encounter  -we discussed possible serious and likely etiologies, options for evaluation and workup, limitations of telemedicine visit vs in person visit, treatment, treatment risks and precautions. Discussed tick borne illnesses found in [place] and [place], signs and symptoms, potential complications, indications for treatment and prophylactic treatment for lymes. He opted for observation as does not feel tick was biting long and is unsure if even was biting. The agree to seek prompt medical care for treatment if he develops any symptoms.   I discussed the assessment and treatment plan with the patient. The patient was provided an opportunity to ask questions and all were answered. The patient agreed with the plan and demonstrated an understanding of the instructions.   The patient was advised to call back or seek an in-person evaluation if the symptoms worsen or if the condition fails to improve as anticipated.   Lucretia Kern, DO

## 2020-01-27 DIAGNOSIS — H8190 Unspecified disorder of vestibular function, unspecified ear: Secondary | ICD-10-CM | POA: Insufficient documentation

## 2020-01-27 DIAGNOSIS — H903 Sensorineural hearing loss, bilateral: Secondary | ICD-10-CM | POA: Insufficient documentation

## 2020-01-27 DIAGNOSIS — R42 Dizziness and giddiness: Secondary | ICD-10-CM | POA: Diagnosis not present

## 2020-01-27 DIAGNOSIS — H6123 Impacted cerumen, bilateral: Secondary | ICD-10-CM | POA: Diagnosis not present

## 2020-02-05 NOTE — Progress Notes (Signed)
Cardiology Office Note:    Date:  02/10/2020   ID:  Donald Pope, DOB June 07, 1945, MRN [id number]  PCP:  Donald Post, MD  Cardiologist:  Donald Grooms, MD   Referring MD: Donald Post, MD   Chief Complaint  Patient presents with  . Coronary Artery Disease  . Obesity    History of Present Illness:    Donald Pope is a 74 y.o. male with a hx of previously followed by Dr. Verl Pope and Dr. Aundra Pope with history of nonobstructive CAD, chronic bilateral lower extremity pain, hyperlipidemia, and obstructive sleep apnea.  Donald Pope is doing well.  He is concerned that his weight continues to climb.  He is significantly active but continues to gain weight.  He does not have chest discomfort or cardiovascular complaints.  He denies claudication.  He is compliant with CPAP.  He denies orthopnea and PND.  He participates in various activities including pickleball, swimming, and walking.  He gets greater than 30 minutes of moderate activity at least 6 days/week.  He drinks alcohol on weekends.  Past Medical History:  Diagnosis Date  . Allergy   . Benign prostatic hypertrophy    possible  . CAD, NATIVE VESSEL 10/23/2009  . DEPRESSION 09/13/2008  . False positive stress test    history of, suggestive of inferior ischemia  . GERD 09/13/2008  . HYPERLIPIDEMIA 09/13/2008  . OBSTRUCTIVE SLEEP APNEA 10/23/2009   cpap  . RBBB 10/26/2009   [place]  . Sleep apnea    Cpap  . Spinal stenosis of lumbar region 08/18/2012  . THROMBOCYTOPENIA 09/13/2008    Past Surgical History:  Procedure Laterality Date  . CARDIAC CATHETERIZATION     10 yrs ago  . COLONOSCOPY  06/27/2016  . MAXIMUM ACCESS (MAS)POSTERIOR LUMBAR INTERBODY FUSION (PLIF) 1 LEVEL N/A 05/27/2013   Procedure: FOR MAXIMUM ACCESS (MAS) POSTERIOR LUMBAR INTERBODY FUSION (PLIF) 1 LEVEL ,LUMBAR FOUR-FIVE;  Surgeon: Donald Hamad, MD;  Location: [facility];  Service: Neurosurgery;  Laterality: N/A;  FOR MAXIMUM ACCESS (MAS) POSTERIOR  LUMBAR INTERBODY FUSION (PLIF) 1 LEVEL ,LUMBAR FOUR-FIVE  . NASAL POLYP EXCISION    . TONSILLECTOMY      Current Medications: Current Meds  Medication Sig  . aspirin 81 MG EC tablet Take 81 mg by mouth daily.    . cetirizine (ZYRTEC) 10 MG tablet Takes one tablet every other day.  . cholecalciferol (VITAMIN D) 1000 UNITS tablet Take 1,000 Units by mouth daily.   . citalopram (CELEXA) 10 MG tablet TAKE 1 TABLET BY MOUTH EVERY DAY  . DULoxetine (CYMBALTA) 30 MG capsule Take 30 mg by mouth at bedtime.  . fluticasone (FLONASE) 50 MCG/ACT nasal spray Place 2 sprays into the nose daily.    Marland Kitchen gabapentin (NEURONTIN) 300 MG capsule TAKE 2 CAPSULES BY MOUTH TWICE A DAY  . Melatonin 3 MG TABS Take by mouth.  . Multiple Vitamin (MULTIVITAMIN) tablet Take 1 tablet by mouth daily.  Marland Kitchen omeprazole (PRILOSEC) 20 MG capsule Take 20 mg by mouth every other day.   . rosuvastatin (CRESTOR) 5 MG tablet Take 5 mg by mouth daily.     Allergies:   Adhesive [tape], Codeine sulfate, and Latex   Social History   Socioeconomic History  . Marital status: Married    Spouse name: Not on file  . Number of children: 1  . Years of education: 11   . Highest education level: Bachelor's degree (e.g., BA, AB, BS)  Occupational History  .  Occupation: retired  Tobacco Use  . Smoking status: Never Smoker  . Smokeless tobacco: Never Used  Vaping Use  . Vaping Use: Never used  Substance and Sexual Activity  . Alcohol use: Yes    Alcohol/week: 1.0 standard drink    Types: 1 Glasses of wine per week    Comment: 4-5 times a week  . Drug use: No  . Sexual activity: Not Currently  Other Topics Concern  . Not on file  Social History Narrative   Lives with wife in [place] home. Son lives in [place], [place], 1 grandson.    Animal-lover, especially dogs.   Exercises routinely at [facility], enjoys yoga   Social Determinants of Health   Financial Resource Strain: Low Risk   . Difficulty of Paying Living Expenses: Not hard  at all  Food Insecurity: No Food Insecurity  . Worried About Charity fundraiser in the Last Year: Never true  . Ran Out of Food in the Last Year: Never true  Transportation Needs: No Transportation Needs  . Lack of Transportation (Medical): No  . Lack of Transportation (Non-Medical): No  Physical Activity: Sufficiently Active  . Days of Exercise per Week: 7 days  . Minutes of Exercise per Session: 50 min  Stress: No Stress Concern Present  . Feeling of Stress : Only a little  Social Connections: Socially Integrated  . Frequency of Communication with Friends and Family: More than three times a week  . Frequency of Social Gatherings with Friends and Family: Once a week  . Attends Religious Services: More than 4 times per year  . Active Member of Clubs or Organizations: No  . Attends Archivist Meetings: More than 4 times per year  . Marital Status: Married     Family History: The patient's family history includes COPD in his father; Colon polyps in his father; Heart disease in his father; Heart disease (age of onset: 74) in his mother; Hyperlipidemia in his sister; Hypertension in his sister. There is no history of Stomach cancer, Colon cancer, Esophageal cancer, or Ulcerative colitis.  ROS:   Please see the history of present illness.    He gets cramps in his calves.  This happens with or without low-dose statin therapy.  He occasionally gets "hot flashes" where he feels somewhat clammy and hot.  This occurs at random.  He is also had Mnire's disease episodes.  He is limiting sodium in his diet.  He denies tinnitus.  Severe vertigo accompanies those.  All other systems reviewed and are negative.  EKGs/Labs/Other Studies Reviewed:    The following studies were reviewed today: No new data  EKG:  EKG normal sinus rhythm, left axis deviation, right bundle branch block, biatrial abnormality, and when compared to the prior tracing performed in 2019, no changes noted.  Recent  Labs: No results found for requested labs within last 8760 hours.  Recent Lipid Panel    Component Value Date/Time   CHOL 161 06/16/2017 0853   TRIG 262.0 (H) 06/16/2017 0853   HDL 40.30 06/16/2017 0853   CHOLHDL 4 06/16/2017 0853   VLDL 52.4 (H) 06/16/2017 0853   LDLCALC 56 08/15/2014 0932   LDLDIRECT 89.0 06/16/2017 0853    Physical Exam:    VS:  BP 138/84   Pulse 91   Ht 5\' 8"  (1.727 m)   Wt 203 lb 6.4 oz (92.3 kg)   SpO2 96%   BMI 30.93 kg/m     Wt Readings from Last 3  Encounters:  02/10/20 203 lb 6.4 oz (92.3 kg)  07/12/19 201 lb (91.2 kg)  06/28/19 201 lb (91.2 kg)     GEN: "Weight gain" with mild obesity.  No change over the past 24 months.Marland Kitchen  Up approximately. No acute distress HEENT: Normal NECK: No JVD. LYMPHATICS: No lymphadenopathy CARDIAC:  RRR without murmur, gallop, or edema. VASCULAR:  Normal Pulses. No bruits. RESPIRATORY:  Clear to auscultation without rales, wheezing or rhonchi  ABDOMEN: Soft, non-tender, non-distended, No pulsatile mass, MUSCULOSKELETAL: No deformity  SKIN: Warm and dry NEUROLOGIC:  Alert and oriented x 3 PSYCHIATRIC:  Normal affect   ASSESSMENT:    1. Atherosclerosis of native coronary artery of native heart without angina pectoris   2. Hyperlipidemia, unspecified hyperlipidemia type   3. RBBB   4. PAD (peripheral artery disease) ([place])   5. Obstructive sleep apnea   6. Prediabetes   7. Obese abdomen   8. Educated about COVID-19 virus infection    PLAN:    In order of problems listed above:  1. Primary prevention tactics are rediscussed including weight control, aerobic activity, sleep, management of sleep apnea, blood pressure control, and limiting salt in his diet.  No testing is needed at this time.  We will refer him to the medical weight loss clinic to facilitate weight loss and manage prediabetes. 2. Continue low-dose rosuvastatin 5 mg/day.  He promises to have blood work done by Dr. Elease Hashimoto before the year is  out. 3. EKG is unchanged from prior.  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 4. Asymptomatic with excellent pulses bilaterally.  Secondary prevention discussed. 5. Encourage compliance with CPAP. 6. Recommended [facility] program. 7. The above 8. He is vaccinated and boosted.  Still practicing social medication.   Medication Adjustments/Labs and Tests Ordered: Current medicines are reviewed at length with the patient today.  Concerns regarding medicines are outlined above.  Orders Placed This Encounter  Procedures  . Amb Ref to Medical Weight Management  . EKG 12-Lead   No orders of the defined types were placed in this encounter.   Patient Instructions  Medication Instructions:  Your physician recommends that you continue on your current medications as directed. Please refer to the Current Medication list given to you today.  *If you need a refill on your cardiac medications before your next appointment, please call your pharmacy*   Lab Work: None If you have labs (blood work) drawn today and your tests are completely normal, you will receive your results only by: Marland Kitchen MyChart Message (if you have MyChart) OR . A paper copy in the mail If you have any lab test that is abnormal or we need to change your treatment, we will call you to review the results.   Testing/Procedures: None   Follow-Up: At [facility], you and your health needs are our priority.  As part of our continuing mission to provide you with exceptional heart care, we have created designated Provider Care Teams.  These Care Teams include your primary Cardiologist (physician) and Advanced Practice Providers (APPs -  Physician Assistants and Nurse Practitioners) who all work together to provide you with the care you need, when you need it.  We recommend signing up for the patient portal called "MyChart".  Sign up information is provided on this After Visit Summary.   MyChart is used to connect with patients for Virtual Visits (Telemedicine).  Patients are able to view lab/test results, encounter notes, upcoming appointments, etc.  Non-urgent messages can be sent to your provider as well.   To learn more about what you can do with MyChart, go to [email or web address].    Your next appointment:   12 month(s)  The format for your next appointment:   In Person  Provider:   You may see Donald Grooms, MD or one of the following Advanced Practice Providers on your designated Care Team:    Truitt Merle, NP  Cecilie Kicks, NP  Kathyrn Drown, NP    Other Instructions      Signed, Donald Grooms, MD  02/10/2020 2:35 PM    [place]

## 2020-02-09 DIAGNOSIS — N401 Enlarged prostate with lower urinary tract symptoms: Secondary | ICD-10-CM | POA: Diagnosis not present

## 2020-02-09 DIAGNOSIS — R3914 Feeling of incomplete bladder emptying: Secondary | ICD-10-CM | POA: Diagnosis not present

## 2020-02-10 ENCOUNTER — Ambulatory Visit (INDEPENDENT_AMBULATORY_CARE_PROVIDER_SITE_OTHER): Payer: PPO | Admitting: Interventional Cardiology

## 2020-02-10 ENCOUNTER — Other Ambulatory Visit: Payer: Self-pay

## 2020-02-10 ENCOUNTER — Encounter: Payer: Self-pay | Admitting: Interventional Cardiology

## 2020-02-10 VITALS — BP 138/84 | HR 91 | Ht 68.0 in | Wt 203.4 lb

## 2020-02-10 DIAGNOSIS — E65 Localized adiposity: Secondary | ICD-10-CM | POA: Diagnosis not present

## 2020-02-10 DIAGNOSIS — Z7189 Other specified counseling: Secondary | ICD-10-CM | POA: Diagnosis not present

## 2020-02-10 DIAGNOSIS — R7303 Prediabetes: Secondary | ICD-10-CM

## 2020-02-10 DIAGNOSIS — E785 Hyperlipidemia, unspecified: Secondary | ICD-10-CM | POA: Diagnosis not present

## 2020-02-10 DIAGNOSIS — I739 Peripheral vascular disease, unspecified: Secondary | ICD-10-CM

## 2020-02-10 DIAGNOSIS — G4733 Obstructive sleep apnea (adult) (pediatric): Secondary | ICD-10-CM

## 2020-02-10 DIAGNOSIS — I4510 Unspecified right bundle-branch block: Secondary | ICD-10-CM

## 2020-02-10 DIAGNOSIS — I2510 Atherosclerotic heart disease of native coronary artery without angina pectoris: Secondary | ICD-10-CM | POA: Diagnosis not present

## 2020-02-10 NOTE — Patient Instructions (Signed)

## 2020-02-24 ENCOUNTER — Other Ambulatory Visit: Payer: Self-pay | Admitting: Family Medicine

## 2020-03-01 DIAGNOSIS — G4733 Obstructive sleep apnea (adult) (pediatric): Secondary | ICD-10-CM | POA: Diagnosis not present

## 2020-03-09 ENCOUNTER — Ambulatory Visit (INDEPENDENT_AMBULATORY_CARE_PROVIDER_SITE_OTHER): Payer: PPO | Admitting: Family Medicine

## 2020-03-09 ENCOUNTER — Encounter (INDEPENDENT_AMBULATORY_CARE_PROVIDER_SITE_OTHER): Payer: Self-pay | Admitting: Family Medicine

## 2020-03-09 ENCOUNTER — Other Ambulatory Visit: Payer: Self-pay

## 2020-03-09 VITALS — BP 120/72 | HR 82 | Temp 98.1°F | Ht 67.0 in | Wt 200.0 lb

## 2020-03-09 DIAGNOSIS — E785 Hyperlipidemia, unspecified: Secondary | ICD-10-CM | POA: Diagnosis not present

## 2020-03-09 DIAGNOSIS — Z9989 Dependence on other enabling machines and devices: Secondary | ICD-10-CM

## 2020-03-09 DIAGNOSIS — Z0289 Encounter for other administrative examinations: Secondary | ICD-10-CM

## 2020-03-09 DIAGNOSIS — R0602 Shortness of breath: Secondary | ICD-10-CM

## 2020-03-09 DIAGNOSIS — Z6831 Body mass index (BMI) 31.0-31.9, adult: Secondary | ICD-10-CM

## 2020-03-09 DIAGNOSIS — E559 Vitamin D deficiency, unspecified: Secondary | ICD-10-CM | POA: Diagnosis not present

## 2020-03-09 DIAGNOSIS — F3289 Other specified depressive episodes: Secondary | ICD-10-CM | POA: Diagnosis not present

## 2020-03-09 DIAGNOSIS — E669 Obesity, unspecified: Secondary | ICD-10-CM | POA: Diagnosis not present

## 2020-03-09 DIAGNOSIS — K219 Gastro-esophageal reflux disease without esophagitis: Secondary | ICD-10-CM | POA: Diagnosis not present

## 2020-03-09 DIAGNOSIS — R5383 Other fatigue: Secondary | ICD-10-CM | POA: Insufficient documentation

## 2020-03-09 DIAGNOSIS — G4733 Obstructive sleep apnea (adult) (pediatric): Secondary | ICD-10-CM | POA: Insufficient documentation

## 2020-03-09 DIAGNOSIS — F39 Unspecified mood [affective] disorder: Secondary | ICD-10-CM | POA: Diagnosis not present

## 2020-03-09 DIAGNOSIS — R7303 Prediabetes: Secondary | ICD-10-CM | POA: Insufficient documentation

## 2020-03-09 DIAGNOSIS — E7849 Other hyperlipidemia: Secondary | ICD-10-CM | POA: Diagnosis not present

## 2020-03-09 DIAGNOSIS — I739 Peripheral vascular disease, unspecified: Secondary | ICD-10-CM | POA: Diagnosis not present

## 2020-03-10 LAB — CBC WITH DIFFERENTIAL/PLATELET
Basophils Absolute: 0.1 10*3/uL (ref 0.0–0.2)
Basos: 1 %
EOS (ABSOLUTE): 0.1 10*3/uL (ref 0.0–0.4)
Eos: 1 %
Hematocrit: 40.9 % (ref 37.5–51.0)
Hemoglobin: 14 g/dL (ref 13.0–17.7)
Immature Grans (Abs): 0 10*3/uL (ref 0.0–0.1)
Immature Granulocytes: 0 %
Lymphocytes Absolute: 1.8 10*3/uL (ref 0.7–3.1)
Lymphs: 28 %
MCH: 31.1 pg (ref 26.6–33.0)
MCHC: 34.2 g/dL (ref 31.5–35.7)
MCV: 91 fL (ref 79–97)
Monocytes Absolute: 0.6 10*3/uL (ref 0.1–0.9)
Monocytes: 9 %
Neutrophils Absolute: 3.9 10*3/uL (ref 1.4–7.0)
Neutrophils: 61 %
Platelets: 126 10*3/uL — ABNORMAL LOW (ref 150–450)
RBC: 4.5 x10E6/uL (ref 4.14–5.80)
RDW: 13.1 % (ref 11.6–15.4)
WBC: 6.4 10*3/uL (ref 3.4–10.8)

## 2020-03-10 LAB — COMPREHENSIVE METABOLIC PANEL
ALT: 23 IU/L (ref 0–44)
AST: 27 IU/L (ref 0–40)
Albumin/Globulin Ratio: 1.7 (ref 1.2–2.2)
Albumin: 4.5 g/dL (ref 3.7–4.7)
Alkaline Phosphatase: 84 IU/L (ref 44–121)
BUN/Creatinine Ratio: 16 (ref 10–24)
BUN: 17 mg/dL (ref 8–27)
Bilirubin Total: 0.3 mg/dL (ref 0.0–1.2)
CO2: 23 mmol/L (ref 20–29)
Calcium: 9.2 mg/dL (ref 8.6–10.2)
Chloride: 101 mmol/L (ref 96–106)
Creatinine, Ser: 1.06 mg/dL (ref 0.76–1.27)
GFR calc Af Amer: 79 mL/min/{1.73_m2} (ref >59)
GFR calc non Af Amer: 68 mL/min/{1.73_m2} (ref >59)
Globulin, Total: 2.6 g/dL (ref 1.5–4.5)
Glucose: 113 mg/dL — ABNORMAL HIGH (ref 65–99)
Potassium: 4.5 mmol/L (ref 3.5–5.2)
Sodium: 139 mmol/L (ref 134–144)
Total Protein: 7.1 g/dL (ref 6.0–8.5)

## 2020-03-10 LAB — LIPID PANEL
Chol/HDL Ratio: 4.2 ratio (ref 0.0–5.0)
Cholesterol, Total: 152 mg/dL (ref 100–199)
HDL: 36 mg/dL — ABNORMAL LOW (ref >39)
LDL Chol Calc (NIH): 85 mg/dL (ref 0–99)
Triglycerides: 183 mg/dL — ABNORMAL HIGH (ref 0–149)
VLDL Cholesterol Cal: 31 mg/dL (ref 5–40)

## 2020-03-10 LAB — T3: T3, Total: 121 ng/dL (ref 71–180)

## 2020-03-10 LAB — TSH: TSH: 2.51 u[IU]/mL (ref 0.450–4.500)

## 2020-03-10 LAB — INSULIN, RANDOM: INSULIN: 35.6 u[IU]/mL — ABNORMAL HIGH (ref 2.6–24.9)

## 2020-03-10 LAB — HEMOGLOBIN A1C
Est. average glucose Bld gHb Est-mCnc: 131 mg/dL
Hgb A1c MFr Bld: 6.2 % — ABNORMAL HIGH (ref 4.8–5.6)

## 2020-03-10 LAB — FOLATE: Folate: >20 ng/mL (ref >3.0)

## 2020-03-10 LAB — VITAMIN B12: Vitamin B-12: 449 pg/mL (ref 232–1245)

## 2020-03-10 LAB — T4: T4, Total: 5.5 ug/dL (ref 4.5–12.0)

## 2020-03-10 LAB — VITAMIN D 25 HYDROXY (VIT D DEFICIENCY, FRACTURES): Vit D, 25-Hydroxy: 33.2 ng/mL (ref 30.0–100.0)

## 2020-03-13 NOTE — Progress Notes (Signed)
Chief Complaint:   OBESITY Donald Pope (MR# [id number]) is a 74 y.o. male who presents for evaluation and treatment of obesity and related comorbidities. Current BMI is Body mass index is 31.32 kg/m. Moses has been struggling with his weight for many years and has been unsuccessful in either losing weight, maintaining weight loss, or reaching his healthy weight goal.  Szymon is currently in the action stage of change and ready to dedicate time achieving and maintaining a healthier weight. Olaf is interested in becoming our patient and working on intensive lifestyle modifications including (but not limited to) diet and exercise for weight loss.  Timonthy lives with his wife, Tomi Bamberger.  He is retired.  He craves pizza, cheeseburgers, ice cream, lasagna, etc.  Snacks on nuts and [employer] chips.  He walks, plays pickle ball, and does yoga for exercise.  Thelbert's habits were reviewed today and are as follows: His family eats meals together, he thinks his family will eat healthier with him, his desired weight loss is 23 pounds, he started gaining weight 20 years ago, his heaviest weight ever was 202 pounds, he is a picky eater and doesn't like to eat healthier foods, he craves pizza, cheeseburgers, ice cream, sandwiches with cheese, Italian dishes, he skips lunch once a week, he is frequently drinking liquids with calories, he frequently makes poor food choices, he has problems with excessive hunger, he frequently eats larger portions than normal and he struggles with emotional eating.  This is the patient's first visit at [facility].  The patient's NEW PATIENT PACKET that they filled out prior to today's office visit was reviewed at length and information from that paperwork was included within the following office visit note.    Included in the packet: current and past health history, medications, allergies, ROS, gynecologic history (women only), surgical history, family history,  social history, weight history, weight loss surgery history (for those that have had weight loss surgery), nutritional evaluation, mood and food questionnaire along with a depression screening (PHQ9) on all patients, an Epworth questionnaire, sleep habits questionnaire, patient life and health improvement goals questionnaire. These will all be scanned into the patient's chart under media.   During the visit, I independently reviewed the patient's EKG, bioimpedance scale results, and indirect calorimeter results. I used this information to tailor a meal plan for the patient that will help Brando to lose weight and will improve his obesity-related conditions going forward.  I performed a medically necessary appropriate examination and/or evaluation. I discussed the assessment and treatment plan with the patient. The patient was provided an opportunity to ask questions and all were answered. The patient agreed with the plan and demonstrated an understanding of the instructions. Labs were ordered today (unless patient declined them) and will be reviewed with the patient at our next visit unless more critical results need to be addressed immediately. Clinical information was updated and documented in the EMR.  Time spent on visit including pre-visit chart review and post-visit care was estimated to be 60-74 minutes.  A separate 15 minutes was spent on risk counseling (see above/below).   Depression Screen Amaru's Food and Mood (modified PHQ-9) score was 4.  Depression screen [facility] 2/9 03/09/2020  Decreased Interest 0  Down, Depressed, Hopeless 1  PHQ - 2 Score 1  Altered sleeping 0  Tired, decreased energy 1  Change in appetite 0  Feeling bad or failure about yourself  1  Trouble concentrating 1  Moving slowly or  fidgety/restless 0  Suicidal thoughts 0  PHQ-9 Score 4  Difficult doing work/chores Not difficult at all   Assessment/Plan:   1. Other fatigue Tulio admits to daytime somnolence and  reports waking up still tired. Patent has a history of symptoms of daytime fatigue and morning fatigue. Oiva generally gets 7 hours of sleep per night, and states that he has generally restful sleep. Snoring is not present. Apneic episodes are not present. Epworth Sleepiness Score is 9.  Kadien does feel that his weight is causing his energy to be lower than it should be. Fatigue may be related to obesity, depression or many other causes. Labs will be ordered, and in the meanwhile, Wilmore will focus on self care including making healthy food choices, increasing physical activity and focusing on stress reduction.  - Vitamin B12 - Folate - T3 - T4 - TSH  2. SOB (shortness of breath) on exertion Herbie Baltimore notes increasing shortness of breath with exercising and seems to be worsening over time with weight gain. He notes getting out of breath sooner with activity than he used to. This has not gotten worse recently. Trentyn denies shortness of breath at rest or orthopnea.  Jahden does not feel that he gets out of breath more easily that he used to when he exercises. Tegh's shortness of breath appears to be obesity related and exercise induced. He has agreed to work on weight loss and gradually increase exercise to treat his exercise induced shortness of breath. Will continue to monitor closely.  - CBC with Differential/Platelet  3. Prediabetes Ketih has a diagnosis of prediabetes based on his elevated HgA1c and was informed this puts him at greater risk of developing diabetes. He continues to work on diet and exercise to decrease his risk of diabetes. He denies nausea or hypoglycemia.  Plan:  Will check labs today.  Lab Results  Component Value Date   HGBA1C 6.2 (H) 03/09/2020   Lab Results  Component Value Date   INSULIN 35.6 (H) 03/09/2020   - Comprehensive metabolic panel - Hemoglobin A1c - Insulin, random  4. Other hyperlipidemia Mallie has hyperlipidemia and has been trying to  improve his cholesterol levels with intensive lifestyle modification including a low saturated fat diet, exercise and weight loss. He denies any chest pain, claudication or myalgias.  He is taking Crestor 5 mg daily.  Plan:  Continue Crestor.  Will check labs today.  Lab Results  Component Value Date   ALT 23 03/09/2020   AST 27 03/09/2020   ALKPHOS 84 03/09/2020   BILITOT 0.3 03/09/2020   Lab Results  Component Value Date   CHOL 152 03/09/2020   HDL 36 (L) 03/09/2020   LDLCALC 85 03/09/2020   LDLDIRECT 89.0 06/16/2017   TRIG 183 (H) 03/09/2020   CHOLHDL 4.2 03/09/2020   - Lipid panel  5. Gastroesophageal reflux disease, unspecified whether esophagitis present Ely takes omeprazole 20 mg every other day for his GERD symptoms.    Plan:  Intensive lifestyle modifications are the first line treatment for this issue. We discussed several lifestyle modifications today and he will continue to work on diet, exercise and weight loss efforts. Orders and follow up as documented in patient record.   Counseling . If a person has gastroesophageal reflux disease (GERD), food and stomach acid move back up into the esophagus and cause symptoms or problems such as damage to the esophagus. . Anti-reflux measures include: raising the head of the bed, avoiding tight clothing or belts, avoiding eating  late at night, not lying down shortly after mealtime, and achieving weight loss. . Avoid ASA, NSAID's, caffeine, alcohol, and tobacco.  . OTC Pepcid and/or Tums are often very helpful for as needed use.  Marland Kitchen However, for persisting chronic or daily symptoms, stronger medications like Omeprazole may be needed. . You may need to avoid foods and drinks such as: ? Coffee and tea (with or without caffeine). ? Drinks that contain alcohol. ? Energy drinks and sports drinks. ? Bubbly (carbonated) drinks or sodas. ? Chocolate and cocoa. ? Peppermint and mint flavorings. ? Garlic and  onions. ? Horseradish. ? Spicy and acidic foods. These include peppers, chili powder, curry powder, vinegar, hot sauces, and BBQ sauce. ? Citrus fruit juices and citrus fruits, such as oranges, lemons, and limes. ? Tomato-based foods. These include red sauce, chili, salsa, and pizza with red sauce. ? Fried and fatty foods. These include donuts, french fries, potato chips, and high-fat dressings. ? High-fat meats. These include hot dogs, rib eye steak, sausage, ham, and bacon.  6. OSA on CPAP Amron has a diagnosis of sleep apnea. He reports that he is using a CPAP regularly.  Epworth sleepiness score is 9.  Plan:  Intensive lifestyle modifications are the first line treatment for this issue. We discussed several lifestyle modifications today and he will continue to work on diet, exercise and weight loss efforts. We will continue to monitor. Orders and follow up as documented in patient record.    7. Vitamin D deficiency TRAYCE MAINO has a history of Vitamin D deficiency with resultant generalized fatigue as his primary symptom.  he is taking OTC vitamin D 1,000 daily for this deficiency and tolerating it well without side-effect.    Plan:   - Discussed importance of vitamin D (as well as calcium) to their health and wellbeing.   - We reviewed possible symptoms of low Vitamin D including low energy, depressed mood, muscle aches, joint aches, osteoporosis, etc.  - We discussed that low Vitamin D levels may be linked to an increased risk of cardiovascular events, and even increased risk of cancers, such as colon and breast.   - Educated pt that weight loss will likely improve availability of vitamin D, thus encouraged Anchor to continue with meal plan and their weight loss efforts to further improve this condition.  - I recommend pt take daily OTC vit D 1,000 IU, which pt agrees to after discussion of the risks and benefits of this medication.  Will check Vitamin D level today.  -  Informed patient this may be a lifelong thing, and he was encouraged to continue to take the medicine until told otherwise.  We will need to monitor levels regularly (every 3-4 mo on average) to keep levels within normal limits.   - All pt's questions and concerns regarding this condition addressed.  - VITAMIN D 25 Hydroxy (Vit-D Deficiency, Fractures)  8. Other depression, with emotional eating With anxiety.  Kingstin is taking Celexa and Cymbalta.  Denies SI.  Kandon is struggling with emotional eating and using food for comfort to the extent that it is negatively impacting his health. He has been working on behavior modification techniques to help reduce his emotional eating and has been unsuccessful. He shows no sign of suicidal or homicidal ideations.  Plan:  Behavior modification techniques were discussed today to help Samel deal with his emotional/non-hunger eating behaviors.  Orders and follow up as documented in patient record.   9. Class 1 obesity with  serious comorbidity and body mass index (BMI) of 31.0 to 31.9 in adult, unspecified obesity type  Alexzander is currently in the action stage of change and his goal is to continue with weight loss efforts. I recommend Koda begin the structured treatment plan as follows:  He has agreed to the Category 2 Plan.  Exercise goals: As is.   Behavioral modification strategies: increasing lean protein intake, increasing water intake, meal planning and cooking strategies, keeping healthy foods in the home and planning for success.  He was informed of the importance of frequent follow-up visits to maximize his success with intensive lifestyle modifications for his multiple health conditions. He was informed we would discuss his lab results at his next visit unless there is a critical issue that needs to be addressed sooner. Tyton agreed to keep his next visit at the agreed upon time to discuss these results.  Objective:   Blood pressure 120/72,  pulse 82, temperature 98.1 F (36.7 C), height 5\' 7"  (1.702 m), weight 200 lb (90.7 kg), SpO2 98 %. Body mass index is 31.32 kg/m.  Indirect Calorimeter completed today shows a VO2 of 278 and a REE of 1936.  His calculated basal metabolic rate is 1610 thus his basal metabolic rate is better than expected.  General: Cooperative, alert, well developed, in no acute distress. HEENT: Conjunctivae and lids unremarkable. Cardiovascular: Regular rhythm.  Lungs: Normal work of breathing. Neurologic: No focal deficits.   Lab Results  Component Value Date   CREATININE 1.06 03/09/2020   BUN 17 03/09/2020   NA 139 03/09/2020   K 4.5 03/09/2020   CL 101 03/09/2020   CO2 23 03/09/2020   Lab Results  Component Value Date   ALT 23 03/09/2020   AST 27 03/09/2020   ALKPHOS 84 03/09/2020   BILITOT 0.3 03/09/2020   Lab Results  Component Value Date   HGBA1C 6.2 (H) 03/09/2020   HGBA1C 5.8 (H) 11/27/2017   Lab Results  Component Value Date   INSULIN 35.6 (H) 03/09/2020   Lab Results  Component Value Date   TSH 2.510 03/09/2020   Lab Results  Component Value Date   CHOL 152 03/09/2020   HDL 36 (L) 03/09/2020   LDLCALC 85 03/09/2020   LDLDIRECT 89.0 06/16/2017   TRIG 183 (H) 03/09/2020   CHOLHDL 4.2 03/09/2020   Lab Results  Component Value Date   WBC 6.4 03/09/2020   HGB 14.0 03/09/2020   HCT 40.9 03/09/2020   MCV 91 03/09/2020   PLT 126 (L) 03/09/2020   Obesity Behavioral Intervention:   Approximately 15 minutes were spent on the discussion below.  ASK: We discussed the diagnosis of obesity with Herbie Baltimore today and Dhanush agreed to give [place] permission to discuss obesity behavioral modification therapy today.  ASSESS: Strider has the diagnosis of obesity and his BMI today is 31.4. Keola is in the action stage of change.   ADVISE: Cesare was educated on the multiple health risks of obesity as well as the benefit of weight loss to improve his health. He was advised of the need  for long term treatment and the importance of lifestyle modifications to improve his current health and to decrease his risk of future health problems.  AGREE: Multiple dietary modification options and treatment options were discussed and Tzion agreed to follow the recommendations documented in the above note.  ARRANGE: Tylerjames was educated on the importance of frequent visits to treat obesity as outlined per CMS and USPSTF guidelines and agreed to schedule  his next follow up appointment today.  Attestation Statements:   Reviewed by clinician on day of visit: allergies, medications, problem list, medical history, surgical history, family history, social history, and previous encounter notes.  I, Water quality scientist, CMA, am acting as Location manager for [employer], DO.  I have reviewed the above documentation for accuracy and completeness, and I agree with the above. Marjory Sneddon, D.O.  The [place] was signed into law in 2016 which includes the topic of electronic health records.  This provides immediate access to information in MyChart.  This includes consultation notes, operative notes, office notes, lab results and pathology reports.  If you have any questions about what you read please let us know at your next visit so we can discuss your concerns and take corrective action if need be.  We are right here with you.

## 2020-03-23 ENCOUNTER — Encounter (INDEPENDENT_AMBULATORY_CARE_PROVIDER_SITE_OTHER): Payer: Self-pay | Admitting: Family Medicine

## 2020-03-23 ENCOUNTER — Ambulatory Visit (INDEPENDENT_AMBULATORY_CARE_PROVIDER_SITE_OTHER): Payer: PPO | Admitting: Family Medicine

## 2020-03-23 ENCOUNTER — Other Ambulatory Visit: Payer: Self-pay

## 2020-03-23 VITALS — BP 122/79 | HR 76 | Temp 98.0°F | Ht 67.0 in | Wt 189.0 lb

## 2020-03-23 DIAGNOSIS — E669 Obesity, unspecified: Secondary | ICD-10-CM | POA: Diagnosis not present

## 2020-03-23 DIAGNOSIS — Z6830 Body mass index (BMI) 30.0-30.9, adult: Secondary | ICD-10-CM | POA: Diagnosis not present

## 2020-03-23 DIAGNOSIS — E559 Vitamin D deficiency, unspecified: Secondary | ICD-10-CM

## 2020-03-23 DIAGNOSIS — R7303 Prediabetes: Secondary | ICD-10-CM

## 2020-03-23 DIAGNOSIS — E7849 Other hyperlipidemia: Secondary | ICD-10-CM

## 2020-03-23 MED ORDER — VITAMIN D (ERGOCALCIFEROL) 1.25 MG (50000 UNIT) PO CAPS: 50000.0000 [IU] | ORAL_CAPSULE | ORAL | 0 refills | Status: DC

## 2020-03-29 NOTE — Progress Notes (Addendum)
Chief Complaint:   OBESITY Donald Pope is here to discuss his progress with his obesity treatment plan along with follow-up of his obesity related diagnoses. Donald Pope is on the Category 2 Plan and states he is following his eating plan approximately 98% of the time. Donald Pope states he is playing pickle ball, doing yoga, waling, and doing water aerobics for 60-120 minutes 4 times per week.  Today's visit was #: 2 Starting weight: 200 lbs Starting date: 03/09/2020 Today's weight: 189 lbs Today's date: 03/23/2020 Total lbs lost to date: 11 lbs Total lbs lost since last in-office visit: 11 lbs Total weight loss percentage to date: -5.50%   Interim History:  Here for his first f/up since starting the plan. He says he ate all foods on the plan.  No issues or concerns. Denies cravings or hunger.      Donald Pope has several questions today about the plan, timing of foods, his health goals and end-point etc.  He desires to make lots of substitutions for foods and wants to discuss healthy alternatives for meals.     Assessment/Plan:   1. Prediabetes -Worsening.   -Discussed labs with patient today.   A1c increased from 5.8 to 6.2, elevated fasting insulin also at >35.0.   Plan:   - I counseled patient on pathophysiology of disease process of Pre-DM.     - Stressed importance of dietary and lifestyle modifications resulting in weight loss as first line.  - continue to decrease simple carbs; increase fiber and proteins  - Handouts provided at pt's request after education provided and all concerns/questions addressed.    - Anticipatory guidance given.    -> Recheck A1c and fasting insulin level in approximately 3 months from last check or as deemed fit.   -> consider adding GLP-1 in future to aid with wt loss  Lab Results  Component Value Date   HGBA1C 6.2 (H) 03/09/2020   Lab Results  Component Value Date   INSULIN 35.6 (H) 03/09/2020      2. Other hyperlipidemia Discussed  labs with patient today.  Elevated triglycerides and low HDL.  He is taking Crestor 5 mg nightly.  Plan:   Continue Crestor, decrease simple carbs, increase exercise, etc., to improve triglyceride and HDl levels.  Lab Results  Component Value Date   ALT 23 03/09/2020   AST 27 03/09/2020   ALKPHOS 84 03/09/2020   BILITOT 0.3 03/09/2020   Lab Results  Component Value Date   CHOL 152 03/09/2020   HDL 36 (L) 03/09/2020   LDLCALC 85 03/09/2020   LDLDIRECT 89.0 06/16/2017   TRIG 183 (H) 03/09/2020   CHOLHDL 4.2 03/09/2020    3. Vitamin D deficiency Worsening.  Discussed labs with patient today.  Donald Pope's Vitamin D level was 33.2 on 03/09/2020. He is currently taking OTC vitamin D 1,000 IU each day. He denies nausea, vomiting or muscle weakness.  Plan:   - Discussed importance of vitamin D to their health and well-being.   - possible symptoms of low Vitamin D can be low energy, depressed mood, muscle aches, joint aches, osteoporosis etc.  - low Vitamin D levels may be linked to an increased risk of cardiovascular events and even increased risk of cancers- such as colon and breast.   - I recommend pt take a weekly prescription vit D- see script below    - this may be a lifelong thing, and we will need to monitor levels regularly to keep within normal limits.   -  weight loss will likely improve availability of vitamin D, thus encouraged Donald Pope to continue with meal plan and their weight loss efforts to further improve this condition  - Refill will be given when he is in the office at his follow-up office visits.   - Recheck level in 3 months.   -Start Vitamin D, Ergocalciferol, (DRISDOL) 1.25 MG (50000 UNIT) CAPS capsule; Take 1 capsule (50,000 Units total) by mouth every 7 (seven) days.  Dispense: 4 capsule; Refill: 0     4. Class 1 obesity with serious comorbidity and body mass index (BMI) of 30.0 to 30.9 in adult, unspecified obesity type  Donald Pope is currently in the action  stage of change. As such, his goal is to continue with weight loss efforts. He has agreed to the Category 3 Plan.   Exercise goals: As is.  Behavioral modification strategies: decreasing simple carbohydrates, meal planning and cooking strategies, better snacking choices and planning for success.  Donald Pope has agreed to follow-up with our clinic in 2.5 weeks. He was informed of the importance of frequent follow-up visits to maximize his success with intensive lifestyle modifications for his multiple health conditions.     Objective:   Blood pressure 122/79, pulse 76, temperature 98 F (36.7 C), height 5\' 7"  (1.702 m), weight 189 lb (85.7 kg), SpO2 97 %. Body mass index is 29.6 kg/m.  General: Cooperative, alert, well developed, in no acute distress. HEENT: Conjunctivae and lids unremarkable. Cardiovascular: Regular rhythm.  Lungs: Normal work of breathing. Neurologic: No focal deficits.   Lab Results  Component Value Date   CREATININE 1.06 03/09/2020   BUN 17 03/09/2020   NA 139 03/09/2020   K 4.5 03/09/2020   CL 101 03/09/2020   CO2 23 03/09/2020   Lab Results  Component Value Date   ALT 23 03/09/2020   AST 27 03/09/2020   ALKPHOS 84 03/09/2020   BILITOT 0.3 03/09/2020   Lab Results  Component Value Date   HGBA1C 6.2 (H) 03/09/2020   HGBA1C 5.8 (H) 11/27/2017   Lab Results  Component Value Date   INSULIN 35.6 (H) 03/09/2020   Lab Results  Component Value Date   TSH 2.510 03/09/2020   Lab Results  Component Value Date   CHOL 152 03/09/2020   HDL 36 (L) 03/09/2020   LDLCALC 85 03/09/2020   LDLDIRECT 89.0 06/16/2017   TRIG 183 (H) 03/09/2020   CHOLHDL 4.2 03/09/2020   Lab Results  Component Value Date   WBC 6.4 03/09/2020   HGB 14.0 03/09/2020   HCT 40.9 03/09/2020   MCV 91 03/09/2020   PLT 126 (L) 03/09/2020   Attestation Statements:   Reviewed by clinician on day of visit: allergies, medications, problem list, medical history, surgical history,  family history, social history, and previous encounter notes.  Time spent on visit including pre-visit chart review and post-visit care and charting was 55+ minutes.   I, Water quality scientist, CMA, am acting as Location manager for [employer], DO.  I have reviewed the above documentation for accuracy and completeness, and I agree with the above. Marjory Sneddon, D.O.  The [place] was signed into law in 2016 which includes the topic of electronic health records.  This provides immediate access to information in MyChart.  This includes consultation notes, operative notes, office notes, lab results and pathology reports.  If you have any questions about what you read please let us know at your next visit so we can discuss your concerns  and take corrective action if need be.  We are right here with you.

## 2020-04-03 DIAGNOSIS — L309 Dermatitis, unspecified: Secondary | ICD-10-CM | POA: Diagnosis not present

## 2020-04-03 DIAGNOSIS — L570 Actinic keratosis: Secondary | ICD-10-CM | POA: Diagnosis not present

## 2020-04-03 DIAGNOSIS — Z85828 Personal history of other malignant neoplasm of skin: Secondary | ICD-10-CM | POA: Diagnosis not present

## 2020-04-03 DIAGNOSIS — L821 Other seborrheic keratosis: Secondary | ICD-10-CM | POA: Diagnosis not present

## 2020-04-03 DIAGNOSIS — L304 Erythema intertrigo: Secondary | ICD-10-CM | POA: Diagnosis not present

## 2020-04-03 DIAGNOSIS — L812 Freckles: Secondary | ICD-10-CM | POA: Diagnosis not present

## 2020-04-03 DIAGNOSIS — D1801 Hemangioma of skin and subcutaneous tissue: Secondary | ICD-10-CM | POA: Diagnosis not present

## 2020-04-03 DIAGNOSIS — L820 Inflamed seborrheic keratosis: Secondary | ICD-10-CM | POA: Diagnosis not present

## 2020-04-03 DIAGNOSIS — L218 Other seborrheic dermatitis: Secondary | ICD-10-CM | POA: Diagnosis not present

## 2020-04-12 ENCOUNTER — Ambulatory Visit (INDEPENDENT_AMBULATORY_CARE_PROVIDER_SITE_OTHER): Payer: PPO | Admitting: Family Medicine

## 2020-04-12 ENCOUNTER — Other Ambulatory Visit: Payer: Self-pay

## 2020-04-12 ENCOUNTER — Encounter (INDEPENDENT_AMBULATORY_CARE_PROVIDER_SITE_OTHER): Payer: Self-pay | Admitting: Family Medicine

## 2020-04-12 VITALS — BP 113/70 | HR 83 | Temp 98.0°F | Ht 67.0 in | Wt 186.0 lb

## 2020-04-12 DIAGNOSIS — Z6830 Body mass index (BMI) 30.0-30.9, adult: Secondary | ICD-10-CM | POA: Diagnosis not present

## 2020-04-12 DIAGNOSIS — E669 Obesity, unspecified: Secondary | ICD-10-CM | POA: Diagnosis not present

## 2020-04-12 DIAGNOSIS — E786 Lipoprotein deficiency: Secondary | ICD-10-CM | POA: Diagnosis not present

## 2020-04-12 DIAGNOSIS — E7849 Other hyperlipidemia: Secondary | ICD-10-CM | POA: Diagnosis not present

## 2020-04-12 DIAGNOSIS — R7303 Prediabetes: Secondary | ICD-10-CM

## 2020-04-12 DIAGNOSIS — E559 Vitamin D deficiency, unspecified: Secondary | ICD-10-CM | POA: Diagnosis not present

## 2020-04-12 MED ORDER — VITAMIN D (ERGOCALCIFEROL) 1.25 MG (50000 UNIT) PO CAPS: 50000.0000 [IU] | ORAL_CAPSULE | ORAL | 0 refills | Status: DC

## 2020-04-13 NOTE — Progress Notes (Signed)
Chief Complaint:   OBESITY Donald Pope is here to discuss his progress with his obesity treatment plan along with follow-up of his obesity related diagnoses. Donald Pope is on the Category 2 Plan and states he is following his eating plan approximately 80% of the time. Donald Pope states he is walking and playing racquetball  45 minutes 3 times per week.  Today's visit was #: 3 Starting weight: 200 lbs Starting date: 03/09/2020 Today's weight: 186 lbs Today's date: 04/12/2020 Total lbs lost to date: 14 lbs Total lbs lost since last in-office visit: 3 lbs Total weight loss percentage to date: -7.00%  Interim History: Donald Pope is surprised that he did not gain weight. He ate traditional Thanksgiving meal with leftovers the next day and alcohol consumption. He did not change to Category 3 as recommended last office visit. Donald Pope states that he did not want make changes over the holidays and wants to stay on his current category 2 plan.  Assessment/Plan:   1. Pre-diabetes Donald Pope is not prescribed medication to aid in diabetes prevention. He has a diagnosis of prediabetes based on his elevated HgA1c and was informed this puts him at greater risk of developing diabetes. He continues to work on diet and exercise to decrease his risk of diabetes. He denies nausea or hypoglycemia.  Lab Results  Component Value Date   HGBA1C 6.2 (H) 03/09/2020   Lab Results  Component Value Date   INSULIN 35.6 (H) 03/09/2020   Plan: Good blood sugar control is important to decrease the likelihood of diabetic complications such as nephropathy, neuropathy, limb loss, blindness, coronary artery disease, and death. Intensive lifestyle modification including diet, exercise and weight loss are the first line of treatment for diabetes.   2. Other hyperlipidemia with TG Donald Pope is prescribed Crestor 5 mg. He has hyperlipidemia and has been trying to improve his cholesterol levels with intensive lifestyle modification including a  low saturated fat diet, exercise and weight loss. He denies any chest pain, claudication or myalgias.  Lab Results  Component Value Date   ALT 23 03/09/2020   AST 27 03/09/2020   ALKPHOS 84 03/09/2020   BILITOT 0.3 03/09/2020   Lab Results  Component Value Date   CHOL 152 03/09/2020   HDL 36 (L) 03/09/2020   LDLCALC 85 03/09/2020   LDLDIRECT 89.0 06/16/2017   TRIG 183 (H) 03/09/2020   CHOLHDL 4.2 03/09/2020   Plan: Continue current treatment as directed. Cardiovascular risk and specific lipid/LDL goals reviewed.  We discussed several lifestyle modifications today and Cris will continue to work on diet, exercise and weight loss efforts. Orders and follow up as documented in patient record.   Counseling Intensive lifestyle modifications are the first line treatment for this issue. . Dietary changes: Increase soluble fiber. Decrease simple carbohydrates. . Exercise changes: Moderate to vigorous-intensity aerobic activity 150 minutes per week if tolerated. . Lipid-lowering medications: see documented in medical record.   3. Low HDL (under 40) Donald Pope has hyperlipidemia and has been trying to improve his cholesterol levels with intensive lifestyle modification including a low saturated fat diet, exercise and weight loss. He denies any chest pain, claudication or myalgias.  Lab Results  Component Value Date   ALT 23 03/09/2020   AST 27 03/09/2020   ALKPHOS 84 03/09/2020   BILITOT 0.3 03/09/2020   Lab Results  Component Value Date   CHOL 152 03/09/2020   HDL 36 (L) 03/09/2020   LDLCALC 85 03/09/2020   LDLDIRECT 89.0 06/16/2017   TRIG 183 (  H) 03/09/2020   CHOLHDL 4.2 03/09/2020    Plan: Educated patient on disease process. All of his questions were addressed. Recommend increasing exercise to a minimum of 150-300 minutes a week and increase intake of fish, if possible, as protein source.  4. Vitamin D deficiency Donald Pope was prescribed Vit D at last office visit. He denies  issues and is tolerating it well without side effects.  Plan: Refill Vit D for 1 month, as per below. Patient aware it is okay to discontinue OTC Vit D3 1,000 units.  - Vitamin D, Ergocalciferol, (DRISDOL) 1.25 MG (50000 UNIT) CAPS capsule; Take 1 capsule (50,000 Units total) by mouth every 7 (seven) days.  Dispense: 4 capsule; Refill: 0  5. Class 1 obesity with serious comorbidity and body mass index (BMI) of 30.0 to 30.9 in adult, unspecified obesity type Donald Pope is currently in the action stage of change. As such, his goal is to continue with weight loss efforts. He has agreed to the Category 2 Plan (Category 3 recommended by provider, but patient declines) .   Exercise goals: Increase to 5 days a week of exercise  Behavioral modification strategies: meal planning and cooking strategies, keeping healthy foods in the home, celebration eating strategies and planning for success.  Donald Pope has agreed to follow-up with our clinic in 2-3 weeks. He was informed of the importance of frequent follow-up visits to maximize his success with intensive lifestyle modifications for his multiple health conditions.   Objective:   Blood pressure 113/70, pulse 83, temperature 98 F (36.7 C), height 5\' 7"  (1.702 m), weight 186 lb (84.4 kg), SpO2 98 %. Body mass index is 29.13 kg/m.  General: Cooperative, alert, well developed, in no acute distress. HEENT: Conjunctivae and lids unremarkable. Cardiovascular: Regular rhythm.  Lungs: Normal work of breathing. Neurologic: No focal deficits.   Lab Results  Component Value Date   CREATININE 1.06 03/09/2020   BUN 17 03/09/2020   NA 139 03/09/2020   K 4.5 03/09/2020   CL 101 03/09/2020   CO2 23 03/09/2020   Lab Results  Component Value Date   ALT 23 03/09/2020   AST 27 03/09/2020   ALKPHOS 84 03/09/2020   BILITOT 0.3 03/09/2020   Lab Results  Component Value Date   HGBA1C 6.2 (H) 03/09/2020   HGBA1C 5.8 (H) 11/27/2017   Lab Results  Component  Value Date   INSULIN 35.6 (H) 03/09/2020   Lab Results  Component Value Date   TSH 2.510 03/09/2020   Lab Results  Component Value Date   CHOL 152 03/09/2020   HDL 36 (L) 03/09/2020   LDLCALC 85 03/09/2020   LDLDIRECT 89.0 06/16/2017   TRIG 183 (H) 03/09/2020   CHOLHDL 4.2 03/09/2020   Lab Results  Component Value Date   WBC 6.4 03/09/2020   HGB 14.0 03/09/2020   HCT 40.9 03/09/2020   MCV 91 03/09/2020   PLT 126 (L) 03/09/2020   No results found for: IRON, TIBC, FERRITIN  Obesity Behavioral Intervention:   Approximately 15 minutes were spent on the discussion below.  ASK: We discussed the diagnosis of obesity with Donald Pope today and Donald Pope agreed to give [place] permission to discuss obesity behavioral modification therapy today.  ASSESS: Donald Pope has the diagnosis of obesity and his BMI today is 29.3. Donald Pope is in the action stage of change.   ADVISE: Donald Pope was educated on the multiple health risks of obesity as well as the benefit of weight loss to improve his health. He was advised of  the need for long term treatment and the importance of lifestyle modifications to improve his current health and to decrease his risk of future health problems.  AGREE: Multiple dietary modification options and treatment options were discussed and Donald Pope agreed to follow the recommendations documented in the above note.  ARRANGE: Donald Pope was educated on the importance of frequent visits to treat obesity as outlined per CMS and USPSTF guidelines and agreed to schedule his next follow up appointment today.  Attestation Statements:   Reviewed by clinician on day of visit: allergies, medications, problem list, medical history, surgical history, family history, social history, and previous encounter notes.   Coral Ceo, am acting as Location manager for [employer], DO.  I have reviewed the above documentation for accuracy and completeness, and I agree with the above. Marjory Sneddon, D.O.  The [place] was signed into law in 2016 which includes the topic of electronic health records.  This provides immediate access to information in MyChart.  This includes consultation notes, operative notes, office notes, lab results and pathology reports.  If you have any questions about what you read please let us know at your next visit so we can discuss your concerns and take corrective action if need be.  We are right here with you.

## 2020-04-26 ENCOUNTER — Other Ambulatory Visit: Payer: Self-pay

## 2020-04-26 ENCOUNTER — Encounter (INDEPENDENT_AMBULATORY_CARE_PROVIDER_SITE_OTHER): Payer: Self-pay | Admitting: Family Medicine

## 2020-04-26 ENCOUNTER — Ambulatory Visit (INDEPENDENT_AMBULATORY_CARE_PROVIDER_SITE_OTHER): Payer: PPO | Admitting: Family Medicine

## 2020-04-26 VITALS — BP 118/74 | HR 79 | Temp 98.0°F | Ht 67.0 in | Wt 183.0 lb

## 2020-04-26 DIAGNOSIS — E669 Obesity, unspecified: Secondary | ICD-10-CM

## 2020-04-26 DIAGNOSIS — E559 Vitamin D deficiency, unspecified: Secondary | ICD-10-CM | POA: Diagnosis not present

## 2020-04-26 DIAGNOSIS — Z6830 Body mass index (BMI) 30.0-30.9, adult: Secondary | ICD-10-CM | POA: Diagnosis not present

## 2020-04-26 MED ORDER — VITAMIN D (ERGOCALCIFEROL) 1.25 MG (50000 UNIT) PO CAPS: 50000.0000 [IU] | ORAL_CAPSULE | ORAL | 0 refills | Status: DC

## 2020-05-01 NOTE — Progress Notes (Signed)
Chief Complaint:   OBESITY Per is here to discuss his progress with his obesity treatment plan along with follow-up of his obesity related diagnoses. Helios is on the Category 2 Plan and states he is following his eating plan approximately 80% of the time. Ysmael states he is playing pickle ball, doing yoga, and walking 45-60 minutes 5 times per week.  Today's visit was #: 4 Starting weight: 200 lbs Starting date: 03/09/2020 Today's weight: 183 lbs Today's date: 04/26/2020 Total lbs lost to date: 17 lbs Total lbs lost since last in-office visit: 3 lbs Total weight loss percentage to date: -8.50%  Interim History: Les is doing great. He is following the plan but notes that he did more celebration eating the last couple of weeks and he's surprised he lost weight. He is still exercising the same amount without changes.  Assessment/Plan:   1. Vitamin D deficiency Ivor's Vitamin D level was 33.2 on 03/09/2020. He is currently taking prescription vitamin D 50,000 IU each week. He denies nausea, vomiting or muscle weakness.   Ref. Range 03/09/2020 10:57  Vitamin D, 25-Hydroxy Latest Ref Range: 30.0 - 100.0 ng/mL 33.2   Plan: Refill Vit D for 1 month, as per below. Low Vitamin D level contributes to fatigue and are associated with obesity, breast, and colon cancer. He agrees to continue to take prescription Vitamin D @50 ,000 IU every week and will follow-up for routine testing of Vitamin D, at least 2-3 times per year to avoid over-replacement.  Refill- Vitamin D, Ergocalciferol, (DRISDOL) 1.25 MG (50000 UNIT) CAPS capsule; Take 1 capsule (50,000 Units total) by mouth every 7 (seven) days.  Dispense: 4 capsule; Refill: 0  2. Class 1 obesity with serious comorbidity and body mass index (BMI) of 30.0 to 30.9 in adult, unspecified obesity type Thorne is currently in the action stage of change. As such, his goal is to continue with weight loss efforts. He has agreed to the Category 2  Plan.   Exercise goals: As is  Behavioral modification strategies: increasing water intake, travel eating strategies, celebration eating strategies and planning for success.  Taegan has agreed to follow-up with our clinic in 3 weeks. He was informed of the importance of frequent follow-up visits to maximize his success with intensive lifestyle modifications for his multiple health conditions.   Objective:   Blood pressure 118/74, pulse 79, temperature 98 F (36.7 C), height 5\' 7"  (1.702 m), weight 183 lb (83 kg), SpO2 97 %. Body mass index is 28.66 kg/m.  General: Cooperative, alert, well developed, in no acute distress. HEENT: Conjunctivae and lids unremarkable. Cardiovascular: Regular rhythm.  Lungs: Normal work of breathing. Neurologic: No focal deficits.   Lab Results  Component Value Date   CREATININE 1.06 03/09/2020   BUN 17 03/09/2020   NA 139 03/09/2020   K 4.5 03/09/2020   CL 101 03/09/2020   CO2 23 03/09/2020   Lab Results  Component Value Date   ALT 23 03/09/2020   AST 27 03/09/2020   ALKPHOS 84 03/09/2020   BILITOT 0.3 03/09/2020   Lab Results  Component Value Date   HGBA1C 6.2 (H) 03/09/2020   HGBA1C 5.8 (H) 11/27/2017   Lab Results  Component Value Date   INSULIN 35.6 (H) 03/09/2020   Lab Results  Component Value Date   TSH 2.510 03/09/2020   Lab Results  Component Value Date   CHOL 152 03/09/2020   HDL 36 (L) 03/09/2020   LDLCALC 85 03/09/2020   LDLDIRECT  89.0 06/16/2017   TRIG 183 (H) 03/09/2020   CHOLHDL 4.2 03/09/2020   Lab Results  Component Value Date   WBC 6.4 03/09/2020   HGB 14.0 03/09/2020   HCT 40.9 03/09/2020   MCV 91 03/09/2020   PLT 126 (L) 03/09/2020     Obesity Behavioral Intervention:   Approximately 15 minutes were spent on the discussion below.  ASK: We discussed the diagnosis of obesity with Herbie [place] today and Jahzier agreed to give [place] permission to discuss obesity behavioral modification therapy  today.  ASSESS: Lucas has the diagnosis of obesity and his BMI today is 28.7. Jodey is in the action stage of change.   ADVISE: Jefte was educated on the multiple health risks of obesity as well as the benefit of weight loss to improve his health. He was advised of the need for long term treatment and the importance of lifestyle modifications to improve his current health and to decrease his risk of future health problems.  AGREE: Multiple dietary modification options and treatment options were discussed and Trampus agreed to follow the recommendations documented in the above note.  ARRANGE: Jaun was educated on the importance of frequent visits to treat obesity as outlined per CMS and USPSTF guidelines and agreed to schedule his next follow up appointment today.  Attestation Statements:   Reviewed by clinician on day of visit: allergies, medications, problem list, medical history, surgical history, family history, social history, and previous encounter notes.  Coral Ceo, am acting as Location manager for [employer], DO.  I have reviewed the above documentation for accuracy and completeness, and I agree with the above. Marjory Sneddon, D.O.  The [place] was signed into law in 2016 which includes the topic of electronic health records.  This provides immediate access to information in MyChart.  This includes consultation notes, operative notes, office notes, lab results and pathology reports.  If you have any questions about what you read please let us know at your next visit so we can discuss your concerns and take corrective action if need be.  We are right here with you.

## 2020-05-22 ENCOUNTER — Other Ambulatory Visit: Payer: Self-pay

## 2020-05-22 ENCOUNTER — Ambulatory Visit (INDEPENDENT_AMBULATORY_CARE_PROVIDER_SITE_OTHER): Payer: PPO | Admitting: Family Medicine

## 2020-05-22 ENCOUNTER — Encounter (INDEPENDENT_AMBULATORY_CARE_PROVIDER_SITE_OTHER): Payer: Self-pay | Admitting: Family Medicine

## 2020-05-22 VITALS — BP 134/78 | HR 81 | Temp 98.0°F | Ht 67.0 in | Wt 185.0 lb

## 2020-05-22 DIAGNOSIS — M5430 Sciatica, unspecified side: Secondary | ICD-10-CM | POA: Diagnosis not present

## 2020-05-22 DIAGNOSIS — E559 Vitamin D deficiency, unspecified: Secondary | ICD-10-CM

## 2020-05-22 DIAGNOSIS — Z6830 Body mass index (BMI) 30.0-30.9, adult: Secondary | ICD-10-CM

## 2020-05-22 DIAGNOSIS — E669 Obesity, unspecified: Secondary | ICD-10-CM | POA: Diagnosis not present

## 2020-05-22 DIAGNOSIS — Z9189 Other specified personal risk factors, not elsewhere classified: Secondary | ICD-10-CM | POA: Diagnosis not present

## 2020-05-22 MED ORDER — VITAMIN D (ERGOCALCIFEROL) 1.25 MG (50000 UNIT) PO CAPS: 50000.0000 [IU] | ORAL_CAPSULE | ORAL | 0 refills | Status: DC

## 2020-05-24 NOTE — Progress Notes (Signed)
Chief Complaint:   OBESITY Donald Pope is here to discuss his progress with his obesity treatment plan along with follow-up of his obesity related diagnoses. Donald Pope is on the Category 2 Plan and states he is following his eating plan approximately 60% of the time. Donald Pope states he is walking 45 minutes 4 days a week and playing pickle ball 2 hours 2 times per week.  Today's visit was #: 5 Starting weight: 200 lbs Starting date: 03/09/2020 Today's weight: 185 lbs Today's date: 05/22/2020 Total lbs lost to date: 15 lbs Total lbs lost since last in-office visit: +2 lbs Total weight loss percentage to date: -7.50%  Interim History: Donald Pope traveled to his son's house in [place] for the holidays.  He did a lot of eating out and off-plan eating.  He hasn't been exercising as much.  He reports having sciatica symptoms, as well.   Assessment/Plan:   1. Vitamin D deficiency Ada's Vitamin D level was 33.2 on 03/09/2020. He is currently taking prescription vitamin D 50,000 IU each week. He denies nausea, vomiting or muscle weakness.  Ref. Range 03/09/2020 10:57  Vitamin D, 25-Hydroxy Latest Ref Range: 30.0 - 100.0 ng/mL 33.2   Plan: Refill Vit D for 1 month, as per below. Low Vitamin D level contributes to fatigue and are associated with obesity, breast, and colon cancer. He agrees to continue to take prescription Vitamin D @50 ,000 IU every week and will follow-up for routine testing of Vitamin D, at least 2-3 times per year to avoid over-replacement.  Refill- Vitamin D, Ergocalciferol, (DRISDOL) 1.25 MG (50000 UNIT) CAPS capsule; Take 1 capsule (50,000 Units total) by mouth every 7 (seven) days.  Dispense: 4 capsule; Refill: 0   2. Sciatica without back pain, unspecified laterality Donald Pope reports right sided buttocks and leg pain that is significantly hindering his ability to exercise.   Plan: I recommend patient speak with PCP about eval and treatment.  Possibly a referral to [facility]  certified physical therapy and also consider interventional pain management referral if warranted   3. At risk for activity intolerance Donald Pope was given approximately 10 minutes of exercise intolerance counseling today. He is 75 y.o. male and has risk factors exercise intolerance including obesity. We discussed intensive lifestyle modifications today with an emphasis on specific weight loss instructions and strategies. Donald Pope will slowly increase activity as tolerated.  4. Class 1 obesity with serious comorbidity and body mass index (BMI) of 30.0 to 30.9 in adult, unspecified obesity type Donald Pope is currently in the action stage of change. As such, his goal is to continue with weight loss efforts. He has agreed to the Category 2 Plan.   Exercise goals: As tolerated and directed by physical therapist  Behavioral modification strategies: meal planning and cooking strategies, keeping healthy foods in the home and planning for success.  Donald Pope has agreed to follow-up with our clinic in 2-3 weeks. He was informed of the importance of frequent follow-up visits to maximize his success with intensive lifestyle modifications for his multiple health conditions.   Objective:   Blood pressure 134/78, pulse 81, temperature 98 F (36.7 C), height 5\' 7"  (1.702 m), weight 185 lb (83.9 kg), SpO2 97 %. Body mass index is 28.98 kg/m.  General: Cooperative, alert, well developed, in no acute distress. HEENT: Conjunctivae and lids unremarkable. Cardiovascular: Regular rhythm.  Lungs: Normal work of breathing. Neurologic: No focal deficits.   Lab Results  Component Value Date   CREATININE 1.06 03/09/2020   BUN 17 03/09/2020  NA 139 03/09/2020   K 4.5 03/09/2020   CL 101 03/09/2020   CO2 23 03/09/2020   Lab Results  Component Value Date   ALT 23 03/09/2020   AST 27 03/09/2020   ALKPHOS 84 03/09/2020   BILITOT 0.3 03/09/2020   Lab Results  Component Value Date   HGBA1C 6.2 (H) 03/09/2020    HGBA1C 5.8 (H) 11/27/2017   Lab Results  Component Value Date   INSULIN 35.6 (H) 03/09/2020   Lab Results  Component Value Date   TSH 2.510 03/09/2020   Lab Results  Component Value Date   CHOL 152 03/09/2020   HDL 36 (L) 03/09/2020   LDLCALC 85 03/09/2020   LDLDIRECT 89.0 06/16/2017   TRIG 183 (H) 03/09/2020   CHOLHDL 4.2 03/09/2020   Lab Results  Component Value Date   WBC 6.4 03/09/2020   HGB 14.0 03/09/2020   HCT 40.9 03/09/2020   MCV 91 03/09/2020   PLT 126 (L) 03/09/2020    Attestation Statements:   Reviewed by clinician on day of visit: allergies, medications, problem list, medical history, surgical history, family history, social history, and previous encounter notes.  Coral Ceo, am acting as Location manager for [employer], DO.  I have reviewed the above documentation for accuracy and completeness, and I agree with the above. Marjory Sneddon, D.O.  The [place] was signed into law in 2016 which includes the topic of electronic health records.  This provides immediate access to information in MyChart.  This includes consultation notes, operative notes, office notes, lab results and pathology reports.  If you have any questions about what you read please let us know at your next visit so we can discuss your concerns and take corrective action if need be.  We are right here with you.

## 2020-05-25 ENCOUNTER — Other Ambulatory Visit: Payer: Self-pay

## 2020-05-26 ENCOUNTER — Other Ambulatory Visit: Payer: Self-pay

## 2020-05-26 ENCOUNTER — Encounter: Payer: Self-pay | Admitting: Family Medicine

## 2020-05-26 ENCOUNTER — Ambulatory Visit (INDEPENDENT_AMBULATORY_CARE_PROVIDER_SITE_OTHER): Payer: PPO | Admitting: Family Medicine

## 2020-05-26 VITALS — BP 112/70 | HR 92 | Temp 98.3°F | Resp 16 | Ht 67.0 in | Wt 189.0 lb

## 2020-05-26 DIAGNOSIS — M5431 Sciatica, right side: Secondary | ICD-10-CM | POA: Diagnosis not present

## 2020-05-26 MED ORDER — PREDNISONE 10 MG PO TABS: ORAL_TABLET | ORAL | 0 refills | Status: DC

## 2020-05-26 NOTE — Patient Instructions (Signed)

## 2020-05-26 NOTE — Progress Notes (Signed)
Established Patient Office Visit  Subjective:  Patient ID: Donald Pope, male    DOB: [birth year]  Age: 75 y.o. MRN: [id number]  CC:  Chief Complaint  Patient presents with  . Referral    Severe sciatica     HPI Donald Pope presents for right-sided sciatica symptoms.  He states for at least a month he has had fairly sharp pains which are radiating from his right buttock all the way down to the foot at times.  Questionable weakness.  Pain is actually worse sometimes when he is lying supine.  He had back surgery back in 2015 for PLIF L4-5.  He did relatively well afterwards.  He talked to his daughter-in-law who is a physical therapist at [facility] and she had suggested that he see someone with physical medicine.  He has been taking Advil up to 3 every 4-5 hours.  Provides only minimal relief.  He does take gabapentin regularly twice daily.  No urine or stool incontinence.  Past Medical History:  Diagnosis Date  . Allergy   . Anxiety   . Back pain   . Benign prostatic hypertrophy    possible  . Bilateral leg cramps   . CAD, NATIVE VESSEL 10/23/2009  . Chest pain   . DEPRESSION 09/13/2008  . Depression   . False positive stress test    history of, suggestive of inferior ischemia  . GERD 09/13/2008  . HYPERLIPIDEMIA 09/13/2008  . Meniere's disease   . OBSTRUCTIVE SLEEP APNEA 10/23/2009   cpap  . RBBB 10/26/2009   [place]  . Sciatic nerve pain   . Sleep apnea    Cpap  . SOB (shortness of breath)   . Spinal stenosis of lumbar region 08/18/2012  . THROMBOCYTOPENIA 09/13/2008    Past Surgical History:  Procedure Laterality Date  . CARDIAC CATHETERIZATION     10 yrs ago  . COLONOSCOPY  06/27/2016  . MAXIMUM ACCESS (MAS)POSTERIOR LUMBAR INTERBODY FUSION (PLIF) 1 LEVEL N/A 05/27/2013   Procedure: FOR MAXIMUM ACCESS (MAS) POSTERIOR LUMBAR INTERBODY FUSION (PLIF) 1 LEVEL ,LUMBAR FOUR-FIVE;  Surgeon: Eustace Naramore, MD;  Location: [facility];  Service: Neurosurgery;  Laterality:  N/A;  FOR MAXIMUM ACCESS (MAS) POSTERIOR LUMBAR INTERBODY FUSION (PLIF) 1 LEVEL ,LUMBAR FOUR-FIVE  . NASAL POLYP EXCISION    . TONSILLECTOMY      Family History  Problem Relation Age of Onset  . Heart disease Mother 55       CHF  . Heart disease Father   . COPD Father   . Colon polyps Father   . Alcoholism Father   . Hypertension Sister   . Hyperlipidemia Sister   . Stomach cancer Neg Hx   . Colon cancer Neg Hx   . Esophageal cancer Neg Hx   . Ulcerative colitis Neg Hx     Social History   Socioeconomic History  . Marital status: Married    Spouse name: Kaylin Schellenberg  . Number of children: 1  . Years of education: 50   . Highest education level: Bachelor's degree (e.g., BA, AB, BS)  Occupational History  . Occupation: retired  Tobacco Use  . Smoking status: Never Smoker  . Smokeless tobacco: Never Used  Vaping Use  . Vaping Use: Never used  Substance and Sexual Activity  . Alcohol use: Yes    Alcohol/week: 1.0 standard drink    Types: 1 Glasses of wine per week    Comment: 4-5 times a week  . Drug  use: No  . Sexual activity: Not Currently  Other Topics Concern  . Not on file  Social History Narrative   Lives with wife in [place] home. Son lives in [place], [place], 1 grandson.    Animal-lover, especially dogs.   Exercises routinely at [facility], enjoys yoga   Social Determinants of Health   Financial Resource Strain: Low Risk   . Difficulty of Paying Living Expenses: Not hard at all  Food Insecurity: No Food Insecurity  . Worried About Programme researcher, broadcasting/film/video in the Last Year: Never true  . Ran Out of Food in the Last Year: Never true  Transportation Needs: No Transportation Needs  . Lack of Transportation (Medical): No  . Lack of Transportation (Non-Medical): No  Physical Activity: Sufficiently Active  . Days of Exercise per Week: 7 days  . Minutes of Exercise per Session: 50 min  Stress: No Stress Concern Present  . Feeling of Stress : Only a little  Social  Connections: Socially Integrated  . Frequency of Communication with Friends and Family: More than three times a week  . Frequency of Social Gatherings with Friends and Family: Once a week  . Attends Religious Services: More than 4 times per year  . Active Member of Clubs or Organizations: No  . Attends Banker Meetings: More than 4 times per year  . Marital Status: Married  Catering manager Violence: Not on file    Outpatient Medications Prior to Visit  Medication Sig Dispense Refill  . aspirin 81 MG EC tablet Take 81 mg by mouth daily.    . cetirizine (ZYRTEC) 10 MG tablet Takes one tablet every other day.    . cholecalciferol (VITAMIN D) 1000 UNITS tablet Take 1,000 Units by mouth daily.     . citalopram (CELEXA) 10 MG tablet TAKE 1 TABLET BY MOUTH EVERY DAY 90 tablet 1  . Coenzyme Q10 (CO Q 10 PO) Take by mouth.    . DULoxetine (CYMBALTA) 30 MG capsule Take 30 mg by mouth at bedtime.    . fluticasone (FLONASE) 50 MCG/ACT nasal spray Place 2 sprays into both nostrils every other day.    . gabapentin (NEURONTIN) 300 MG capsule TAKE 2 CAPSULES BY MOUTH TWICE A DAY 360 capsule 1  . Magnesium 500 MG TABS Take by mouth.    . Melatonin 3 MG TABS Take 5 mg by mouth.     . Multiple Vitamin (MULTIVITAMIN) tablet Take 1 tablet by mouth daily.    Marland Kitchen omeprazole (PRILOSEC) 20 MG capsule Take 20 mg by mouth every other day.    . rosuvastatin (CRESTOR) 5 MG tablet Take 5 mg by mouth daily.    . Vitamin D, Ergocalciferol, (DRISDOL) 1.25 MG (50000 UNIT) CAPS capsule Take 1 capsule (50,000 Units total) by mouth every 7 (seven) days. 4 capsule 0   No facility-administered medications prior to visit.    Allergies  Allergen Reactions  . Adhesive [Tape] Rash  . Codeine Sulfate Other (See Comments)    urination problems  . Latex Rash    ROS Review of Systems  Constitutional: Negative for activity change, appetite change and fever.  Respiratory: Negative for cough and shortness of  breath.   Cardiovascular: Negative for chest pain and leg swelling.  Gastrointestinal: Negative for abdominal pain and vomiting.  Genitourinary: Negative for dysuria, flank pain and hematuria.  Musculoskeletal: Positive for back pain. Negative for joint swelling.  Neurological: Negative for weakness and numbness.      Objective:  Physical Exam Constitutional:      General: He is not in acute distress.    Appearance: He is well-developed and well-nourished.  Neck:     Thyroid: No thyromegaly.  Cardiovascular:     Rate and Rhythm: Normal rate and regular rhythm.     Heart sounds: Normal heart sounds. No murmur heard.   Pulmonary:     Effort: Pulmonary effort is normal. No respiratory distress.     Breath sounds: Normal breath sounds. No wheezing or rales.  Musculoskeletal:        General: No edema.  Skin:    Findings: No rash.  Neurological:     Mental Status: He is alert and oriented to person, place, and time.     Cranial Nerves: No cranial nerve deficit.     Deep Tendon Reflexes: Reflexes are normal and symmetric.     Comments: Has full strength with plantarflexion, dorsiflexion, knee extension, and hip flexion bilaterally.  Knee reflexes are 2+ bilaterally.  He has 2+ right ankle reflex and 1+ left     BP 112/70 (BP Location: Left Arm, Patient Position: Sitting, Cuff Size: Normal)   Pulse 92   Temp 98.3 F (36.8 C) (Oral)   Resp 16   Ht 5\' 7"  (1.702 m)   Wt 189 lb (85.7 kg)   SpO2 98%   BMI 29.60 kg/m  Wt Readings from Last 3 Encounters:  05/26/20 189 lb (85.7 kg)  05/22/20 185 lb (83.9 kg)  04/26/20 183 lb (83 kg)     Health Maintenance Due  Topic Date Due  . TETANUS/TDAP  09/21/2019    There are no preventive care reminders to display for this patient.  Lab Results  Component Value Date   TSH 2.510 03/09/2020   Lab Results  Component Value Date   WBC 6.4 03/09/2020   HGB 14.0 03/09/2020   HCT 40.9 03/09/2020   MCV 91 03/09/2020   PLT 126  (L) 03/09/2020   Lab Results  Component Value Date   NA 139 03/09/2020   K 4.5 03/09/2020   CO2 23 03/09/2020   GLUCOSE 113 (H) 03/09/2020   BUN 17 03/09/2020   CREATININE 1.06 03/09/2020   BILITOT 0.3 03/09/2020   ALKPHOS 84 03/09/2020   AST 27 03/09/2020   ALT 23 03/09/2020   PROT 7.1 03/09/2020   ALBUMIN 4.5 03/09/2020   CALCIUM 9.2 03/09/2020   GFR 77.98 06/16/2017   Lab Results  Component Value Date   CHOL 152 03/09/2020   Lab Results  Component Value Date   HDL 36 (L) 03/09/2020   Lab Results  Component Value Date   LDLCALC 85 03/09/2020   Lab Results  Component Value Date   TRIG 183 (H) 03/09/2020   Lab Results  Component Value Date   CHOLHDL 4.2 03/09/2020   Lab Results  Component Value Date   HGBA1C 6.2 (H) 03/09/2020      Assessment & Plan:   Several week history of right sciatica symptoms.  His daughter-in-law who is a physical therapist gave him some stretches for piriformis syndrome.  These have not helped thus far.  We discussed brief prednisone taper.  Continue gabapentin.  Per his request we will set up referral to [facility] He was cautioned about regular use of NSAIDS- especially at his age.  Meds ordered this encounter  Medications  . predniSONE (DELTASONE) 10 MG tablet    Sig: Taper as follows: 4-4-4-3-3-2-2-1-1    Dispense:  24 tablet    Refill:  0    Follow-up: No follow-ups on file.    Carolann Littler, MD

## 2020-06-05 ENCOUNTER — Encounter: Payer: Self-pay | Admitting: Physical Medicine & Rehabilitation

## 2020-06-05 ENCOUNTER — Encounter (INDEPENDENT_AMBULATORY_CARE_PROVIDER_SITE_OTHER): Payer: Self-pay | Admitting: Family Medicine

## 2020-06-05 ENCOUNTER — Ambulatory Visit (INDEPENDENT_AMBULATORY_CARE_PROVIDER_SITE_OTHER): Payer: PPO | Admitting: Family Medicine

## 2020-06-05 ENCOUNTER — Other Ambulatory Visit: Payer: Self-pay

## 2020-06-05 VITALS — BP 121/75 | HR 70 | Temp 97.9°F | Ht 67.0 in | Wt 178.0 lb

## 2020-06-05 DIAGNOSIS — E7849 Other hyperlipidemia: Secondary | ICD-10-CM

## 2020-06-05 DIAGNOSIS — E669 Obesity, unspecified: Secondary | ICD-10-CM | POA: Diagnosis not present

## 2020-06-05 DIAGNOSIS — R7303 Prediabetes: Secondary | ICD-10-CM | POA: Diagnosis not present

## 2020-06-05 DIAGNOSIS — Z6830 Body mass index (BMI) 30.0-30.9, adult: Secondary | ICD-10-CM | POA: Diagnosis not present

## 2020-06-05 DIAGNOSIS — E559 Vitamin D deficiency, unspecified: Secondary | ICD-10-CM | POA: Diagnosis not present

## 2020-06-05 DIAGNOSIS — Z9189 Other specified personal risk factors, not elsewhere classified: Secondary | ICD-10-CM | POA: Insufficient documentation

## 2020-06-05 DIAGNOSIS — E8881 Metabolic syndrome: Secondary | ICD-10-CM | POA: Insufficient documentation

## 2020-06-05 DIAGNOSIS — M5430 Sciatica, unspecified side: Secondary | ICD-10-CM | POA: Insufficient documentation

## 2020-06-05 MED ORDER — VITAMIN D (ERGOCALCIFEROL) 1.25 MG (50000 UNIT) PO CAPS: 50000.0000 [IU] | ORAL_CAPSULE | ORAL | 0 refills | Status: DC

## 2020-06-06 NOTE — Progress Notes (Signed)
Chief Complaint:   OBESITY Donald Pope is here to discuss his progress with his obesity treatment plan along with follow-up of his obesity related diagnoses. Donald Pope is on the Category 2 Plan and states he is following his eating plan approximately 85% of the time. Donald Pope states he is walking and playing pickle ball 45 minutes 2 times per week.  Today's visit was #: 6 Starting weight: 200 lbs Starting date: 03/09/2020 Today's weight: 178 lbs Today's date: 06/05/2020 Total lbs lost to date: 22 lbs Total lbs lost since last in-office visit: 7 lbs Total weight loss percentage to date: -11.00%  Interim History: Pt hasn't been exercising like usual due to his sciatica. He is getting close to his goal of 175 lbs, which I agree with.   Plan: Discussed adding in additional fruits and vegetables and try to maintain as a focus, rather than focusing on losing.  Assessment/Plan:   Meds ordered this encounter  Medications  . Vitamin D, Ergocalciferol, (DRISDOL) 1.25 MG (50000 UNIT) CAPS capsule    Sig: Take 1 capsule (50,000 Units total) by mouth every 7 (seven) days.    Dispense:  4 capsule    Refill:  0   Orders Placed This Encounter  Procedures  . Hemoglobin A1c  . Insulin, random  . Lipid panel  . VITAMIN D 25 Hydroxy (Vit-D Deficiency, Fractures)    1. Sciatic pain, unspecified laterality Pt reports sciatica has been a little better but still not exercising. PCP gave pt prednisone taper starting 05/26/2020.  Plan: Pt will call PM&R for follow up visit to evaluate condition further. Exercise as tolerated as directed by PCP or PMR.  2. Pre-diabetes Donald Pope has a diagnosis of prediabetes based on his elevated HgA1c and was informed this puts him at greater risk of developing diabetes. He continues to work on diet and exercise to decrease his risk of diabetes. He denies nausea or hypoglycemia. He is not on meds.  Lab Results  Component Value Date   HGBA1C 6.2 (H) 03/09/2020   Lab  Results  Component Value Date   INSULIN 35.6 (H) 03/09/2020   Plan: Check labs at next OV. Donald Pope will continue to work on weight loss, exercise, and decreasing simple carbohydrates to help decrease the risk of diabetes.   Lab/Orders today or future: - Hemoglobin A1c - Insulin, random  3. Vitamin D deficiency Donald Pope's Vitamin D level was 33.2 on 03/09/2020. He is currently taking prescription vitamin D 50,000 IU each week. He denies nausea, vomiting or muscle weakness.   Ref. Range 03/09/2020 10:57  Vitamin D, 25-Hydroxy Latest Ref Range: 30.0 - 100.0 ng/mL 33.2   Plan: Refill Vit D for 1 month, as per below. Check labs at next OV. Low Vitamin D level contributes to fatigue and are associated with obesity, breast, and colon cancer. He agrees to continue to take prescription Vitamin D @50 ,000 IU every week and will follow-up for routine testing of Vitamin D, at least 2-3 times per year to avoid over-replacement.  Refill- Vitamin D, Ergocalciferol, (DRISDOL) 1.25 MG (50000 UNIT) CAPS capsule; Take 1 capsule (50,000 Units total) by mouth every 7 (seven) days.  Dispense: 4 capsule; Refill: 0  Lab/Orders today or future: - VITAMIN D 25 Hydroxy (Vit-D Deficiency, Fractures)  4. Other hyperlipidemia Donald Pope has hyperlipidemia and has been trying to improve his cholesterol levels with intensive lifestyle modification including a low saturated fat diet, exercise and weight loss. He denies any chest pain, claudication or myalgias. Pt is on  Crestor.  Lab Results  Component Value Date   ALT 23 03/09/2020   AST 27 03/09/2020   ALKPHOS 84 03/09/2020   BILITOT 0.3 03/09/2020   Lab Results  Component Value Date   CHOL 152 03/09/2020   HDL 36 (L) 03/09/2020   LDLCALC 85 03/09/2020   LDLDIRECT 89.0 06/16/2017   TRIG 183 (H) 03/09/2020   CHOLHDL 4.2 03/09/2020   Plan: Check labs at next OV. Cardiovascular risk and specific lipid/LDL goals reviewed.  We discussed several lifestyle modifications  today and Donald Pope will continue to work on diet, exercise and weight loss efforts. Orders and follow up as documented in patient record.   Counseling Intensive lifestyle modifications are the first line treatment for this issue. . Dietary changes: Increase soluble fiber. Decrease simple carbohydrates. . Exercise changes: Moderate to vigorous-intensity aerobic activity 150 minutes per week if tolerated. . Lipid-lowering medications: see documented in medical record.  Lab/Orders today or future: - Lipid panel  5. At risk for activity intolerance Donald Pope was given approximately 9 minutes of exercise intolerance counseling today. He is 75 y.o. male and has risk factors exercise intolerance including obesity. We discussed intensive lifestyle modifications today with an emphasis on specific weight loss instructions and strategies. Donald Pope will slowly increase activity as tolerated.  6. Class 1 obesity with serious comorbidity and body mass index (BMI) of 30.0 to 30.9 in adult, unspecified obesity type Donald Pope is currently in the action stage of change. As such, his goal is to continue with weight loss efforts. He has agreed to the Category 2 Plan but with extra fruit and vegetable options since pt is near goal weight.   Exercise goals: Per treating providers regarding sciatica  Behavioral modification strategies: meal planning and cooking strategies, keeping healthy foods in the home and planning for success.  Donald Pope has agreed to follow-up with our clinic in 2-3 weeks. He was informed of the importance of frequent follow-up visits to maximize his success with intensive lifestyle modifications for his multiple health conditions.   Objective:   Blood pressure 121/75, pulse 70, temperature 97.9 F (36.6 C), height 5\' 7"  (1.702 m), weight 178 lb (80.7 kg), SpO2 99 %. Body mass index is 27.88 kg/m.  General: Cooperative, alert, well developed, in no acute distress. HEENT: Conjunctivae and lids  unremarkable. Cardiovascular: Regular rhythm.  Lungs: Normal work of breathing. Neurologic: No focal deficits.   Lab Results  Component Value Date   CREATININE 1.06 03/09/2020   BUN 17 03/09/2020   NA 139 03/09/2020   K 4.5 03/09/2020   CL 101 03/09/2020   CO2 23 03/09/2020   Lab Results  Component Value Date   ALT 23 03/09/2020   AST 27 03/09/2020   ALKPHOS 84 03/09/2020   BILITOT 0.3 03/09/2020   Lab Results  Component Value Date   HGBA1C 6.2 (H) 03/09/2020   HGBA1C 5.8 (H) 11/27/2017   Lab Results  Component Value Date   INSULIN 35.6 (H) 03/09/2020   Lab Results  Component Value Date   TSH 2.510 03/09/2020   Lab Results  Component Value Date   CHOL 152 03/09/2020   HDL 36 (L) 03/09/2020   LDLCALC 85 03/09/2020   LDLDIRECT 89.0 06/16/2017   TRIG 183 (H) 03/09/2020   CHOLHDL 4.2 03/09/2020   Lab Results  Component Value Date   WBC 6.4 03/09/2020   HGB 14.0 03/09/2020   HCT 40.9 03/09/2020   MCV 91 03/09/2020   PLT 126 (L) 03/09/2020    Attestation  Statements:   Reviewed by clinician on day of visit: allergies, medications, problem list, medical history, surgical history, family history, social history, and previous encounter notes.  Coral Ceo, am acting as Location manager for [employer], DO.  I have reviewed the above documentation for accuracy and completeness, and I agree with the above. Marjory Sneddon, D.O.  The [place] was signed into law in 2016 which includes the topic of electronic health records.  This provides immediate access to information in MyChart.  This includes consultation notes, operative notes, office notes, lab results and pathology reports.  If you have any questions about what you read please let us know at your next visit so we can discuss your concerns and take corrective action if need be.  We are right here with you.

## 2020-06-07 ENCOUNTER — Ambulatory Visit: Payer: Self-pay

## 2020-06-21 ENCOUNTER — Other Ambulatory Visit: Payer: Self-pay

## 2020-06-21 ENCOUNTER — Encounter (INDEPENDENT_AMBULATORY_CARE_PROVIDER_SITE_OTHER): Payer: Self-pay | Admitting: Family Medicine

## 2020-06-21 ENCOUNTER — Ambulatory Visit (INDEPENDENT_AMBULATORY_CARE_PROVIDER_SITE_OTHER): Payer: PPO | Admitting: Family Medicine

## 2020-06-21 VITALS — BP 119/73 | HR 76 | Temp 97.6°F | Ht 67.0 in | Wt 178.0 lb

## 2020-06-21 DIAGNOSIS — Z6830 Body mass index (BMI) 30.0-30.9, adult: Secondary | ICD-10-CM

## 2020-06-21 DIAGNOSIS — E559 Vitamin D deficiency, unspecified: Secondary | ICD-10-CM

## 2020-06-21 DIAGNOSIS — E7849 Other hyperlipidemia: Secondary | ICD-10-CM

## 2020-06-21 DIAGNOSIS — E669 Obesity, unspecified: Secondary | ICD-10-CM | POA: Diagnosis not present

## 2020-06-21 DIAGNOSIS — R7303 Prediabetes: Secondary | ICD-10-CM | POA: Diagnosis not present

## 2020-06-21 MED ORDER — VITAMIN D (ERGOCALCIFEROL) 1.25 MG (50000 UNIT) PO CAPS: 50000.0000 [IU] | ORAL_CAPSULE | ORAL | 0 refills | Status: DC

## 2020-06-22 LAB — LIPID PANEL
Chol/HDL Ratio: 3.4 ratio (ref 0.0–5.0)
Cholesterol, Total: 131 mg/dL (ref 100–199)
HDL: 39 mg/dL — ABNORMAL LOW (ref >39)
LDL Chol Calc (NIH): 72 mg/dL (ref 0–99)
Triglycerides: 109 mg/dL (ref 0–149)
VLDL Cholesterol Cal: 20 mg/dL (ref 5–40)

## 2020-06-22 LAB — HEMOGLOBIN A1C
Est. average glucose Bld gHb Est-mCnc: 120 mg/dL
Hgb A1c MFr Bld: 5.8 % — ABNORMAL HIGH (ref 4.8–5.6)

## 2020-06-22 LAB — INSULIN, RANDOM: INSULIN: 11.2 u[IU]/mL (ref 2.6–24.9)

## 2020-06-22 LAB — VITAMIN D 25 HYDROXY (VIT D DEFICIENCY, FRACTURES): Vit D, 25-Hydroxy: 56.7 ng/mL (ref 30.0–100.0)

## 2020-06-26 ENCOUNTER — Other Ambulatory Visit: Payer: Self-pay

## 2020-06-26 ENCOUNTER — Encounter: Payer: Self-pay | Admitting: Physical Medicine & Rehabilitation

## 2020-06-26 ENCOUNTER — Encounter: Payer: PPO | Attending: Physical Medicine & Rehabilitation | Admitting: Physical Medicine & Rehabilitation

## 2020-06-26 VITALS — BP 135/79 | HR 96 | Temp 98.9°F | Ht 67.0 in | Wt 183.0 lb

## 2020-06-26 DIAGNOSIS — G5701 Lesion of sciatic nerve, right lower limb: Secondary | ICD-10-CM | POA: Insufficient documentation

## 2020-06-26 DIAGNOSIS — G479 Sleep disorder, unspecified: Secondary | ICD-10-CM | POA: Insufficient documentation

## 2020-06-26 DIAGNOSIS — M5441 Lumbago with sciatica, right side: Secondary | ICD-10-CM | POA: Diagnosis not present

## 2020-06-26 DIAGNOSIS — R252 Cramp and spasm: Secondary | ICD-10-CM | POA: Diagnosis not present

## 2020-06-26 DIAGNOSIS — G8929 Other chronic pain: Secondary | ICD-10-CM | POA: Diagnosis not present

## 2020-06-26 MED ORDER — AMITRIPTYLINE HCL 10 MG PO TABS: 10.0000 mg | ORAL_TABLET | Freq: Every day | ORAL | 1 refills | Status: DC

## 2020-06-26 MED ORDER — METHOCARBAMOL 500 MG PO TABS: 500.0000 mg | ORAL_TABLET | Freq: Two times a day (BID) | ORAL | 1 refills | Status: DC | PRN

## 2020-06-26 NOTE — Progress Notes (Signed)
Subjective:    Patient ID: Donald Pope, male    DOB: 09-Nov-1945, 75 y.o.   MRN: [id number]  HPI Male with pmh/psh thrombocytopenia, OSA, meniere's (rarely), depression, CAD, BPH, anxiety, back pain, PLIF (L3-L4) presents with right back pain with radiation to RLE.  Started ~2012.  Denies inciting event.  Heat/Ice improve the pain.  Horizontal postures, ambulation exacerbate the pain. Tingling and achy.  Associated cramping. Constant. Associated weakness.  No numbness.  Pain radiates to lateral ankle. Stretching equivocal. Advil helps. Mild benefit with massage. He had surgery for similar symptoms. Denies falls. Main goal is to sleep.  Pain Inventory Average Pain 6 Pain Right Now 3 My pain is intermittent, constant, sharp, dull and aching  In the last 24 hours, has pain interfered with the following? General activity 2 Relation with others 2 Enjoyment of life 3 What TIME of day is your pain at its worst? night Sleep (in general) Fair  Pain is worse with: walking and inactivity Pain improves with: heat/ice, therapy/exercise and medication Relief from Meds: 4  walk without assistance how many minutes can you walk? 60 do you drive?  yes Do you have any goals in this area?  no  retired Do you have any goals in this area?  no  spasms  new  new    Family History  Problem Relation Age of Onset  . Heart disease Mother 73       CHF  . Heart disease Father   . COPD Father   . Colon polyps Father   . Alcoholism Father   . Hypertension Sister   . Hyperlipidemia Sister   . Stomach cancer Neg Hx   . Colon cancer Neg Hx   . Esophageal cancer Neg Hx   . Ulcerative colitis Neg Hx    Social History   Socioeconomic History  . Marital status: Married    Spouse name: Varun Jourdan  . Number of children: 1  . Years of education: 73   . Highest education level: Bachelor's degree (e.g., BA, AB, BS)  Occupational History  . Occupation: retired  Tobacco Use  . Smoking  status: Never Smoker  . Smokeless tobacco: Never Used  Vaping Use  . Vaping Use: Never used  Substance and Sexual Activity  . Alcohol use: Yes    Alcohol/week: 1.0 standard drink    Types: 1 Glasses of wine per week    Comment: 4-5 times a week  . Drug use: No  . Sexual activity: Not Currently  Other Topics Concern  . Not on file  Social History Narrative   Lives with wife in [place] home. Son lives in [place], [place], 1 grandson.    Animal-lover, especially dogs.   Exercises routinely at [facility], enjoys yoga   Social Determinants of Health   Financial Resource Strain: Not on file  Food Insecurity: Not on file  Transportation Needs: Not on file  Physical Activity: Not on file  Stress: Not on file  Social Connections: Not on file   Past Surgical History:  Procedure Laterality Date  . CARDIAC CATHETERIZATION     10 yrs ago  . COLONOSCOPY  06/27/2016  . MAXIMUM ACCESS (MAS)POSTERIOR LUMBAR INTERBODY FUSION (PLIF) 1 LEVEL N/A 05/27/2013   Procedure: FOR MAXIMUM ACCESS (MAS) POSTERIOR LUMBAR INTERBODY FUSION (PLIF) 1 LEVEL ,LUMBAR FOUR-FIVE;  Surgeon: Eustace Krone, MD;  Location: [facility];  Service: Neurosurgery;  Laterality: N/A;  FOR MAXIMUM ACCESS (MAS) POSTERIOR LUMBAR INTERBODY FUSION (PLIF) 1 LEVEL ,  LUMBAR FOUR-FIVE  . NASAL POLYP EXCISION    . TONSILLECTOMY     Past Medical History:  Diagnosis Date  . Allergy   . Anxiety   . Back pain   . Benign prostatic hypertrophy    possible  . Bilateral leg cramps   . CAD, NATIVE VESSEL 10/23/2009  . Chest pain   . DEPRESSION 09/13/2008  . Depression   . False positive stress test    history of, suggestive of inferior ischemia  . GERD 09/13/2008  . HYPERLIPIDEMIA 09/13/2008  . Meniere's disease   . OBSTRUCTIVE SLEEP APNEA 10/23/2009   cpap  . RBBB 10/26/2009   [place]  . Sciatic nerve pain   . Sleep apnea    Cpap  . SOB (shortness of breath)   . Spinal stenosis of lumbar region 08/18/2012  . THROMBOCYTOPENIA 09/13/2008   BP  135/79   Pulse 96   Temp 98.9 F (37.2 C)   Ht 5\' 7"  (1.702 m)   Wt 183 lb (83 kg)   SpO2 99%   BMI 28.66 kg/m   Opioid Risk Score:   Fall Risk Score:  `1  Depression screen PHQ 2/9  Depression screen [facility] 2/9 06/26/2020 03/09/2020 06/07/2019 06/01/2018 05/14/2017 03/29/2015  Decreased Interest 0 0 0 0 0 0  Down, Depressed, Hopeless 0 1 0 0 0 0  PHQ - 2 Score 0 1 0 0 0 0  Altered sleeping 1 0 - 1 - -  Tired, decreased energy 0 1 - 0 - -  Change in appetite 0 0 - 0 - -  Feeling bad or failure about yourself  0 1 - 0 - -  Trouble concentrating 0 1 - 0 - -  Moving slowly or fidgety/restless 0 0 - 0 - -  Suicidal thoughts 0 0 - 0 - -  PHQ-9 Score 1 4 - 1 - -  Difficult doing work/chores Not difficult at all Not difficult at all - - - -    Review of Systems  Constitutional: Negative.   HENT: Negative.   Eyes: Negative.   Respiratory: Negative.   Cardiovascular: Negative.   Endocrine: Negative.   Genitourinary: Negative.   Musculoskeletal: Positive for back pain, gait problem and myalgias.       Cramps, calves  Spasms  Skin: Negative.   Allergic/Immunologic: Negative.   Hematological: Negative.   Psychiatric/Behavioral: Positive for sleep disturbance.  All other systems reviewed and are negative.     Objective:   Physical Exam Constitutional: No distress . Vital signs reviewed. HENT: Normocephalic.  Atraumatic. Eyes: EOMI. No discharge. Cardiovascular: No JVD.   Respiratory: Normal effort.  No stridor.   GI: Non-distended.   Skin: Warm and dry.  Intact. Psych: Normal mood.  Normal behavior. Musc: No edema in extremities.   No tenderness in extremities. Mild + FABERS RLE, compression test Mild TTP at right greater trochanteric bursa Mild TTP R>L SI joint TTP right piriformis Negative axial grind test Neuro: Alert Motor: 5/5 b/l LE Sensation intact to light touch b/l LE Neg seated SLR bilaterally     Assessment & Plan:  Male with pmh/psh thrombocytopenia,  OSA, meniere's (rarely), depression, CAD, BPH, anxiety, back pain, PLIF (L3-L4) presents with right back pain with radiation to RLE.   1. 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 Sleep disturbance  See #1  Will order Elavil 10mg  qhs, educated on signs/symptoms of serotonin syndrome   3. Obesity  Continue follow up with weight loss clinic  4. Myalgia   See #1  Will consider trigger point injections  5. Muscle cramping  Will order Mag level  Vit B12, relatively low, encouraged supplementation  Not associated with statin as patient has tried off the medications  >60 minutes spent in reviewing labs, imaging, chart, as well as discussion regarding treatment options

## 2020-06-26 NOTE — Patient Instructions (Signed)
[email or web address]

## 2020-06-26 NOTE — Progress Notes (Signed)
Chief Complaint:   OBESITY Donald Pope is here to discuss his progress with his obesity treatment plan along with follow-up of his obesity related diagnoses.   Today's visit was #: 7 Starting weight: 200 lbs Starting date:  03/09/2020 Today's weight: 178 lbs Today's date: 06/21/2020 Total lbs lost to date: 22 lbs Body mass index is 27.88 kg/m.  Total weight loss percentage to date: -11.00%  Interim History: Tarence says he is still dealing with right-sided radiculopathy/sciatica and is going for an evaluation with [facility] Re[facility] later this week.  He has not been to PT yet.  Denies issues with the meal plan.  Nutrition Plan: Category 2 Plan with extra fruit and vegetable options for 75% of the time. Activity: Pickle ball for 90 minutes 2 times per week. This patient is following the prescribed meal plan meal without concerns.  Food recall appears to be consistent with the prescribed plan.  When following the plan, hunger and cravings are well controlled.    Assessment/Plan:   1. Prediabetes Not at goal. Goal is HgbA1c < 5.7.  Medication: None.    Plan:  Check A1c and FI today.  Discussed possible medication and he declines and wants to lose weight with prudent nutritional plan and exercise.  He will continue to focus on protein-rich, low simple carbohydrate foods. We reviewed the importance of hydration, regular exercise for stress reduction, and restorative sleep.   Lab Results  Component Value Date   HGBA1C 5.8 (H) 06/21/2020   Lab Results  Component Value Date   INSULIN 11.2 06/21/2020   INSULIN 35.6 (H) 03/09/2020   2. Other hyperlipidemia Course: Not at goal. Lipid-lowering medications: Crestor 5 mg daily.   Plan:  Will check FLP today.  Continue prudent nutritional plan and weight loss.  Dietary changes: Increase soluble fiber, decrease simple carbohydrates, decrease saturated fat. Exercise changes: Moderate to vigorous-intensity aerobic activity 150 minutes  per week or as tolerated. We will continue to monitor along with PCP/specialists as it pertains to his weight loss journey.  Lab Results  Component Value Date   CHOL 131 06/21/2020   HDL 39 (L) 06/21/2020   LDLCALC 72 06/21/2020   LDLDIRECT 89.0 06/16/2017   TRIG 109 06/21/2020   CHOLHDL 3.4 06/21/2020   Lab Results  Component Value Date   ALT 23 03/09/2020   AST 27 03/09/2020   ALKPHOS 84 03/09/2020   BILITOT 0.3 03/09/2020   The 10-year ASCVD risk score Mikey Bussing DC Jr., et al., 2013) is: 27.4%   Values used to calculate the score:     Age: 45 years     Sex: Male     Is Non-Hispanic African American: No     Diabetic: No     Tobacco smoker: Yes     Systolic Blood Pressure: 270 mmHg     Is BP treated: No     HDL Cholesterol: 39 mg/dL     Total Cholesterol: 131 mg/dL  3. Vitamin D deficiency Improving, but not optimized. Current vitamin D is 33.2, tested on 03/09/2020. Optimal goal > 50 ng/dL.   Plan: Continue to take prescription Vitamin D @50 ,000 IU every week as prescribed.  Will check vitamin D level today.  - Refill Vitamin D, Ergocalciferol, (DRISDOL) 1.25 MG (50000 UNIT) CAPS capsule; Take 1 capsule (50,000 Units total) by mouth every 7 (seven) days.  Dispense: 4 capsule; Refill: 0  4. Class 1 obesity with serious comorbidity and body mass index (BMI) of 30.0 to  30.9 in adult, unspecified obesity type  Course: Adell is currently in the action stage of change. As such, his goal is to continue with weight loss efforts.   Nutrition goals: He has agreed to the Category 2 Plan.   Exercise goals: Per Physical Medicine and Rehab.  Behavioral modification strategies: increasing water intake, keeping healthy foods in the home, ways to avoid boredom eating and planning for success.  Merlen has agreed to follow-up with our clinic in 2-3 weeks. He was informed of the importance of frequent follow-up visits to maximize his success with intensive lifestyle modifications for his  multiple health conditions.   Objective:   Blood pressure 119/73, pulse 76, temperature 97.6 F (36.4 C), height 5\' 7"  (1.702 m), weight 178 lb (80.7 kg), SpO2 97 %. Body mass index is 27.88 kg/m.  General: Cooperative, alert, well developed, in no acute distress. HEENT: Conjunctivae and lids unremarkable. Cardiovascular: Regular rhythm.  Lungs: Normal work of breathing. Neurologic: No focal deficits.   Lab Results  Component Value Date   CREATININE 1.06 03/09/2020   BUN 17 03/09/2020   NA 139 03/09/2020   K 4.5 03/09/2020   CL 101 03/09/2020   CO2 23 03/09/2020   Lab Results  Component Value Date   ALT 23 03/09/2020   AST 27 03/09/2020   ALKPHOS 84 03/09/2020   BILITOT 0.3 03/09/2020   Lab Results  Component Value Date   HGBA1C 5.8 (H) 06/21/2020   HGBA1C 6.2 (H) 03/09/2020   HGBA1C 5.8 (H) 11/27/2017   Lab Results  Component Value Date   INSULIN 11.2 06/21/2020   INSULIN 35.6 (H) 03/09/2020   Lab Results  Component Value Date   TSH 2.510 03/09/2020   Lab Results  Component Value Date   CHOL 131 06/21/2020   HDL 39 (L) 06/21/2020   LDLCALC 72 06/21/2020   LDLDIRECT 89.0 06/16/2017   TRIG 109 06/21/2020   CHOLHDL 3.4 06/21/2020   Lab Results  Component Value Date   WBC 6.4 03/09/2020   HGB 14.0 03/09/2020   HCT 40.9 03/09/2020   MCV 91 03/09/2020   PLT 126 (L) 03/09/2020   Obesity Behavioral Intervention:   Approximately 15 minutes were spent on the discussion below.  ASK: We discussed the diagnosis of obesity with Herbie [place] today and Connar agreed to give [place] permission to discuss obesity behavioral modification therapy today.  ASSESS: Fransico has the diagnosis of obesity and his BMI today is 28.0. Olly is in the action stage of change.  ADVISE: Bashar was educated on the multiple health risks of obesity as well as the benefit of weight loss to improve his health. He was advised of the need for long term treatment and the importance of lifestyle  modifications to improve his current health and to decrease his risk of future health problems.  AGREE: Multiple dietary modification options and treatment options were discussed and Sian agreed to follow the recommendations documented in the above note.  ARRANGE: Antoney was educated on the importance of frequent visits to treat obesity as outlined per CMS and USPSTF guidelines and agreed to schedule his next follow up appointment today.  Attestation Statements:   Reviewed by clinician on day of visit: allergies, medications, problem list, medical history, surgical history, family history, social history, and previous encounter notes.  I, Water quality scientist, CMA, am acting as Location manager for [employer], DO.  I have reviewed the above documentation for accuracy and completeness, and I agree with the above. Diego Cory  Dalton Molesworth, D.O.  The [place] was signed into law in 2016 which includes the topic of electronic health records.  This provides immediate access to information in MyChart.  This includes consultation notes, operative notes, office notes, lab results and pathology reports.  If you have any questions about what you read please let us know at your next visit so we can discuss your concerns and take corrective action if need be.  We are right here with you.

## 2020-06-28 ENCOUNTER — Ambulatory Visit (INDEPENDENT_AMBULATORY_CARE_PROVIDER_SITE_OTHER): Payer: PPO

## 2020-06-28 ENCOUNTER — Other Ambulatory Visit: Payer: Self-pay

## 2020-06-28 VITALS — BP 126/72 | HR 94 | Temp 98.4°F | Ht 67.0 in | Wt 182.2 lb

## 2020-06-28 DIAGNOSIS — Z0000 Encounter for general adult medical examination without abnormal findings: Secondary | ICD-10-CM

## 2020-06-28 NOTE — Progress Notes (Signed)
Subjective:   Donald Pope is a 75 y.o. male who presents for Medicare Annual/Subsequent preventive examination.  Review of Systems    N/A  Cardiac Risk Factors include: advanced age (>49men, >50 women);dyslipidemia;male gender     Objective:    Today's Vitals   06/28/20 0953 06/28/20 0955  BP: 126/72   Pulse: 94   Temp: 98.4 F (36.9 C)   TempSrc: Oral   SpO2: 96%   Weight: 182 lb 3 oz (82.6 kg)   Height: 5\' 7"  (1.702 m)   PainSc:  2    Body mass index is 28.53 kg/m.  Advanced Directives 06/28/2020 06/07/2019 06/01/2018 05/14/2017 06/10/2016 05/17/2013  Does Patient Have a Medical Advance Directive? Yes Yes Yes Yes Yes Patient has advance directive, copy in chart  Type of Advance Directive [place];Living will [place];Living will [place];Living will - [place];Living will -  Does patient want to make changes to medical advance directive? No - Patient declined No - Patient declined No - Patient declined - - -  Copy of [place] in Chart? No - copy requested No - copy requested No - copy requested - - -    Current Medications (verified) Outpatient Encounter Medications as of 06/28/2020  Medication Sig  . amitriptyline (ELAVIL) 10 MG tablet Take 1 tablet (10 mg total) by mouth at bedtime.  Marland Kitchen aspirin 81 MG EC tablet Take 81 mg by mouth daily.  . cetirizine (ZYRTEC) 10 MG tablet Takes one tablet every other day.  . cholecalciferol (VITAMIN D) 1000 UNITS tablet Take 1,000 Units by mouth daily.   . citalopram (CELEXA) 10 MG tablet TAKE 1 TABLET BY MOUTH EVERY DAY  . Coenzyme Q10 (CO Q 10 PO) Take by mouth.  . fluticasone (FLONASE) 50 MCG/ACT nasal spray Place 2 sprays into both nostrils every other day.  . gabapentin (NEURONTIN) 300 MG capsule TAKE 2 CAPSULES BY MOUTH TWICE A DAY  . Magnesium 500 MG TABS Take by mouth.  . Melatonin 3 MG TABS Take 5 mg by mouth.   . methocarbamol  (ROBAXIN) 500 MG tablet Take 1 tablet (500 mg total) by mouth 2 (two) times daily as needed for muscle spasms.  . Multiple Vitamin (MULTIVITAMIN) tablet Take 1 tablet by mouth daily.  Marland Kitchen omeprazole (PRILOSEC) 20 MG capsule Take 20 mg by mouth every other day.  . rosuvastatin (CRESTOR) 5 MG tablet Take 5 mg by mouth daily.  . Vitamin D, Ergocalciferol, (DRISDOL) 1.25 MG (50000 UNIT) CAPS capsule Take 1 capsule (50,000 Units total) by mouth every 7 (seven) days.   No facility-administered encounter medications on file as of 06/28/2020.    Allergies (verified) Adhesive [tape], Codeine sulfate, and Latex   History: Past Medical History:  Diagnosis Date  . Allergy   . Anxiety   . Back pain   . Benign prostatic hypertrophy    possible  . Bilateral leg cramps   . CAD, NATIVE VESSEL 10/23/2009  . Chest pain   . DEPRESSION 09/13/2008  . Depression   . False positive stress test    history of, suggestive of inferior ischemia  . GERD 09/13/2008  . HYPERLIPIDEMIA 09/13/2008  . Meniere's disease   . OBSTRUCTIVE SLEEP APNEA 10/23/2009   cpap  . RBBB 10/26/2009   [place]  . Sciatic nerve pain   . Sleep apnea    Cpap  . SOB (shortness of breath)   . Spinal stenosis of  lumbar region 08/18/2012  . THROMBOCYTOPENIA 09/13/2008   Past Surgical History:  Procedure Laterality Date  . CARDIAC CATHETERIZATION     10 yrs ago  . COLONOSCOPY  06/27/2016  . MAXIMUM ACCESS (MAS)POSTERIOR LUMBAR INTERBODY FUSION (PLIF) 1 LEVEL N/A 05/27/2013   Procedure: FOR MAXIMUM ACCESS (MAS) POSTERIOR LUMBAR INTERBODY FUSION (PLIF) 1 LEVEL ,LUMBAR FOUR-FIVE;  Surgeon: Eustace Humphres, MD;  Location: [facility];  Service: Neurosurgery;  Laterality: N/A;  FOR MAXIMUM ACCESS (MAS) POSTERIOR LUMBAR INTERBODY FUSION (PLIF) 1 LEVEL ,LUMBAR FOUR-FIVE  . NASAL POLYP EXCISION    . TONSILLECTOMY     Family History  Problem Relation Age of Onset  . Heart disease Mother 23       CHF  . Heart disease Father   . COPD Father   .  Colon polyps Father   . Alcoholism Father   . Hypertension Sister   . Hyperlipidemia Sister   . Stomach cancer Neg Hx   . Colon cancer Neg Hx   . Esophageal cancer Neg Hx   . Ulcerative colitis Neg Hx    Social History   Socioeconomic History  . Marital status: Married    Spouse name: Konnar Ben  . Number of children: 1  . Years of education: 23   . Highest education level: Bachelor's degree (e.g., BA, AB, BS)  Occupational History  . Occupation: retired  Tobacco Use  . Smoking status: Never Smoker  . Smokeless tobacco: Never Used  Vaping Use  . Vaping Use: Never used  Substance and Sexual Activity  . Alcohol use: Yes    Alcohol/week: 1.0 standard drink    Types: 1 Glasses of wine per week    Comment: 4-5 times a week  . Drug use: No  . Sexual activity: Not Currently  Other Topics Concern  . Not on file  Social History Narrative   Lives with wife in [place] home. Son lives in [place], [place], 1 grandson.    Animal-lover, especially dogs.   Exercises routinely at [facility], enjoys yoga   Social Determinants of Health   Financial Resource Strain: Low Risk   . Difficulty of Paying Living Expenses: Not hard at all  Food Insecurity: No Food Insecurity  . Worried About Charity fundraiser in the Last Year: Never true  . Ran Out of Food in the Last Year: Never true  Transportation Needs: No Transportation Needs  . Lack of Transportation (Medical): No  . Lack of Transportation (Non-Medical): No  Physical Activity: Sufficiently Active  . Days of Exercise per Week: 4 days  . Minutes of Exercise per Session: 40 min  Stress: No Stress Concern Present  . Feeling of Stress : Not at all  Social Connections: Socially Integrated  . Frequency of Communication with Friends and Family: More than three times a week  . Frequency of Social Gatherings with Friends and Family: More than three times a week  . Attends Religious Services: More than 4 times per year  . Active Member of Clubs  or Organizations: Yes  . Attends Archivist Meetings: More than 4 times per year  . Marital Status: Married    Tobacco Counseling Counseling given: Not Answered   Clinical Intake:  Pre-visit preparation completed: Yes  Pain : 0-10 Pain Score: 2  Pain Type: Chronic pain Pain Location: Leg (lower back) Pain Orientation: Right Pain Descriptors / Indicators: Aching Pain Onset: More than a month ago Pain Frequency: Constant     Nutritional Risks:  None Diabetes: No  How often do you need to have someone help you when you read instructions, pamphlets, or other written materials from your doctor or pharmacy?: 1 - Never  Diabetic?No   Interpreter Needed?: No  Information entered by :: [place] of Daily Living In your present state of health, do you have any difficulty performing the following activities: 06/28/2020  Hearing? Y  Comment getting hearing aids in 3 weeks  Vision? N  Difficulty concentrating or making decisions? N  Walking or climbing stairs? N  Dressing or bathing? N  Doing errands, shopping? N  Preparing Food and eating ? N  Using the Toilet? N  In the past six months, have you accidently leaked urine? N  Do you have problems with loss of bowel control? N  Managing your Medications? N  Managing your Finances? N  Housekeeping or managing your Housekeeping? N  Some recent data might be hidden    Patient Care Team: [name], MD as PCP - General Belva Crome, MD as PCP - Cardiology (Cardiology) Marica Otter, OD (Optometry) [place], [place], [facility] (Pharmacist)  Indicate any recent Medical Services you may have received from other than [facility] providers in the past year (date may be approximate).     Assessment:   This is a routine wellness examination for Jacory.  Hearing/Vision screen  Hearing Screening   125Hz  250Hz  500Hz  1000Hz  2000Hz  3000Hz  4000Hz  6000Hz  8000Hz   Right ear:           Left ear:            Vision Screening Comments: Gets eye examined every year. Has cataracts forming   Dietary issues and exercise activities discussed: Current Exercise Habits: Home exercise routine, Type of exercise: yoga;walking (pickel ball, water exercises), Time (Minutes): 35, Frequency (Times/Week): 4, Weekly Exercise (Minutes/Week): 140  Goals    . Cut out extra servings    . Lose 10 lbs      Depression Screen PHQ 2/9 Scores 06/28/2020 06/26/2020 03/09/2020 06/07/2019 06/01/2018 05/14/2017 03/29/2015  PHQ - 2 Score 0 0 1 0 0 0 0  PHQ- 9 Score 0 1 4 - 1 - -    Fall Risk Fall Risk  06/28/2020 06/26/2020 06/07/2019 06/01/2018 05/14/2017  Falls in the past year? 0 0 1 0 No  Number falls in past yr: 0 - 0 - -  Injury with Fall? 0 - 0 - -  Risk for fall due to : No Fall Risks - Other (Comment) - -  Risk for fall due to: Comment - - vertigo - -  Follow up Falls evaluation completed;Falls prevention discussed - Falls evaluation completed;Education provided;Falls prevention discussed - -    FALL RISK PREVENTION PERTAINING TO THE HOME:  Any stairs in or around the home? No  If so, are there any without handrails? No  Home free of loose throw rugs in walkways, pet beds, electrical cords, etc? Yes  Adequate lighting in your home to reduce risk of falls? Yes   ASSISTIVE DEVICES UTILIZED TO PREVENT FALLS:  Life alert? No  Use of a cane, walker or w/c? No  Grab bars in the bathroom? No  Shower chair or bench in shower? No  Elevated toilet seat or a handicapped toilet? No   TIMED UP AND GO:  Was the test performed? Yes .  Length of time to ambulate 10 feet: 3 sec.   Gait steady and fast without use of assistive device  Cognitive Function: Normal  cognitive status assessed by direct observation by this Nurse Health Advisor. No abnormalities found.   MMSE - Mini Mental State Exam 05/14/2017  Not completed: (No Data)     6CIT Screen 06/07/2019  What month? 0 points  What time? 0 points  Count back from 20  0 points  Months in reverse 0 points  Repeat phrase 0 points    Immunizations Immunization History  Administered Date(s) Administered  . Influenza Split 03/18/2012, 01/12/2020  . Influenza Whole 03/13/2010  . Influenza, High Dose Seasonal PF 04/05/2013, 03/08/2015, 03/13/2017, 01/16/2018, 03/04/2019  . Influenza,inj,Quad PF,6+ Mos 01/28/2014  . PFIZER(Purple Top)SARS-COV-2 Vaccination 06/02/2019, 06/23/2019, 01/31/2020  . Pneumococcal Conjugate-13 01/28/2014  . Pneumococcal Polysaccharide-23 11/19/2010  . Td 09/20/2009  . Tdap 01/11/2013  . Zoster 01/01/2013  . Zoster Recombinat (Shingrix) 11/03/2018, 01/09/2019    TDAP status: Up to date  Flu Vaccine status: Up to date  Pneumococcal vaccine status: Up to date  Covid-19 vaccine status: Completed vaccines  Qualifies for Shingles Vaccine? Yes   Zostavax completed Yes   Shingrix Completed?: Yes  Screening Tests Health Maintenance  Topic Date Due  . Hepatitis C Screening  07/19/2043 (Originally 1945-09-04)  . COLONOSCOPY (Pts 45-62yrs Insurance coverage will need to be confirmed)  07/12/2022  . TETANUS/TDAP  05/28/2028  . INFLUENZA VACCINE  Completed  . COVID-19 Vaccine  Completed  . PNA vac Low Risk Adult  Completed    Health Maintenance  There are no preventive care reminders to display for this patient.  Colorectal cancer screening: Type of screening: Colonoscopy. Completed 07/12/2019. Repeat every 3 years  Lung Cancer Screening: (Low Dose CT Chest recommended if Age 31-80 years, 30 pack-year currently smoking OR have quit w/in 15years.) does not qualify.   Lung Cancer Screening Referral: N/A   Additional Screening:  Hepatitis C Screening: does qualify;   Vision Screening: Recommended annual ophthalmology exams for early detection of glaucoma and other disorders of the eye. Is the patient up to date with their annual eye exam?  Yes  Who is the provider or what is the name of the office in which the patient  attends annual eye exams? Dr. Bufford Buttner If pt is not established with a provider, would they like to be referred to a provider to establish care? No .   Dental Screening: Recommended annual dental exams for proper oral hygiene  Community Resource Referral / Chronic Care Management: CRR required this visit?  No   CCM required this visit?  No      Plan:     I have personally reviewed and noted the following in the patient's chart:   . Medical and social history . Use of alcohol, tobacco or illicit drugs  . Current medications and supplements . Functional ability and status . Nutritional status . Physical activity . Advanced directives . List of other physicians . Hospitalizations, surgeries, and ER visits in previous 12 months . Vitals . Screenings to include cognitive, depression, and falls . Referrals and appointments  In addition, I have reviewed and discussed with patient certain preventive protocols, quality metrics, and best practice recommendations. A written personalized care plan for preventive services as well as general preventive health recommendations were provided to patient.     Ofilia Neas, LPN   10/31/3084   Nurse Notes: None

## 2020-06-28 NOTE — Patient Instructions (Signed)
Donald Pope , Thank you for taking time to come for your Medicare Wellness Visit. I appreciate your ongoing commitment to your health goals. Please review the following plan we discussed and let me know if I can assist you in the future.   Screening recommendations/referrals: Colonoscopy: Up to date, next due 07/12/2022 Recommended yearly ophthalmology/optometry visit for glaucoma screening and checkup Recommended yearly dental visit for hygiene and checkup  Vaccinations: Influenza vaccine: Up to date, next due fall 2022  Pneumococcal vaccine: Completed series  Tdap vaccine: Up to date, next due 01/12/2023 Shingles vaccine: Completed series    Advanced directives: Please bring in copies of your advanced medical directives so that we may scan them into your chart.   Conditions/risks identified: None   Next appointment: None   Preventive Care 76 Years and Older, Male Preventive care refers to lifestyle choices and visits with your health care provider that can promote health and wellness. What does preventive care include?  A yearly physical exam. This is also called an annual well check.  Dental exams once or twice a year.  Routine eye exams. Ask your health care provider how often you should have your eyes checked.  Personal lifestyle choices, including:  Daily care of your teeth and gums.  Regular physical activity.  Eating a healthy diet.  Avoiding tobacco and drug use.  Limiting alcohol use.  Practicing safe sex.  Taking low doses of aspirin every day.  Taking vitamin and mineral supplements as recommended by your health care provider. What happens during an annual well check? The services and screenings done by your health care provider during your annual well check will depend on your age, overall health, lifestyle risk factors, and family history of disease. Counseling  Your health care provider may ask you questions about your:  Alcohol use.  Tobacco  use.  Drug use.  Emotional well-being.  Home and relationship well-being.  Sexual activity.  Eating habits.  History of falls.  Memory and ability to understand (cognition).  Work and work Statistician. Screening  You may have the following tests or measurements:  Height, weight, and BMI.  Blood pressure.  Lipid and cholesterol levels. These may be checked every 5 years, or more frequently if you are over 40 years old.  Skin check.  Lung cancer screening. You may have this screening every year starting at age 76 if you have a 30-pack-year history of smoking and currently smoke or have quit within the past 15 years.  Fecal occult blood test (FOBT) of the stool. You may have this test every year starting at age 76.  Flexible sigmoidoscopy or colonoscopy. You may have a sigmoidoscopy every 5 years or a colonoscopy every 10 years starting at age 76.  Prostate cancer screening. Recommendations will vary depending on your family history and other risks.  Hepatitis C blood test.  Hepatitis B blood test.  Sexually transmitted disease (STD) testing.  Diabetes screening. This is done by checking your blood sugar (glucose) after you have not eaten for a while (fasting). You may have this done every 1-3 years.  Abdominal aortic aneurysm (AAA) screening. You may need this if you are a current or former smoker.  Osteoporosis. You may be screened starting at age 76 if you are at high risk. Talk with your health care provider about your test results, treatment options, and if necessary, the need for more tests. Vaccines  Your health care provider may recommend certain vaccines, such as:  Influenza vaccine. This  is recommended every year.  Tetanus, diphtheria, and acellular pertussis (Tdap, Td) vaccine. You may need a Td booster every 10 years.  Zoster vaccine. You may need this after age 76.  Pneumococcal 13-valent conjugate (PCV13) vaccine. One dose is recommended after age  76.  Pneumococcal polysaccharide (PPSV23) vaccine. One dose is recommended after age 76. Talk to your health care provider about which screenings and vaccines you need and how often you need them. This information is not intended to replace advice given to you by your health care provider. Make sure you discuss any questions you have with your health care provider. Document Released: 05/26/2015 Document Revised: 01/17/2016 Document Reviewed: 02/28/2015 Elsevier Interactive Patient Education  2017 [place] Prevention in the Home Falls can cause injuries. They can happen to people of all ages. There are many things you can do to make your home safe and to help prevent falls. What can I do on the outside of my home?  Regularly fix the edges of walkways and driveways and fix any cracks.  Remove anything that might make you trip as you walk through a door, such as a raised step or threshold.  Trim any bushes or trees on the path to your home.  Use bright outdoor lighting.  Clear any walking paths of anything that might make someone trip, such as rocks or tools.  Regularly check to see if handrails are loose or broken. Make sure that both sides of any steps have handrails.  Any raised decks and porches should have guardrails on the edges.  Have any leaves, snow, or ice cleared regularly.  Use sand or salt on walking paths during winter.  Clean up any spills in your garage right away. This includes oil or grease spills. What can I do in the bathroom?  Use night lights.  Install grab bars by the toilet and in the tub and shower. Do not use towel bars as grab bars.  Use non-skid mats or decals in the tub or shower.  If you need to sit down in the shower, use a plastic, non-slip stool.  Keep the floor dry. Clean up any water that spills on the floor as soon as it happens.  Remove soap buildup in the tub or shower regularly.  Attach bath mats securely with double-sided  non-slip rug tape.  Do not have throw rugs and other things on the floor that can make you trip. What can I do in the bedroom?  Use night lights.  Make sure that you have a light by your bed that is easy to reach.  Do not use any sheets or blankets that are too big for your bed. They should not hang down onto the floor.  Have a firm chair that has side arms. You can use this for support while you get dressed.  Do not have throw rugs and other things on the floor that can make you trip. What can I do in the kitchen?  Clean up any spills right away.  Avoid walking on wet floors.  Keep items that you use a lot in easy-to-reach places.  If you need to reach something above you, use a strong step stool that has a grab bar.  Keep electrical cords out of the way.  Do not use floor polish or wax that makes floors slippery. If you must use wax, use non-skid floor wax.  Do not have throw rugs and other things on the floor that can make you  trip. What can I do with my stairs?  Do not leave any items on the stairs.  Make sure that there are handrails on both sides of the stairs and use them. Fix handrails that are broken or loose. Make sure that handrails are as long as the stairways.  Check any carpeting to make sure that it is firmly attached to the stairs. Fix any carpet that is loose or worn.  Avoid having throw rugs at the top or bottom of the stairs. If you do have throw rugs, attach them to the floor with carpet tape.  Make sure that you have a light switch at the top of the stairs and the bottom of the stairs. If you do not have them, ask someone to add them for you. What else can I do to help prevent falls?  Wear shoes that:  Do not have high heels.  Have rubber bottoms.  Are comfortable and fit you well.  Are closed at the toe. Do not wear sandals.  If you use a stepladder:  Make sure that it is fully opened. Do not climb a closed stepladder.  Make sure that both  sides of the stepladder are locked into place.  Ask someone to hold it for you, if possible.  Clearly mark and make sure that you can see:  Any grab bars or handrails.  First and last steps.  Where the edge of each step is.  Use tools that help you move around (mobility aids) if they are needed. These include:  Canes.  Walkers.  Scooters.  Crutches.  Turn on the lights when you go into a dark area. Replace any light bulbs as soon as they burn out.  Set up your furniture so you have a clear path. Avoid moving your furniture around.  If any of your floors are uneven, fix them.  If there are any pets around you, be aware of where they are.  Review your medicines with your doctor. Some medicines can make you feel dizzy. This can increase your chance of falling. Ask your doctor what other things that you can do to help prevent falls. This information is not intended to replace advice given to you by your health care provider. Make sure you discuss any questions you have with your health care provider. Document Released: 02/23/2009 Document Revised: 10/05/2015 Document Reviewed: 06/03/2014 Elsevier Interactive Patient Education  2017 [employer].

## 2020-06-30 DIAGNOSIS — R252 Cramp and spasm: Secondary | ICD-10-CM | POA: Diagnosis not present

## 2020-07-01 LAB — MAGNESIUM: Magnesium: 2.1 mg/dL (ref 1.6–2.3)

## 2020-07-10 ENCOUNTER — Other Ambulatory Visit: Payer: Self-pay

## 2020-07-10 ENCOUNTER — Ambulatory Visit (INDEPENDENT_AMBULATORY_CARE_PROVIDER_SITE_OTHER): Payer: PPO | Admitting: Family Medicine

## 2020-07-10 ENCOUNTER — Encounter (INDEPENDENT_AMBULATORY_CARE_PROVIDER_SITE_OTHER): Payer: Self-pay | Admitting: Family Medicine

## 2020-07-10 VITALS — BP 138/71 | HR 75 | Temp 97.9°F | Ht 67.0 in | Wt 178.0 lb

## 2020-07-10 DIAGNOSIS — Z6830 Body mass index (BMI) 30.0-30.9, adult: Secondary | ICD-10-CM | POA: Diagnosis not present

## 2020-07-10 DIAGNOSIS — E559 Vitamin D deficiency, unspecified: Secondary | ICD-10-CM

## 2020-07-10 DIAGNOSIS — E7849 Other hyperlipidemia: Secondary | ICD-10-CM

## 2020-07-10 DIAGNOSIS — E669 Obesity, unspecified: Secondary | ICD-10-CM | POA: Diagnosis not present

## 2020-07-10 DIAGNOSIS — R7303 Prediabetes: Secondary | ICD-10-CM | POA: Diagnosis not present

## 2020-07-10 MED ORDER — VITAMIN D (ERGOCALCIFEROL) 1.25 MG (50000 UNIT) PO CAPS: 50000.0000 [IU] | ORAL_CAPSULE | ORAL | 0 refills | Status: DC

## 2020-07-12 NOTE — Progress Notes (Signed)
Chief Complaint:   OBESITY Donald Pope is here to discuss his progress with his obesity treatment plan along with follow-up of his obesity related diagnoses.   Today's visit was #: 8 Starting weight: 200 lbs Starting date: 03/09/2020 Today's weight: 178 lbs Today's date: 07/10/2020 Total lbs lost to date: 22 lbs Body mass index is 27.88 kg/m.  Total weight loss percentage to date: -11.00%  Interim History:  Donald Pope met with his PM&R doctor, Dr. Posey Pronto, regarding his back pain.  He says it went well.  He was changed from Cymbalta to Elavil.  He says he is in less pain now.  Donald Pope is here for a follow up office visit.  he is following the meal plan without concern or issues.  Patient's meal and food recall appears to be accurate and consistent with what is on the plan.  When on plan, his hunger and cravings are well controlled.    Current Meal Plan: the Category 2 Plan for 70% of the time.  Current Exercise Plan: Pickle ball/walking/yoga for 60-120 minutes 5 times per week. Current Anti-Obesity Medications: None.   Assessment/Plan:   No orders of the defined types were placed in this encounter.   Medications Discontinued During This Encounter  Medication Reason  . Vitamin D, Ergocalciferol, (DRISDOL) 1.25 MG (50000 UNIT) CAPS capsule Reorder     Meds ordered this encounter  Medications  . Vitamin D, Ergocalciferol, (DRISDOL) 1.25 MG (50000 UNIT) CAPS capsule    Sig: Take 1 capsule (50,000 Units total) by mouth every 7 (seven) days.    Dispense:  4 capsule    Refill:  0     1. Prediabetes Improving, but not optimized. Goal is HgbA1c < 5.7.  Medication: None.  Decreased A1c and fasting insulin lower than prior.    Plan:  Discussed labs with patient today.   He will continue to focus on protein-rich, low simple carbohydrate foods. We reviewed the importance of hydration, regular exercise for stress reduction, and restorative sleep.   Lab Results  Component Value  Date   HGBA1C 5.8 (H) 06/21/2020   Lab Results  Component Value Date   INSULIN 11.2 06/21/2020   INSULIN 35.6 (H) 03/09/2020   2. Other hyperlipidemia Course: Controlled. Lipid-lowering medications: Crestor 5 mg daily.  Lower TG and LDL.  Elevated HDL.  Plan: Discussed labs with patient today.  Dietary changes: Increase soluble fiber, decrease simple carbohydrates, decrease saturated fat. Exercise changes: Moderate to vigorous-intensity aerobic activity 150 minutes per week or as tolerated. We will continue to monitor along with PCP/specialists as it pertains to his weight loss journey.  Lab Results  Component Value Date   CHOL 131 06/21/2020   HDL 39 (L) 06/21/2020   LDLCALC 72 06/21/2020   LDLDIRECT 89.0 06/16/2017   TRIG 109 06/21/2020   CHOLHDL 3.4 06/21/2020   Lab Results  Component Value Date   ALT 23 03/09/2020   AST 27 03/09/2020   ALKPHOS 84 03/09/2020   BILITOT 0.3 03/09/2020   The 10-year ASCVD risk score Donald Bussing DC Jr., et al., 2013) is: 28.3%   Values used to calculate the score:     Age: 75 years     Sex: Male     Is Non-Hispanic African American: No     Diabetic: No     Tobacco smoker: Yes     Systolic Blood Pressure: 366 mmHg     Is BP treated: No     HDL Cholesterol: 39 mg/dL  Total Cholesterol: 131 mg/dL  3. Vitamin D deficiency At goal. Current vitamin D is 56.7, tested on 06/21/2020. Optimal goal > 50 ng/dL.  He is taking vitamin D 50,000 IU weekly.  Plan:  Discussed labs with patient today.  Continue to take prescription Vitamin D '@50' ,000 IU every week as prescribed.  Follow-up for routine testing of Vitamin D, at least 2-3 times per year to avoid over-replacement.  - Refill Vitamin D, Ergocalciferol, (DRISDOL) 1.25 MG (50000 UNIT) CAPS capsule; Take 1 capsule (50,000 Units total) by mouth every 7 (seven) days.  Dispense: 4 capsule; Refill: 0  4. Class 1 obesity with serious comorbidity and body mass index (BMI) of 30.0 to 30.9 in adult, unspecified  obesity type  Course: Donald Pope is currently in the action stage of change. As such, his goal is to continue with weight loss efforts.   Nutrition goals: He has agreed to the Category 2 Plan.   Exercise goals: Per PM&R.  Behavioral modification strategies: increasing lean protein intake, meal planning and cooking strategies and planning for success.  Donald Pope has agreed to follow-up with our clinic in 4 weeks. He was informed of the importance of frequent follow-up visits to maximize his success with intensive lifestyle modifications for his multiple health conditions.   Objective:   Blood pressure 138/71, pulse 75, temperature 97.9 F (36.6 C), height '5\' 7"'  (1.702 m), weight 178 lb (80.7 kg), SpO2 98 %. Body mass index is 27.88 kg/m.  General: Cooperative, alert, well developed, in no acute distress. HEENT: Conjunctivae and lids unremarkable. Cardiovascular: Regular rhythm.  Lungs: Normal work of breathing. Neurologic: No focal deficits.   Lab Results  Component Value Date   CREATININE 1.06 03/09/2020   BUN 17 03/09/2020   NA 139 03/09/2020   K 4.5 03/09/2020   CL 101 03/09/2020   CO2 23 03/09/2020   Lab Results  Component Value Date   ALT 23 03/09/2020   AST 27 03/09/2020   ALKPHOS 84 03/09/2020   BILITOT 0.3 03/09/2020   Lab Results  Component Value Date   HGBA1C 5.8 (H) 06/21/2020   HGBA1C 6.2 (H) 03/09/2020   HGBA1C 5.8 (H) 11/27/2017   Lab Results  Component Value Date   INSULIN 11.2 06/21/2020   INSULIN 35.6 (H) 03/09/2020   Lab Results  Component Value Date   TSH 2.510 03/09/2020   Lab Results  Component Value Date   CHOL 131 06/21/2020   HDL 39 (L) 06/21/2020   LDLCALC 72 06/21/2020   LDLDIRECT 89.0 06/16/2017   TRIG 109 06/21/2020   CHOLHDL 3.4 06/21/2020   Lab Results  Component Value Date   WBC 6.4 03/09/2020   HGB 14.0 03/09/2020   HCT 40.9 03/09/2020   MCV 91 03/09/2020   PLT 126 (L) 03/09/2020   Obesity Behavioral Intervention:    Approximately 15 minutes were spent on the discussion below.  ASK: We discussed the diagnosis of obesity with Donald Pope today and Donald Pope agreed to give [place] permission to discuss obesity behavioral modification therapy today.  ASSESS: Donald Pope has the diagnosis of obesity and his BMI today is 27.9. Donald Pope is in the action stage of change.   ADVISE: Donald Pope was educated on the multiple health risks of obesity as well as the benefit of weight loss to improve his health. He was advised of the need for long term treatment and the importance of lifestyle modifications to improve his current health and to decrease his risk of future health problems.  AGREE: Multiple dietary modification  options and treatment options were discussed and Donald Pope agreed to follow the recommendations documented in the above note.  ARRANGE: Donald Pope was educated on the importance of frequent visits to treat obesity as outlined per CMS and USPSTF guidelines and agreed to schedule his next follow up appointment today.  Attestation Statements:   Reviewed by clinician on day of visit: allergies, medications, problem list, medical history, surgical history, family history, social history, and previous encounter notes.  I, Water quality scientist, CMA, am acting as Location manager for [employer], DO.  I have reviewed the above documentation for accuracy and completeness, and I agree with the above. Donald Pope, D.O.  [place] was signed into law in 2016 which includes the topic of electronic health records.  This provides immediate access to information in MyChart.  This includes consultation notes, operative notes, office notes, lab results and pathology reports.  If you have any questions about what you read please let us know at your next visit so we can discuss your concerns and take corrective action if need be.  We are right here with you.

## 2020-07-18 ENCOUNTER — Other Ambulatory Visit: Payer: Self-pay | Admitting: Physical Medicine & Rehabilitation

## 2020-07-26 ENCOUNTER — Other Ambulatory Visit: Payer: Self-pay | Admitting: Family Medicine

## 2020-08-03 ENCOUNTER — Encounter: Payer: PPO | Admitting: Physical Medicine & Rehabilitation

## 2020-08-07 ENCOUNTER — Encounter: Payer: Self-pay | Admitting: Physical Medicine & Rehabilitation

## 2020-08-07 ENCOUNTER — Encounter (INDEPENDENT_AMBULATORY_CARE_PROVIDER_SITE_OTHER): Payer: Self-pay | Admitting: Family Medicine

## 2020-08-07 ENCOUNTER — Ambulatory Visit (INDEPENDENT_AMBULATORY_CARE_PROVIDER_SITE_OTHER): Payer: PPO | Admitting: Family Medicine

## 2020-08-07 ENCOUNTER — Encounter: Payer: PPO | Attending: Physical Medicine & Rehabilitation | Admitting: Physical Medicine & Rehabilitation

## 2020-08-07 ENCOUNTER — Other Ambulatory Visit: Payer: Self-pay

## 2020-08-07 VITALS — BP 138/80 | HR 81 | Temp 98.2°F | Ht 67.0 in | Wt 182.8 lb

## 2020-08-07 VITALS — BP 127/79 | HR 72 | Temp 97.9°F | Ht 67.0 in | Wt 178.0 lb

## 2020-08-07 DIAGNOSIS — E669 Obesity, unspecified: Secondary | ICD-10-CM | POA: Diagnosis not present

## 2020-08-07 DIAGNOSIS — G5701 Lesion of sciatic nerve, right lower limb: Secondary | ICD-10-CM

## 2020-08-07 DIAGNOSIS — R7303 Prediabetes: Secondary | ICD-10-CM

## 2020-08-07 DIAGNOSIS — M5441 Lumbago with sciatica, right side: Secondary | ICD-10-CM

## 2020-08-07 DIAGNOSIS — E559 Vitamin D deficiency, unspecified: Secondary | ICD-10-CM

## 2020-08-07 DIAGNOSIS — R252 Cramp and spasm: Secondary | ICD-10-CM | POA: Diagnosis not present

## 2020-08-07 DIAGNOSIS — Z6831 Body mass index (BMI) 31.0-31.9, adult: Secondary | ICD-10-CM

## 2020-08-07 DIAGNOSIS — G479 Sleep disorder, unspecified: Secondary | ICD-10-CM | POA: Diagnosis not present

## 2020-08-07 DIAGNOSIS — G8929 Other chronic pain: Secondary | ICD-10-CM | POA: Diagnosis not present

## 2020-08-07 MED ORDER — AMITRIPTYLINE HCL 10 MG PO TABS: 10.0000 mg | ORAL_TABLET | Freq: Every day | ORAL | 1 refills | Status: DC

## 2020-08-07 MED ORDER — VITAMIN D (ERGOCALCIFEROL) 1.25 MG (50000 UNIT) PO CAPS: 50000.0000 [IU] | ORAL_CAPSULE | ORAL | 0 refills | Status: DC

## 2020-08-07 NOTE — Progress Notes (Signed)
Subjective:    Patient ID: Donald Pope, male    DOB: 04/03/1946, 75 y.o.   MRN: [id number]  HPI Male with pmh/psh thrombocytopenia, OSA, meniere's (rarely), depression, CAD, BPH, anxiety, back pain, PLIF (L3-L4) presents with right back pain with radiation to RLE.  Initially stated: Started ~2012.  Denies inciting event.  Heat/Ice improve the pain.  Horizontal postures, ambulation exacerbate the pain. Tingling and achy.  Associated cramping. Constant. Associated weakness.  No numbness.  Pain radiates to lateral ankle. Stretching equivocal. Advil helps. Mild benefit with massage. He had surgery for similar symptoms. Denies falls. Main goal is to sleep.  Last clinic visit on 06/27/19.  Since that time, pt states he has not needed Robaxin that often. He does not think he is taking Mobic. He is doing exercises prescribed.  He is going to start playing pickle ball again. Sleep has improved.  Very good benefit with Elavil. He obtained Mag elevated.  He did not state Vit B12 supplementation.   Pain Inventory Average Pain 2 Pain Right Now 2 My pain is intermittent, constant and dull  In the last 24 hours, has pain interfered with the following? General activity 2 Relation with others 2 Enjoyment of life 2 What TIME of day is your pain at its worst? night Sleep (in general) Poor  Pain is worse with: walking, sitting and lying (sleeping) Pain improves with: medication and exercise Relief from Meds: 9  Family History  Problem Relation Age of Onset  . Heart disease Mother 50       CHF  . Heart disease Father   . COPD Father   . Colon polyps Father   . Alcoholism Father   . Hypertension Sister   . Hyperlipidemia Sister   . Stomach cancer Neg Hx   . Colon cancer Neg Hx   . Esophageal cancer Neg Hx   . Ulcerative colitis Neg Hx    Social History   Socioeconomic History  . Marital status: Married    Spouse name: Aubra Pappalardo  . Number of children: 1  . Years of education: 74   .  Highest education level: Bachelor's degree (e.g., BA, AB, BS)  Occupational History  . Occupation: retired  Tobacco Use  . Smoking status: Never Smoker  . Smokeless tobacco: Never Used  Vaping Use  . Vaping Use: Never used  Substance and Sexual Activity  . Alcohol use: Yes    Alcohol/week: 1.0 standard drink    Types: 1 Glasses of wine per week    Comment: 4-5 times a week  . Drug use: No  . Sexual activity: Not Currently  Other Topics Concern  . Not on file  Social History Narrative   Lives with wife in [place] home. Son lives in [place], [place], 1 grandson.    Animal-lover, especially dogs.   Exercises routinely at [facility] enjoys yoga   Social Determinants of Health   Financial Resource Strain: Low Risk   . Difficulty of Paying Living Expenses: Not hard at all  Food Insecurity: No Food Insecurity  . Worried About Charity fundraiser in the Last Year: Never true  . Ran Out of Food in the Last Year: Never true  Transportation Needs: No Transportation Needs  . Lack of Transportation (Medical): No  . Lack of Transportation (Non-Medical): No  Physical Activity: Sufficiently Active  . Days of Exercise per Week: 4 days  . Minutes of Exercise per Session: 40 min  Stress: No Stress Concern Present  .  Feeling of Stress : Not at all  Social Connections: Socially Integrated  . Frequency of Communication with Friends and Family: More than three times a week  . Frequency of Social Gatherings with Friends and Family: More than three times a week  . Attends Religious Services: More than 4 times per year  . Active Member of Clubs or Organizations: Yes  . Attends Archivist Meetings: More than 4 times per year  . Marital Status: Married   Past Surgical History:  Procedure Laterality Date  . CARDIAC CATHETERIZATION     10 yrs ago  . COLONOSCOPY  06/27/2016  . MAXIMUM ACCESS (MAS)POSTERIOR LUMBAR INTERBODY FUSION (PLIF) 1 LEVEL N/A 05/27/2013   Procedure: FOR MAXIMUM ACCESS  (MAS) POSTERIOR LUMBAR INTERBODY FUSION (PLIF) 1 LEVEL ,LUMBAR FOUR-FIVE;  Surgeon: Eustace Randleman, MD;  Location: [place] [facility];  Service: Neurosurgery;  Laterality: N/A;  FOR MAXIMUM ACCESS (MAS) POSTERIOR LUMBAR INTERBODY FUSION (PLIF) 1 LEVEL ,LUMBAR FOUR-FIVE  . NASAL POLYP EXCISION    . TONSILLECTOMY     Past Medical History:  Diagnosis Date  . Allergy   . Anxiety   . Back pain   . Benign prostatic hypertrophy    possible  . Bilateral leg cramps   . CAD, NATIVE VESSEL 10/23/2009  . Chest pain   . DEPRESSION 09/13/2008  . Depression   . False positive stress test    history of, suggestive of inferior ischemia  . GERD 09/13/2008  . HYPERLIPIDEMIA 09/13/2008  . Meniere's disease   . OBSTRUCTIVE SLEEP APNEA 10/23/2009   cpap  . RBBB 10/26/2009   [place]  . Sciatic nerve pain   . Sleep apnea    Cpap  . SOB (shortness of breath)   . Spinal stenosis of lumbar region 08/18/2012  . THROMBOCYTOPENIA 09/13/2008   BP 138/80   Pulse 81   Temp 98.2 F (36.8 C)   Ht 5\' 7"  (1.702 m)   Wt 182 lb 12.8 oz (82.9 kg)   SpO2 98%   BMI 28.63 kg/m   Opioid Risk Score:   Fall Risk Score:  `1  Depression screen PHQ 2/9  Depression screen [facility] 2/9 08/07/2020 06/28/2020 06/26/2020 03/09/2020 06/07/2019 06/01/2018 05/14/2017  Decreased Interest 0 0 0 0 0 0 0  Down, Depressed, Hopeless 0 0 0 1 0 0 0  PHQ - 2 Score 0 0 0 1 0 0 0  Altered sleeping - 0 1 0 - 1 -  Tired, decreased energy - 0 0 1 - 0 -  Change in appetite - 0 0 0 - 0 -  Feeling bad or failure about yourself  - 0 0 1 - 0 -  Trouble concentrating - 0 0 1 - 0 -  Moving slowly or fidgety/restless - 0 0 0 - 0 -  Suicidal thoughts - 0 0 0 - 0 -  PHQ-9 Score - 0 1 4 - 1 -  Difficult doing work/chores - Not difficult at all Not difficult at all Not difficult at all - - -    Review of Systems  Constitutional: Negative.   HENT: Negative.   Eyes: Negative.   Respiratory: Negative.   Cardiovascular: Negative.   Endocrine: Negative.    Genitourinary: Negative.   Musculoskeletal: Positive for arthralgias, back pain, gait problem and myalgias.       Cramps, calves  Spasms  Skin: Negative.   Allergic/Immunologic: Negative.   Hematological: Negative.   Psychiatric/Behavioral: Positive for sleep disturbance.  All other systems  reviewed and are negative.     Objective:   Physical Exam  Constitutional: No distress . Vital signs reviewed. HENT: Normocephalic.  Atraumatic. Eyes: EOMI. No discharge. Cardiovascular: No JVD.   Respiratory: Normal effort.  No stridor.   GI: Non-distended.   Skin: Warm and dry.  Intact. Psych: Normal mood.  Normal behavior. Musc:  -FABERS RLE, compression test Mild right FABERS LLE at hip Mild TTP right piriformis Neuro: Alert Motor: 5/5 b/l LE    Assessment & Plan:  Male with pmh/psh thrombocytopenia, OSA, meniere's (rarely), depression, CAD, BPH, anxiety, back pain, PLIF (L3-L4) presents with right back pain with radiation to RLE.   1. 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 Sleep disturbance  See #1  Continue Elavil 10mg  qhs, educated on signs/symptoms of serotonin syndrome again  Improving  3. Obesity  Continue follow up with weight loss clinic  4. Myalgia   See #1  Will consider trigger point injections  5. Muscle cramping  Mag level WNL  Vit B12, relatively low, encouraged supplementation, reminded  Not associated with statin as patient has tried off the medications  Improving

## 2020-08-15 NOTE — Progress Notes (Signed)
Chief Complaint:   OBESITY Donald Pope is here to discuss his progress with his obesity treatment plan along with follow-up of his obesity related diagnoses.   Today's visit was #: 9 Starting weight: 200 lbs Starting date: 03/09/2020 Today's weight: 178 lbs Today's date: 08/07/2020 Total lbs lost to date: 22 lbs Body mass index is 27.88 kg/m.  Total weight loss percentage to date: -11.00%  Interim History:  Jayel had cold/URI symptoms all last week.  COVID test was negative and he is almost back to normal.  He did not exercise while he was sick.  He also went to [facility] to see his grandchildren recently and did some off plan eating.  Current Meal Plan: the Category 2 Plan for 70% of the time.  Current Exercise Plan: Walking for 45 minutes 3 times per week.  Assessment/Plan:   No orders of the defined types were placed in this encounter.   Medications Discontinued During This Encounter  Medication Reason  . Vitamin D, Ergocalciferol, (DRISDOL) 1.25 MG (50000 UNIT) CAPS capsule Reorder     Meds ordered this encounter  Medications  . Vitamin D, Ergocalciferol, (DRISDOL) 1.25 MG (50000 UNIT) CAPS capsule    Sig: Take 1 capsule (50,000 Units total) by mouth every 7 (seven) days.    Dispense:  8 capsule    Refill:  0    60 d supply.   No RF until OV     1. Prediabetes Not at goal. Goal is HgbA1c < 5.7.  Medication: None.  A1c was 6.2 when he started the program.  It is now 5.8.    Plan:  He will continue to focus on protein-rich, low simple carbohydrate foods. We reviewed the importance of hydration, regular exercise for stress reduction, and restorative sleep.  Reminded him that his journey is about health and wellness much more than just the number on the scale.  Decrease carbohydrates, increase exercise.  Continue prudent nutritional plan.  Recheck labs in 6 months.  Lab Results  Component Value Date   HGBA1C 5.8 (H) 06/21/2020   Lab Results  Component Value Date    INSULIN 11.2 06/21/2020   INSULIN 35.6 (H) 03/09/2020   2. Vitamin D deficiency At goal. Current vitamin D is 56.7, tested on 06/21/2020. Optimal goal > 50 ng/dL.  She is taking vitamin D 50,000 IU weekly.  Plan: Continue to take prescription Vitamin D @50 ,000 IU every week as prescribed.  Recheck vitamin D at the end of August or so.  - Refill Vitamin D, Ergocalciferol, (DRISDOL) 1.25 MG (50000 UNIT) CAPS capsule; Take 1 capsule (50,000 Units total) by mouth every 7 (seven) days.  Dispense: 8 capsule; Refill: 0  3. Obesity, current BMI 27.9  Course: Nicandro is currently in the action stage of change. As such, his goal is to continue with weight loss efforts.   Nutrition goals: He has agreed to the Category 2 Plan.   Exercise goals: As is.  Behavioral modification strategies: increasing lean protein intake and decreasing simple carbohydrates.  Exodus has agreed to follow-up with our clinic in 8 weeks. He was informed of the importance of frequent follow-up visits to maximize his success with intensive lifestyle modifications for his multiple health conditions.   Objective:   Blood pressure 127/79, pulse 72, temperature 97.9 F (36.6 C), height 5\' 7"  (1.702 m), weight 178 lb (80.7 kg), SpO2 98 %. Body mass index is 27.88 kg/m.  General: Cooperative, alert, well developed, in no acute distress. HEENT: Conjunctivae  and lids unremarkable. Cardiovascular: Regular rhythm.  Lungs: Normal work of breathing. Neurologic: No focal deficits.   Lab Results  Component Value Date   CREATININE 1.06 03/09/2020   BUN 17 03/09/2020   NA 139 03/09/2020   K 4.5 03/09/2020   CL 101 03/09/2020   CO2 23 03/09/2020   Lab Results  Component Value Date   ALT 23 03/09/2020   AST 27 03/09/2020   ALKPHOS 84 03/09/2020   BILITOT 0.3 03/09/2020   Lab Results  Component Value Date   HGBA1C 5.8 (H) 06/21/2020   HGBA1C 6.2 (H) 03/09/2020   HGBA1C 5.8 (H) 11/27/2017   Lab Results  Component Value  Date   INSULIN 11.2 06/21/2020   INSULIN 35.6 (H) 03/09/2020   Lab Results  Component Value Date   TSH 2.510 03/09/2020   Lab Results  Component Value Date   CHOL 131 06/21/2020   HDL 39 (L) 06/21/2020   LDLCALC 72 06/21/2020   LDLDIRECT 89.0 06/16/2017   TRIG 109 06/21/2020   CHOLHDL 3.4 06/21/2020   Lab Results  Component Value Date   WBC 6.4 03/09/2020   HGB 14.0 03/09/2020   HCT 40.9 03/09/2020   MCV 91 03/09/2020   PLT 126 (L) 03/09/2020   Obesity Behavioral Intervention:   Approximately 15 minutes were spent on the discussion below.  ASK: We discussed the diagnosis of obesity with Herbie Baltimore today and Sherard agreed to give [place] permission to discuss obesity behavioral modification therapy today.  ASSESS: Torres has the diagnosis of obesity and his BMI today is 27.9. Ariz is in the action stage of change.   ADVISE: Shin was educated on the multiple health risks of obesity as well as the benefit of weight loss to improve his health. He was advised of the need for long term treatment and the importance of lifestyle modifications to improve his current health and to decrease his risk of future health problems.  AGREE: Multiple dietary modification options and treatment options were discussed and Tyner agreed to follow the recommendations documented in the above note.  ARRANGE: Niko was educated on the importance of frequent visits to treat obesity as outlined per CMS and USPSTF guidelines and agreed to schedule his next follow up appointment today.  Attestation Statements:   Reviewed by clinician on day of visit: allergies, medications, problem list, medical history, surgical history, family history, social history, and previous encounter notes.  I, Water quality scientist, CMA, am acting as Location manager for [employer], DO.  I have reviewed the above documentation for accuracy and completeness, and I agree with the above. Marjory Sneddon, D.O.  The [place] was signed into law in 2016 which includes the topic of electronic health records.  This provides immediate access to information in MyChart.  This includes consultation notes, operative notes, office notes, lab results and pathology reports.  If you have any questions about what you read please let us know at your next visit so we can discuss your concerns and take corrective action if need be.  We are right here with you.

## 2020-08-30 ENCOUNTER — Other Ambulatory Visit: Payer: Self-pay | Admitting: Family Medicine

## 2020-09-15 ENCOUNTER — Other Ambulatory Visit: Payer: Self-pay | Admitting: Physical Medicine & Rehabilitation

## 2020-09-22 DIAGNOSIS — G4733 Obstructive sleep apnea (adult) (pediatric): Secondary | ICD-10-CM | POA: Diagnosis not present

## 2020-10-02 NOTE — Progress Notes (Signed)
Asked to evaluate after playing intense round of pickleball; reports having episode of dizziness, tipping forward onto floor after squatting/kneeling down; Alert OX3, no pallor, sweaty from playing p-ball; had just eaten a snack given to him by friend, but reports not eating much today; BP 124/82, HR 98-100, occassional irreg beat, sat 98%; denies chest pain, any other symptoms. Able to stand w/o difficulty, wife present; advised to let her drive home, rest, eat and contact primary MD if any signs of dizziness return. Also, note, he hit himself above R eye with racket after hitting a ball, area slightly red, advised ice as needed.

## 2020-10-05 ENCOUNTER — Encounter: Payer: PPO | Attending: Physical Medicine & Rehabilitation | Admitting: Physical Medicine & Rehabilitation

## 2020-10-05 ENCOUNTER — Encounter: Payer: Self-pay | Admitting: Physical Medicine & Rehabilitation

## 2020-10-05 ENCOUNTER — Other Ambulatory Visit: Payer: Self-pay

## 2020-10-05 VITALS — BP 157/77 | HR 73 | Temp 98.7°F | Ht 67.0 in | Wt 182.0 lb

## 2020-10-05 DIAGNOSIS — G8929 Other chronic pain: Secondary | ICD-10-CM

## 2020-10-05 DIAGNOSIS — G5701 Lesion of sciatic nerve, right lower limb: Secondary | ICD-10-CM | POA: Diagnosis not present

## 2020-10-05 DIAGNOSIS — G479 Sleep disorder, unspecified: Secondary | ICD-10-CM

## 2020-10-05 DIAGNOSIS — R252 Cramp and spasm: Secondary | ICD-10-CM

## 2020-10-05 DIAGNOSIS — M5441 Lumbago with sciatica, right side: Secondary | ICD-10-CM | POA: Diagnosis not present

## 2020-10-05 MED ORDER — METHOCARBAMOL 500 MG PO TABS: 500.0000 mg | ORAL_TABLET | Freq: Two times a day (BID) | ORAL | 3 refills | Status: DC | PRN

## 2020-10-05 MED ORDER — AMITRIPTYLINE HCL 10 MG PO TABS: ORAL_TABLET | ORAL | 3 refills | Status: DC

## 2020-10-05 NOTE — Progress Notes (Signed)
Subjective:    Patient ID: [name], male    DOB: [birth year], 75 y.o.   MRN: [id number]  HPI Male with pmh/psh thrombocytopenia, OSA, meniere's (rarely), depression, CAD, BPH, anxiety, back pain, PLIF (L3-L4) presents with right back pain with radiation to RLE.  Initially stated: Started ~2012.  Denies inciting event.  Heat/Ice improve the pain.  Horizontal postures, ambulation exacerbate the pain. Tingling and achy.  Associated cramping. Constant. Associated weakness.  No numbness.  Pain radiates to lateral ankle. Stretching equivocal. Advil helps. Mild benefit with massage. He had surgery for similar symptoms. Denies falls. Main goal is to sleep.  Last clinic visit on 08/07/20.  Since that time, pt states he is taking medications as prescribed. He has not been doing his exercises regularly. Sleep is well. He states he feels his cramps are returning. He is taking Vit B supplementation. He had 2 falls playing pickleball.   Pain Inventory Average Pain 2 Pain Right Now 2 My pain is intermittent, constant and dull  In the last 24 hours, has pain interfered with the following? General activity 2 Relation with others 2 Enjoyment of life 2 What TIME of day is your pain at its worst? evening Sleep (in general) Good  Pain is worse with: walking, sitting and lying (sleeping) Pain improves with: medication and exercise Relief from Meds: 9  Family History  Problem Relation Age of Onset  . Heart disease Mother 61       CHF  . Heart disease Father   . COPD Father   . Colon polyps Father   . Alcoholism Father   . Hypertension Sister   . Hyperlipidemia Sister   . Stomach cancer Neg Hx   . Colon cancer Neg Hx   . Esophageal cancer Neg Hx   . Ulcerative colitis Neg Hx    Social History   Socioeconomic History  . Marital status: Married    Spouse name: Andres Vest  . Number of children: 1  . Years of education: 70   . Highest education level: Bachelor's degree (e.g., BA, AB, BS)   Occupational History  . Occupation: retired  Tobacco Use  . Smoking status: Never Smoker  . Smokeless tobacco: Never Used  Vaping Use  . Vaping Use: Never used  Substance and Sexual Activity  . Alcohol use: Yes    Alcohol/week: 1.0 standard drink    Types: 1 Glasses of wine per week    Comment: 4-5 times a week  . Drug use: No  . Sexual activity: Not Currently  Other Topics Concern  . Not on file  Social History Narrative   Lives with wife in [place] home. Son lives in [place], [place], 1 grandson.    Animal-lover, especially dogs.   Exercises routinely at [facility], enjoys yoga   Social Determinants of Health   Financial Resource Strain: Low Risk   . Difficulty of Paying Living Expenses: Not hard at all  Food Insecurity: No Food Insecurity  . Worried About Charity fundraiser in the Last Year: Never true  . Ran Out of Food in the Last Year: Never true  Transportation Needs: No Transportation Needs  . Lack of Transportation (Medical): No  . Lack of Transportation (Non-Medical): No  Physical Activity: Sufficiently Active  . Days of Exercise per Week: 4 days  . Minutes of Exercise per Session: 40 min  Stress: No Stress Concern Present  . Feeling of Stress : Not at all  Social Connections: Socially Integrated  .  Frequency of Communication with Friends and Family: More than three times a week  . Frequency of Social Gatherings with Friends and Family: More than three times a week  . Attends Religious Services: More than 4 times per year  . Active Member of Clubs or Organizations: Yes  . Attends Archivist Meetings: More than 4 times per year  . Marital Status: Married   Past Surgical History:  Procedure Laterality Date  . CARDIAC CATHETERIZATION     10 yrs ago  . COLONOSCOPY  06/27/2016  . MAXIMUM ACCESS (MAS)POSTERIOR LUMBAR INTERBODY FUSION (PLIF) 1 LEVEL N/A 05/27/2013   Procedure: FOR MAXIMUM ACCESS (MAS) POSTERIOR LUMBAR INTERBODY FUSION (PLIF) 1 LEVEL ,LUMBAR  FOUR-FIVE;  Surgeon: Eustace Hegeman, MD;  Location: [place] [facility];  Service: Neurosurgery;  Laterality: N/A;  FOR MAXIMUM ACCESS (MAS) POSTERIOR LUMBAR INTERBODY FUSION (PLIF) 1 LEVEL ,LUMBAR FOUR-FIVE  . NASAL POLYP EXCISION    . TONSILLECTOMY     Past Medical History:  Diagnosis Date  . Allergy   . Anxiety   . Back pain   . Benign prostatic hypertrophy    possible  . Bilateral leg cramps   . CAD, NATIVE VESSEL 10/23/2009  . Chest pain   . DEPRESSION 09/13/2008  . Depression   . False positive stress test    history of, suggestive of inferior ischemia  . GERD 09/13/2008  . HYPERLIPIDEMIA 09/13/2008  . Meniere's disease   . OBSTRUCTIVE SLEEP APNEA 10/23/2009   cpap  . RBBB 10/26/2009   [place]  . Sciatic nerve pain   . Sleep apnea    Cpap  . SOB (shortness of breath)   . Spinal stenosis of lumbar region 08/18/2012  . THROMBOCYTOPENIA 09/13/2008   BP (!) 157/77   Pulse 73   Temp 98.7 F (37.1 C)   Ht 5\' 7"  (1.702 m)   Wt 182 lb (82.6 kg)   SpO2 97%   BMI 28.51 kg/m   Opioid Risk Score:   Fall Risk Score:  `1  Depression screen PHQ 2/9  Depression screen [facility] 2/9 08/07/2020 06/28/2020 06/26/2020 03/09/2020 06/07/2019 06/01/2018 05/14/2017  Decreased Interest 0 0 0 0 0 0 0  Down, Depressed, Hopeless 0 0 0 1 0 0 0  PHQ - 2 Score 0 0 0 1 0 0 0  Altered sleeping - 0 1 0 - 1 -  Tired, decreased energy - 0 0 1 - 0 -  Change in appetite - 0 0 0 - 0 -  Feeling bad or failure about yourself  - 0 0 1 - 0 -  Trouble concentrating - 0 0 1 - 0 -  Moving slowly or fidgety/restless - 0 0 0 - 0 -  Suicidal thoughts - 0 0 0 - 0 -  PHQ-9 Score - 0 1 4 - 1 -  Difficult doing work/chores - Not difficult at all Not difficult at all Not difficult at all - - -    Review of Systems  Constitutional: Negative.   HENT: Negative.   Eyes: Negative.   Respiratory: Negative.   Cardiovascular: Negative.   Gastrointestinal: Negative.   Endocrine: Negative.   Genitourinary: Negative.   Musculoskeletal:  Positive for arthralgias, back pain, gait problem and myalgias.       Cramps, calves  Spasms  Skin: Negative.   Allergic/Immunologic: Negative.   Hematological: Negative.   Psychiatric/Behavioral: Positive for sleep disturbance.  All other systems reviewed and are negative.     Objective:  Physical Exam   Constitutional: No distress . Vital signs reviewed. HENT: Normocephalic.  Atraumatic. Eyes: EOMI. No discharge. Cardiovascular: No JVD.   Respiratory: Normal effort.  No stridor.   GI: Non-distended.   Skin: Warm and dry.  Intact. Psych: Normal mood.  Normal behavior. Musc:  -FABERS RLE, compression test previously Neuro: Alert Motor: 5/5 b/l LE    Assessment & Plan:  Male with pmh/psh thrombocytopenia, OSA, meniere's (rarely), depression, CAD, BPH, anxiety, back pain, PLIF (L3-L4) presents with right back pain with radiation to RLE.   1. 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 Sleep disturbance  See #1  Continue Elavil 10mg  qhs, educated on signs/symptoms of serotonin syndrome again  Improving  3. Obesity  Continue follow up with weight loss clinic  4. Myalgia   See #1  Will consider trigger point injections  5. Muscle cramping  Mag level WNL  Continue Vit B12 supplementation  Not associated with statin as patient has tried off the medications  Improving

## 2020-10-12 ENCOUNTER — Ambulatory Visit (INDEPENDENT_AMBULATORY_CARE_PROVIDER_SITE_OTHER): Payer: PPO | Admitting: Family Medicine

## 2020-10-16 ENCOUNTER — Ambulatory Visit: Payer: PPO | Admitting: Physical Medicine & Rehabilitation

## 2020-10-23 ENCOUNTER — Other Ambulatory Visit: Payer: Self-pay | Admitting: Family Medicine

## 2020-12-07 ENCOUNTER — Other Ambulatory Visit: Payer: Self-pay | Admitting: Family Medicine

## 2020-12-28 ENCOUNTER — Other Ambulatory Visit: Payer: Self-pay

## 2020-12-28 ENCOUNTER — Encounter: Payer: Self-pay | Admitting: Physical Medicine & Rehabilitation

## 2020-12-28 ENCOUNTER — Encounter: Payer: PPO | Attending: Physical Medicine & Rehabilitation | Admitting: Physical Medicine & Rehabilitation

## 2020-12-28 VITALS — BP 126/70 | HR 81 | Temp 98.3°F | Ht 67.0 in | Wt 183.4 lb

## 2020-12-28 DIAGNOSIS — G479 Sleep disorder, unspecified: Secondary | ICD-10-CM | POA: Diagnosis not present

## 2020-12-28 DIAGNOSIS — G5701 Lesion of sciatic nerve, right lower limb: Secondary | ICD-10-CM | POA: Diagnosis not present

## 2020-12-28 DIAGNOSIS — M5441 Lumbago with sciatica, right side: Secondary | ICD-10-CM | POA: Diagnosis not present

## 2020-12-28 DIAGNOSIS — R252 Cramp and spasm: Secondary | ICD-10-CM | POA: Diagnosis not present

## 2020-12-28 DIAGNOSIS — G8929 Other chronic pain: Secondary | ICD-10-CM | POA: Diagnosis not present

## 2020-12-28 MED ORDER — METHOCARBAMOL 500 MG PO TABS: 1000.0000 mg | ORAL_TABLET | Freq: Three times a day (TID) | ORAL | 1 refills | Status: DC

## 2020-12-28 MED ORDER — GABAPENTIN 600 MG PO TABS: 1200.0000 mg | ORAL_TABLET | Freq: Two times a day (BID) | ORAL | 2 refills | Status: DC

## 2020-12-28 NOTE — Progress Notes (Signed)
Subjective:    Patient ID: Donald Pope, male    DOB: Apr 12, 1946, 75 y.o.   MRN: [id number]  HPI Male with pmh/psh thrombocytopenia, OSA, meniere's (rarely), depression, CAD, BPH, anxiety, back pain, PLIF (L3-L4) presents with right back pain with radiation to RLE.  Initially stated: Started ~2012.  Denies inciting event.  Heat/Ice improve the pain.  Horizontal postures, ambulation exacerbate the pain. Tingling and achy.  Associated cramping. Constant. Associated weakness.  No numbness.  Pain radiates to lateral ankle. Stretching equivocal. Advil helps. Mild benefit with massage. He had surgery for similar symptoms. Denies falls. Main goal is to sleep.  Last clinic visit on 10/05/20.  Since that time, pt states he was doing well until he pulled a muscle playing pickle ball. He is doing piriformis exercises.  Advil provides benefit.   Pain Inventory Average Pain 6 Pain Right Now 6 My pain is intermittent, sharp, and aching  In the last 24 hours, has pain interfered with the following? General activity 7 Relation with others 7 Enjoyment of life 7 What TIME of day is your pain at its worst? night Sleep (in general) Fair  Pain is worse with: walking and lying (sleeping) Pain improves with: heat/ice, medication, and exercise Relief from Meds: 7  Family History  Problem Relation Age of Onset   Heart disease Mother 44       CHF   Heart disease Father    COPD Father    Colon polyps Father    Alcoholism Father    Hypertension Sister    Hyperlipidemia Sister    Stomach cancer Neg Hx    Colon cancer Neg Hx    Esophageal cancer Neg Hx    Ulcerative colitis Neg Hx    Social History   Socioeconomic History   Marital status: Married    Spouse name: Yug Jha   Number of children: 1   Years of education: 16    Highest education level: Bachelor's degree (e.g., BA, AB, BS)  Occupational History   Occupation: retired  Tobacco Use   Smoking status: Never   Smokeless tobacco:  Never  Vaping Use   Vaping Use: Never used  Substance and Sexual Activity   Alcohol use: Yes    Alcohol/week: 1.0 standard drink    Types: 1 Glasses of wine per week    Comment: 4-5 times a week   Drug use: No   Sexual activity: Not Currently  Other Topics Concern   Not on file  Social History Narrative   Lives with wife in [place] [facility]. Son lives in [place], [place], 1 grandson.    Animal-lover, especially dogs.   Exercises routinely at [facility], enjoys yoga   Social Determinants of Health   Financial Resource Strain: Low Risk    Difficulty of Paying Living Expenses: Not hard at all  Food Insecurity: No Food Insecurity   Worried About Charity fundraiser in the Last Year: Never true   Arboriculturist in the Last Year: Never true  Transportation Needs: No Transportation Needs   Lack of Transportation (Medical): No   Lack of Transportation (Non-Medical): No  Physical Activity: Sufficiently Active   Days of Exercise per Week: 4 days   Minutes of Exercise per Session: 40 min  Stress: No Stress Concern Present   Feeling of Stress : Not at all  Social Connections: Socially Integrated   Frequency of Communication with Friends and Family: More than three times a week   Frequency of  Social Gatherings with Friends and Family: More than three times a week   Attends Religious Services: More than 4 times per year   Active Member of Clubs or Organizations: Yes   Attends Music therapist: More than 4 times per year   Marital Status: Married   Past Surgical History:  Procedure Laterality Date   CARDIAC CATHETERIZATION     10 yrs ago   COLONOSCOPY  06/27/2016   MAXIMUM ACCESS (MAS)POSTERIOR LUMBAR INTERBODY FUSION (PLIF) 1 LEVEL N/A 05/27/2013   Procedure: FOR MAXIMUM ACCESS (MAS) POSTERIOR LUMBAR INTERBODY FUSION (PLIF) 1 LEVEL ,LUMBAR FOUR-FIVE;  Surgeon: Eustace Pressly, MD;  Location: [place] [facility];  Service: Neurosurgery;  Laterality: N/A;  FOR MAXIMUM ACCESS (MAS) POSTERIOR  LUMBAR INTERBODY FUSION (PLIF) 1 LEVEL ,LUMBAR FOUR-FIVE   NASAL POLYP EXCISION     TONSILLECTOMY     Past Medical History:  Diagnosis Date   Allergy    Anxiety    Back pain    Benign prostatic hypertrophy    possible   Bilateral leg cramps    CAD, NATIVE VESSEL 10/23/2009   Chest pain    DEPRESSION 09/13/2008   Depression    False positive stress test    history of, suggestive of inferior ischemia   GERD 09/13/2008   HYPERLIPIDEMIA 09/13/2008   Meniere's disease    OBSTRUCTIVE SLEEP APNEA 10/23/2009   cpap   RBBB 10/26/2009   [place]   Sciatic nerve pain    Sleep apnea    Cpap   SOB (shortness of breath)    Spinal stenosis of lumbar region 08/18/2012   THROMBOCYTOPENIA 09/13/2008   BP 126/70   Pulse 81   Temp 98.3 F (36.8 C)   Ht '5\' 7"'$  (1.702 m)   Wt 183 lb 6.4 oz (83.2 kg)   SpO2 96%   BMI 28.72 kg/m   Opioid Risk Score:   Fall Risk Score:  `1  Depression screen PHQ 2/9  Depression screen [facility] 2/9 12/28/2020 08/07/2020 06/28/2020 06/26/2020 03/09/2020 06/07/2019 06/01/2018  Decreased Interest 0 0 0 0 0 0 0  Down, Depressed, Hopeless 0 0 0 0 1 0 0  PHQ - 2 Score 0 0 0 0 1 0 0  Altered sleeping - - 0 1 0 - 1  Tired, decreased energy - - 0 0 1 - 0  Change in appetite - - 0 0 0 - 0  Feeling bad or failure about yourself  - - 0 0 1 - 0  Trouble concentrating - - 0 0 1 - 0  Moving slowly or fidgety/restless - - 0 0 0 - 0  Suicidal thoughts - - 0 0 0 - 0  PHQ-9 Score - - 0 1 4 - 1  Difficult doing work/chores - - Not difficult at all Not difficult at all Not difficult at all - -    Review of Systems  Constitutional: Negative.   HENT: Negative.    Eyes: Negative.   Respiratory: Negative.    Cardiovascular: Negative.   Gastrointestinal: Negative.   Endocrine: Negative.   Genitourinary: Negative.   Musculoskeletal:  Positive for arthralgias, back pain, gait problem and myalgias.       Cramps, calves  Spasms  Skin: Negative.   Allergic/Immunologic: Negative.    Hematological: Negative.   Psychiatric/Behavioral:  Positive for sleep disturbance.   All other systems reviewed and are negative.    Objective:   Physical Exam   Constitutional: No distress . Vital signs reviewed. HENT:  Normocephalic.  Atraumatic. Eyes: EOMI. No discharge. Cardiovascular: No JVD.   Respiratory: Normal effort.  No stridor.   GI: Non-distended.   Skin: Warm and dry.  Intact. Psych: Normal mood.  Normal behavior. Musc: No edema in extremities.  TTP right lateral leg.  Pain in right lateral calf with AROM and PROM. Neuro: Alert Motor: 5/5 b/l LE Neg seated SLR on right.     Assessment & Plan:  Male with pmh/psh thrombocytopenia, OSA, meniere's (rarely), depression, CAD, BPH, anxiety, back pain, PLIF (L3-L4) presents with right back pain with radiation to RLE.   1. 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 Sleep disturbance  See #1  Continue Elavil '10mg'$  qhs, educated on signs/symptoms of serotonin syndrome again  Improving  3. Obesity  Continue follow up with weight loss clinic  4. Myalgia   See #1  Will consider trigger point injections  5. Muscle cramping  Mag level WNL  See #1  Continue Vit B12 supplementation  Not associated with statin as patient has tried off the medications  Improving  6. Localized pain to right lateral leg  See #1, #5  PT ordered  Will consider more central workup if unresolved  Cont ice  Trial rolling pin

## 2021-02-01 ENCOUNTER — Encounter: Payer: PPO | Admitting: Physical Medicine & Rehabilitation

## 2021-02-05 ENCOUNTER — Other Ambulatory Visit: Payer: Self-pay

## 2021-02-05 ENCOUNTER — Ambulatory Visit: Payer: PPO | Attending: Physical Medicine & Rehabilitation

## 2021-02-05 DIAGNOSIS — G8929 Other chronic pain: Secondary | ICD-10-CM | POA: Insufficient documentation

## 2021-02-05 DIAGNOSIS — R262 Difficulty in walking, not elsewhere classified: Secondary | ICD-10-CM | POA: Diagnosis not present

## 2021-02-05 DIAGNOSIS — M6281 Muscle weakness (generalized): Secondary | ICD-10-CM | POA: Diagnosis not present

## 2021-02-05 DIAGNOSIS — M5442 Lumbago with sciatica, left side: Secondary | ICD-10-CM | POA: Diagnosis not present

## 2021-02-05 DIAGNOSIS — M5441 Lumbago with sciatica, right side: Secondary | ICD-10-CM | POA: Diagnosis not present

## 2021-02-05 NOTE — Patient Instructions (Signed)
  [id number]

## 2021-02-05 NOTE — Therapy (Signed)
[place], [place], [address] Phone: [phone]   Fax:  [phone]  Physical Therapy Evaluation  Patient Details  Name: Donald Pope MRN: [id number] Date of Birth: 06-Apr-1946 Referring Provider (PT): Jamse Arn, MD   Encounter Date: 02/05/2021   PT End of Session - 02/05/21 1640     Visit Number 1    Number of Visits 9    Date for PT Re-Evaluation 04/02/21    Authorization Type [employer]    Authorization Time Period FOTO v6,[email or web address]    Progress Note Due on Visit 10    PT Start Time 1400    PT Stop Time 1455    PT Time Calculation (min) 55 min    Activity Tolerance Patient tolerated treatment well;Patient limited by pain    Behavior During Therapy [facility] for tasks assessed/performed             Past Medical History:  Diagnosis Date   Allergy    Anxiety    Back pain    Benign prostatic hypertrophy    possible   Bilateral leg cramps    CAD, NATIVE VESSEL 10/23/2009   Chest pain    DEPRESSION 09/13/2008   Depression    False positive stress test    history of, suggestive of inferior ischemia   GERD 09/13/2008   HYPERLIPIDEMIA 09/13/2008   Meniere's disease    OBSTRUCTIVE SLEEP APNEA 10/23/2009   cpap   RBBB 10/26/2009   [place]   Sciatic nerve pain    Sleep apnea    Cpap   SOB (shortness of breath)    Spinal stenosis of lumbar region 08/18/2012   THROMBOCYTOPENIA 09/13/2008    Past Surgical History:  Procedure Laterality Date   CARDIAC CATHETERIZATION     10 yrs ago   COLONOSCOPY  06/27/2016   MAXIMUM ACCESS (MAS)POSTERIOR LUMBAR INTERBODY FUSION (PLIF) 1 LEVEL N/A 05/27/2013   Procedure: FOR MAXIMUM ACCESS (MAS) POSTERIOR LUMBAR INTERBODY FUSION (PLIF) 1 LEVEL ,LUMBAR FOUR-FIVE;  Surgeon: Eustace Gaede, MD;  Location: [place] [facility];  Service: Neurosurgery;  Laterality: N/A;  FOR MAXIMUM ACCESS (MAS) POSTERIOR LUMBAR INTERBODY FUSION (PLIF) 1 LEVEL ,LUMBAR FOUR-FIVE   NASAL POLYP  EXCISION     TONSILLECTOMY      There were no vitals filed for this visit.    Subjective Assessment - 02/05/21 1403     Subjective Pt reports chronic BIL R>L pain of insidious onset lasting about 15 years. However, in August, he had an injury while playing pickleball when he reports experiencing a R sided muscle pull in his lower back/ gluteals. He reports very minimal back pain and reports that his leg pain is very severe, mostly centralized in his calves, although it is always preceded by bluteal pain. He reports regular tingling in BIL R>L calves, although he denies any numbness. He reports that his pain is most severe with standing and laying flat and is relieved when sitting. He states his pain has gotten mildly better over the past week, but is still unable to go on walks or play pickleball. He states last week, he was able to walk about 400 yards before he felt like he was receiving "electric shock therapy" to his legs. He denies saddle anesthesia, nausea/ vomiting, or unexplained weight change, or change in bowel/ bladder function.    Patient is accompained by: Family member   Wife   Pertinent History L4-L5 fusion in 2015    Limitations  Standing;Walking;Sitting;House hold activities    How long can you sit comfortably? 1 - 1.5 hours    How long can you stand comfortably? 20 minutes    How long can you walk comfortably? 6 minutes    Patient Stated Goals Return to walking, playing pickleball without difficulty    Currently in Pain? Yes    Pain Score 2     Pain Location Buttocks    Pain Orientation Right    Pain Descriptors / Indicators Tingling;Pins and needles    Pain Type Chronic pain    Pain Radiating Towards To BIL calves (not currently)    Pain Onset More than a month ago    Pain Frequency Intermittent    Aggravating Factors  Standing, walking    Pain Relieving Factors Sitting, ice, heat    Effect of Pain on Daily Activities Unable to walk, play pickleball                 [facility] PT Assessment - 02/05/21 0001       Assessment   Medical Diagnosis Chronic right-sided low back pain with right-sided sciatica (M54.41, G89.29), Muscle cramp (R25.2), Piriformis syndrome of right side (G57.01)    Referring Provider (PT) Jamse Arn, MD    Onset Date/Surgical Date 02/05/06    Hand Dominance Right    Next MD Visit None scheduled    Prior Therapy Yes, following lumbar surgery      Precautions   Precautions None      Restrictions   Weight Bearing Restrictions No      Balance Screen   Has the patient fallen in the past 6 months No    Has the patient had a decrease in activity level because of a fear of falling?  No    Is the patient reluctant to leave their home because of a fear of falling?  No      Home Environment   Living Environment Private residence    Type of [place] Access Level entry    Home Layout One level    [place] None      Prior Function   Level of Comfort Retired    Leisure [place], walking      Observation/Other Assessments   Observations Pt demonstrates decreased lumbar lordosis in standing, sitting    Focus on Therapeutic Outcomes (FOTO)  55%, Projected 65% in 11 visits      Functional Tests   Functional tests Squat;Single leg stance      Squat   Comments 50% limited      Single Leg Stance   Comments LOB on R, no toe-touch, but exaggerated arm movements      AROM   Lumbar Flexion 72, pain upon returning to standing    Lumbar Extension 15 pain    Lumbar - Right Side Bend 19cm with peripheralization of pain into calves    Lumbar - Left Side Bend 12cm with peripheralization of pain into calves    Lumbar - Right Rotation 62    Lumbar - Left Rotation 68      Strength   Right Hip Flexion 4/5    Right Hip Extension 3+/5    Right Hip ABduction 3+/5    Left Hip Flexion 4/5    Left Hip Extension 3+/5    Left Hip ABduction 3+/5    Right Knee Flexion 5/5    Right  Knee Extension 5/5  Left Knee Flexion 5/5    Left Knee Extension 5/5      Flexibility   Soft Tissue Assessment /Muscle Length yes    Piriformis WNL on R, mild limitation on L      Transfers   Five time sit to stand comments  8 sec                        Objective measurements completed on examination: See above findings.                PT Education - 02/05/21 1640     Education Details Pt educated on probable underlying pathophysiology behind his pain presentation, proper form with HEP, and POC.    Person(s) Educated Patient;Spouse   Wife, Rosann Auerbach   Methods Explanation;Demonstration;Handout    Comprehension Verbalized understanding;Returned demonstration              PT Short Term Goals - 02/05/21 1712       PT SHORT TERM GOAL #1   Title Pt will report understanding and adherence to his HEP in order to promote independence in the management of his primary impairments.    Baseline HEP provided at eval    Time 4    Period Weeks    Status New    Target Date 03/05/21               PT Long Term Goals - 02/05/21 1743       PT LONG TERM GOAL #1   Title Pt will achieve a FOTO score of 65% in order to demonstrate improved functional ability as it relates to his concordant pain.    Baseline 55%    Time 8    Period Weeks    Status New    Target Date 04/02/21      PT LONG TERM GOAL #2   Title Pt will report ability to walk 30 minutes with 0-2/10 pain in order to return to walking for exercise without limitation.    Baseline pt has severe pain and sxs after 6 minutes of walking    Time 8    Period Weeks    Status New    Target Date 04/02/21      PT LONG TERM GOAL #3   Title Pt will report 50% decrease in LE sxs in order to return to pickleball without limitation.    Baseline -    Time 8    Period Weeks    Status New    Target Date 04/02/21      PT LONG TERM GOAL #4   Title Pt will achieve BIL global strength of 5/5 in order to  progress LE strengthening regimen without limitation.    Baseline See flowsheets    Time 8    Period Weeks    Status New    Target Date 04/02/21                    Plan - 02/05/21 1654     Clinical Impression Statement Pt is a pleasant 75yo M who presents with primary c/o chronic BIL R>L LE pain and tingling associated with mild LBP which was recently exacerbated during a pickleball injury. Upon assessment, his primary impairments include painful and limited lumbar extension and BIL side bend AROM, weak BIL global hip musculature, painful and hypomobile lumbar passive accessories, limited squatting, and decreased lumbar lordosis. These findings, in addition to 5/5 findings for cluster of  [place], indicate high likely of sxs being produced due to lumbar spinal stenosis. The pt will benefit from skilled PT to address his primary impairments and return to his prior level of function without limitation.    Personal Factors and Comorbidities Age;Comorbidity 3+    Comorbidities See medical hx, lumbar spinal fusion L4-L5    Examination-Activity Limitations Squat;Stairs;Stand;Lift;Sleep;Sit;Bend;Locomotion Level    Examination-Participation Restrictions Cleaning;Community Activity;Interpersonal Relationship;Yard Work    [employer]chant [employer] officer Evolving/Moderate complexity    Clinical Decision Making Moderate    Rehab Potential Good    PT Frequency 1x / week    PT Duration 8 weeks    PT Treatment/Interventions ADLs/Self Care Home Management;Cryotherapy;Neuromuscular re-education;Balance training;Therapeutic exercise;Therapeutic activities;Functional mobility training;Stair training;Gait training;Moist Heat;Traction;Taping;Patient/family education;Passive range of motion;Dry needling;Joint Manipulations;Manual techniques    PT Next Visit Plan Progress core strength/ lumbar ROM/ hip strength    PT Home Exercise Plan [id number]    Consulted and Agree with Plan of Care Patient;Family  member/caregiver    Family Member Consulted Wife             Patient will benefit from skilled therapeutic intervention in order to improve the following deficits and impairments:  Decreased range of motion, Difficulty walking, Pain, Hypomobility, Decreased balance, Decreased mobility, Decreased strength, Postural dysfunction  Visit Diagnosis: Chronic bilateral low back pain with bilateral sciatica  Muscle weakness (generalized)  Difficulty in walking, not elsewhere classified     Problem List Patient Active Problem List   Diagnosis Date Noted   Sleep disturbance 08/07/2020   Piriformis syndrome of right side 08/07/2020   Chronic right-sided low back pain with right-sided sciatica 06/26/2020   Muscle cramp 06/26/2020   At risk for activity intolerance 06/05/2020   Sciatic pain, unspecified laterality 06/05/2020   Low HDL (under 40) 04/12/2020   Other hyperlipidemia 03/23/2020   Prediabetes 03/09/2020   Other fatigue 03/09/2020   OSA on CPAP 03/09/2020   Vitamin D deficiency 03/09/2020   Transient elevated blood pressure 03/29/2015   PAD (peripheral artery disease) ([place]) 06/14/2014   S/P lumbar spinal fusion 05/27/2013   Lumbosacral root lesions, not elsewhere classified 08/19/2012   Spinal stenosis of lumbar region 08/18/2012   Pain in soft tissues of limb 04/17/2012   RBBB 10/26/2009   Obstructive sleep apnea 10/23/2009   CAD, NATIVE VESSEL 10/23/2009   Hyperlipidemia 09/13/2008   THROMBOCYTOPENIA 09/13/2008   Mood disorder ([place]) 09/13/2008   GERD 09/13/2008    Vanessa , PT, DPT 02/05/21 5:50 PM   [facility] [address] [place], [place], [address] Phone: [phone]   Fax:  [phone]  Name: Donald Pope MRN: [id number] Date of Birth: 1945-11-01

## 2021-02-14 ENCOUNTER — Other Ambulatory Visit: Payer: Self-pay

## 2021-02-14 ENCOUNTER — Ambulatory Visit: Payer: PPO | Attending: Physical Medicine & Rehabilitation

## 2021-02-14 DIAGNOSIS — M5441 Lumbago with sciatica, right side: Secondary | ICD-10-CM | POA: Diagnosis not present

## 2021-02-14 DIAGNOSIS — M6281 Muscle weakness (generalized): Secondary | ICD-10-CM | POA: Insufficient documentation

## 2021-02-14 DIAGNOSIS — R262 Difficulty in walking, not elsewhere classified: Secondary | ICD-10-CM | POA: Insufficient documentation

## 2021-02-14 DIAGNOSIS — G8929 Other chronic pain: Secondary | ICD-10-CM | POA: Diagnosis not present

## 2021-02-14 DIAGNOSIS — M5442 Lumbago with sciatica, left side: Secondary | ICD-10-CM | POA: Insufficient documentation

## 2021-02-14 NOTE — Patient Instructions (Signed)
  [id number]

## 2021-02-14 NOTE — Therapy (Signed)
[place], [place], [address] Phone: [phone]   Fax:  [phone]  Physical Therapy Treatment  Patient Details  Name: Donald Pope MRN: [id number] Date of Birth: 1946/05/10 Referring Provider (PT): Jamse Arn, MD   Encounter Date: 02/14/2021   PT End of Session - 02/14/21 1337     Visit Number 2    Number of Visits 9    Date for PT Re-Evaluation 04/02/21    Authorization Type [employer]    Authorization Time Period FOTO [email or web address]    Progress Note Due on Visit 10    PT Start Time 1315    PT Stop Time 1400    PT Time Calculation (min) 45 min    Activity Tolerance Patient tolerated treatment well;Patient limited by pain    Behavior During Therapy [facility] for tasks assessed/performed             Past Medical History:  Diagnosis Date   Allergy    Anxiety    Back pain    Benign prostatic hypertrophy    possible   Bilateral leg cramps    CAD, NATIVE VESSEL 10/23/2009   Chest pain    DEPRESSION 09/13/2008   Depression    False positive stress test    history of, suggestive of inferior ischemia   GERD 09/13/2008   HYPERLIPIDEMIA 09/13/2008   Meniere's disease    OBSTRUCTIVE SLEEP APNEA 10/23/2009   cpap   RBBB 10/26/2009   [place]   Sciatic nerve pain    Sleep apnea    Cpap   SOB (shortness of breath)    Spinal stenosis of lumbar region 08/18/2012   THROMBOCYTOPENIA 09/13/2008    Past Surgical History:  Procedure Laterality Date   CARDIAC CATHETERIZATION     10 yrs ago   COLONOSCOPY  06/27/2016   MAXIMUM ACCESS (MAS)POSTERIOR LUMBAR INTERBODY FUSION (PLIF) 1 LEVEL N/A 05/27/2013   Procedure: FOR MAXIMUM ACCESS (MAS) POSTERIOR LUMBAR INTERBODY FUSION (PLIF) 1 LEVEL ,LUMBAR FOUR-FIVE;  Surgeon: Eustace Winner, MD;  Location: [place] [facility];  Service: Neurosurgery;  Laterality: N/A;  FOR MAXIMUM ACCESS (MAS) POSTERIOR LUMBAR INTERBODY FUSION (PLIF) 1 LEVEL ,LUMBAR FOUR-FIVE   NASAL POLYP  EXCISION     TONSILLECTOMY      There were no vitals filed for this visit.   Subjective Assessment - 02/14/21 1315     Subjective Pt reports feeling a little bit better since starting PT, adding that he has done his exercises every day.    Currently in Pain? Yes    Pain Score 3     Pain Location Buttocks    Pain Orientation Right    Pain Descriptors / Indicators Tingling;Pins and needles    Pain Type Chronic pain    Pain Radiating Towards to R calf    Pain Onset More than a month ago    Pain Frequency Intermittent                               [facility] Adult PT Treatment/Exercise - 02/14/21 0001       Lumbar Exercises: Stretches   Lower Trunk Rotation Limitations 2x10 BIL      Lumbar Exercises: Standing   Other Standing Lumbar Exercises Trunk side bend with GTB 3x10 BIL      Lumbar Exercises: Seated   Other Seated Lumbar Exercises lumbar flexion with physioball  Lumbar Exercises: Supine   Bridge with March Other (comment)   3x10 BIL   Other Supine Lumbar Exercises Curl ups with reach 3x15                     PT Education - 02/14/21 1337     Education Details Updated HEP    Person(s) Educated Patient    Methods Explanation;Demonstration;Handout    Comprehension Verbalized understanding;Returned demonstration              PT Short Term Goals - 02/05/21 1712       PT SHORT TERM GOAL #1   Title Pt will report understanding and adherence to his HEP in order to promote independence in the management of his primary impairments.    Baseline HEP provided at eval    Time 4    Period Weeks    Status New    Target Date 03/05/21               PT Long Term Goals - 02/05/21 1743       PT LONG TERM GOAL #1   Title Pt will achieve a FOTO score of 65% in order to demonstrate improved functional ability as it relates to his concordant pain.    Baseline 55%    Time 8    Period Weeks    Status New    Target Date 04/02/21       PT LONG TERM GOAL #2   Title Pt will report ability to walk 30 minutes with 0-2/10 pain in order to return to walking for exercise without limitation.    Baseline pt has severe pain and sxs after 6 minutes of walking    Time 8    Period Weeks    Status New    Target Date 04/02/21      PT LONG TERM GOAL #3   Title Pt will report 50% decrease in LE sxs in order to return to pickleball without limitation.    Baseline -    Time 8    Period Weeks    Status New    Target Date 04/02/21      PT LONG TERM GOAL #4   Title Pt will achieve BIL global strength of 5/5 in order to progress LE strengthening regimen without limitation.    Baseline See flowsheets    Time 8    Period Weeks    Status New    Target Date 04/02/21                   Plan - 02/14/21 1339     Clinical Impression Statement Pt responded well to all interventions today, demonstrating proper form and no increase in pain with selected interventions. However, he did report increased leg symptoms when attempting dead bugs. Due to this, this exercise was terminated after 1 rep. He did not report pain the rest of the session. He will continue to benefit from skilled PT to address his primary impairments and return to his prior level of function with les limitation.    Personal Factors and Comorbidities Age;Comorbidity 3+    Comorbidities See medical hx, lumbar spinal fusion L4-L5    Examination-Activity Limitations Squat;Stairs;Stand;Lift;Sleep;Sit;Bend;Locomotion Level    Examination-Participation Restrictions Cleaning;Community Activity;Interpersonal Relationship;Yard Work    Stability/Clinical Decision Making Evolving/Moderate complexity    Clinical Decision Making Moderate    Rehab Potential Good    PT Frequency 1x / week    PT Duration 8 weeks  PT Treatment/Interventions ADLs/Self Care Home Management;Cryotherapy;Neuromuscular re-education;Balance training;Therapeutic exercise;Therapeutic activities;Functional  mobility training;Stair training;Gait training;Moist Heat;Traction;Taping;Patient/family education;Passive range of motion;Dry needling;Joint Manipulations;Manual techniques    PT Next Visit Plan Progress core strength/ lumbar ROM/ hip strength    PT Home Exercise Plan [id number]    Consulted and Agree with Plan of Care Patient             Patient will benefit from skilled therapeutic intervention in order to improve the following deficits and impairments:  Decreased range of motion, Difficulty walking, Pain, Hypomobility, Decreased balance, Decreased mobility, Decreased strength, Postural dysfunction  Visit Diagnosis: Chronic bilateral low back pain with bilateral sciatica  Muscle weakness (generalized)  Difficulty in walking, not elsewhere classified     Problem List Patient Active Problem List   Diagnosis Date Noted   Sleep disturbance 08/07/2020   Piriformis syndrome of right side 08/07/2020   Chronic right-sided low back pain with right-sided sciatica 06/26/2020   Muscle cramp 06/26/2020   At risk for activity intolerance 06/05/2020   Sciatic pain, unspecified laterality 06/05/2020   Low HDL (under 40) 04/12/2020   Other hyperlipidemia 03/23/2020   Prediabetes 03/09/2020   Other fatigue 03/09/2020   OSA on CPAP 03/09/2020   Vitamin D deficiency 03/09/2020   Transient elevated blood pressure 03/29/2015   PAD (peripheral artery disease) ([place]) 06/14/2014   S/P lumbar spinal fusion 05/27/2013   Lumbosacral root lesions, not elsewhere classified 08/19/2012   Spinal stenosis of lumbar region 08/18/2012   Pain in soft tissues of limb 04/17/2012   RBBB 10/26/2009   Obstructive sleep apnea 10/23/2009   CAD, NATIVE VESSEL 10/23/2009   Hyperlipidemia 09/13/2008   THROMBOCYTOPENIA 09/13/2008   Mood disorder ([place]) 09/13/2008   GERD 09/13/2008    Vanessa Stigler, PT, DPT 02/14/21 2:02 PM   [facility] [address] [place], [place], [address] Phone: [phone]   Fax:  [phone]  Name: Donald Pope MRN: [id number] Date of Birth: 28-Apr-1946

## 2021-02-20 DIAGNOSIS — H938X3 Other specified disorders of ear, bilateral: Secondary | ICD-10-CM | POA: Diagnosis not present

## 2021-02-20 DIAGNOSIS — H8101 Meniere's disease, right ear: Secondary | ICD-10-CM | POA: Diagnosis not present

## 2021-02-20 DIAGNOSIS — Z974 Presence of external hearing-aid: Secondary | ICD-10-CM | POA: Diagnosis not present

## 2021-02-20 DIAGNOSIS — H6123 Impacted cerumen, bilateral: Secondary | ICD-10-CM | POA: Insufficient documentation

## 2021-02-21 DIAGNOSIS — G4733 Obstructive sleep apnea (adult) (pediatric): Secondary | ICD-10-CM | POA: Diagnosis not present

## 2021-02-22 ENCOUNTER — Ambulatory Visit: Payer: PPO

## 2021-02-22 ENCOUNTER — Other Ambulatory Visit: Payer: Self-pay

## 2021-02-22 DIAGNOSIS — M5442 Lumbago with sciatica, left side: Secondary | ICD-10-CM | POA: Diagnosis not present

## 2021-02-22 DIAGNOSIS — M6281 Muscle weakness (generalized): Secondary | ICD-10-CM

## 2021-02-22 DIAGNOSIS — R262 Difficulty in walking, not elsewhere classified: Secondary | ICD-10-CM

## 2021-02-22 DIAGNOSIS — G8929 Other chronic pain: Secondary | ICD-10-CM

## 2021-02-22 NOTE — Patient Instructions (Addendum)
   [id number]

## 2021-02-22 NOTE — Therapy (Signed)
[place], [place], [address] Phone: [phone]   Fax:  [phone]  Physical Therapy Treatment  Patient Details  Name: Donald Pope MRN: [id number] Date of Birth: 01-Jun-1945 Referring Provider (PT): Jamse Arn, MD   Encounter Date: 02/22/2021   PT End of Session - 02/22/21 1330     Visit Number 3    Number of Visits 9    Date for PT Re-Evaluation 04/02/21    Authorization Type [employer]    Authorization Time Period FOTO v6,10    Progress Note Due on Visit 10    PT Start Time 1315    PT Stop Time 1400    PT Time Calculation (min) 45 min    Activity Tolerance Patient tolerated treatment well    Behavior During Therapy [facility] for tasks assessed/performed             Past Medical History:  Diagnosis Date   Allergy    Anxiety    Back pain    Benign prostatic hypertrophy    possible   Bilateral leg cramps    CAD, NATIVE VESSEL 10/23/2009   Chest pain    DEPRESSION 09/13/2008   Depression    False positive stress test    history of, suggestive of inferior ischemia   GERD 09/13/2008   HYPERLIPIDEMIA 09/13/2008   Meniere's disease    OBSTRUCTIVE SLEEP APNEA 10/23/2009   cpap   RBBB 10/26/2009   [place]   Sciatic nerve pain    Sleep apnea    Cpap   SOB (shortness of breath)    Spinal stenosis of lumbar region 08/18/2012   THROMBOCYTOPENIA 09/13/2008    Past Surgical History:  Procedure Laterality Date   CARDIAC CATHETERIZATION     10 yrs ago   COLONOSCOPY  06/27/2016   MAXIMUM ACCESS (MAS)POSTERIOR LUMBAR INTERBODY FUSION (PLIF) 1 LEVEL N/A 05/27/2013   Procedure: FOR MAXIMUM ACCESS (MAS) POSTERIOR LUMBAR INTERBODY FUSION (PLIF) 1 LEVEL ,LUMBAR FOUR-FIVE;  Surgeon: Eustace Rotondo, MD;  Location: [place] [facility];  Service: Neurosurgery;  Laterality: N/A;  FOR MAXIMUM ACCESS (MAS) POSTERIOR LUMBAR INTERBODY FUSION (PLIF) 1 LEVEL ,LUMBAR FOUR-FIVE   NASAL POLYP EXCISION      TONSILLECTOMY      There were no vitals filed for this visit.   Subjective Assessment - 02/22/21 1313     Subjective Pt reports that overall, he has been feeling a little bit better, but last night, his right leg was "really giving me some trouble last night." He reports it is slightly better today. He adds that he has been doing deep water aerobics and yoga classes 1x/week each at the [facility]. He also reports being adherent to his HEP.    Pertinent History L4-L5 fusion in 2015    Limitations Standing;Walking;Sitting;House hold activities    Currently in Pain? Yes    Pain Score 4     Pain Location Leg    Pain Orientation Right    Pain Descriptors / Indicators Pins and needles;Tingling;Shooting    Pain Type Chronic pain                               [facility] Adult PT Treatment/Exercise - 02/22/21 0001       Lumbar Exercises: Stretches   Other Lumbar Stretch Exercise Open book 2x10 BIL    Other Lumbar Stretch Exercise Hip flexor stretch with leg off side  of table x1 minute each side      Lumbar Exercises: Standing   Other Standing Lumbar Exercises Pallof press with 7# cable 2x10 BIL      Lumbar Exercises: Seated   Other Seated Lumbar Exercises Sciatic nerve glide on R x20      Lumbar Exercises: Supine   Other Supine Lumbar Exercises Curl ups with heel taps 3x15      Modalities   Modalities Traction      Traction   Type of Traction Lumbar    Min (lbs) 75    Max (lbs) 90    Hold Time 60 seconds    Rest Time 20 seconds    Time 15 minutes                     PT Education - 02/22/21 1338     Education Details Updated HEP, discussed benefits, precautions, and adverse events with mechanical lumbar traction prior to administering intervention.    Person(s) Educated Patient    Methods Explanation;Demonstration;Handout    Comprehension Verbalized understanding;Returned demonstration              PT Short Term Goals - 02/05/21 1712       PT  SHORT TERM GOAL #1   Title Pt will report understanding and adherence to his HEP in order to promote independence in the management of his primary impairments.    Baseline HEP provided at eval    Time 4    Period Weeks    Status New    Target Date 03/05/21               PT Long Term Goals - 02/05/21 1743       PT LONG TERM GOAL #1   Title Pt will achieve a FOTO score of 65% in order to demonstrate improved functional ability as it relates to his concordant pain.    Baseline 55%    Time 8    Period Weeks    Status New    Target Date 04/02/21      PT LONG TERM GOAL #2   Title Pt will report ability to walk 30 minutes with 0-2/10 pain in order to return to walking for exercise without limitation.    Baseline pt has severe pain and sxs after 6 minutes of walking    Time 8    Period Weeks    Status New    Target Date 04/02/21      PT LONG TERM GOAL #3   Title Pt will report 50% decrease in LE sxs in order to return to pickleball without limitation.    Baseline -    Time 8    Period Weeks    Status New    Target Date 04/02/21      PT LONG TERM GOAL #4   Title Pt will achieve BIL global strength of 5/5 in order to progress LE strengthening regimen without limitation.    Baseline See flowsheets    Time 8    Period Weeks    Status New    Target Date 04/02/21                   Plan - 02/22/21 1346     Clinical Impression Statement Pt responded well to all interventions today, demonstrating good form and no increase in pain with selected exercises. Also of note, he reports feeling more limber following mechanical lumbar traction, although his pain was unchanged.  Following sciatic nerve glides, however, he reports a slight decrease in pain from 4/10 to 3/10. He will continue to benefit from skilled PT to address his primary impairments and return to his prior level of function without limitation due to pain.    Personal Factors and Comorbidities Age;Comorbidity 3+     Comorbidities See medical hx, lumbar spinal fusion L4-L5    Examination-Activity Limitations Squat;Stairs;Stand;Lift;Sleep;Sit;Bend;Locomotion Level    Examination-Participation Restrictions Cleaning;Community Activity;Interpersonal Relationship;Yard Work    [employer] Evolving/Moderate complexity    Clinical Decision Making Moderate    Rehab Potential Good    PT Frequency 1x / week    PT Duration 8 weeks    PT Treatment/Interventions ADLs/Self Care Home Management;Cryotherapy;Neuromuscular re-education;Balance training;Therapeutic exercise;Therapeutic activities;Functional mobility training;Stair training;Gait training;Moist Heat;Traction;Taping;Patient/family education;Passive range of motion;Dry needling;Joint Manipulations;Manual techniques    PT Next Visit Plan Progress core strength/ lumbar ROM/ hip strength, asses pt response to lumbar traction    PT Home Exercise Plan [id number]    Consulted and Agree with Plan of Care Patient             Patient will benefit from skilled therapeutic intervention in order to improve the following deficits and impairments:  Decreased range of motion, Difficulty walking, Pain, Hypomobility, Decreased balance, Decreased mobility, Decreased strength, Postural dysfunction  Visit Diagnosis: Chronic bilateral low back pain with bilateral sciatica  Muscle weakness (generalized)  Difficulty in walking, not elsewhere classified     Problem List Patient Active Problem List   Diagnosis Date Noted   Sleep disturbance 08/07/2020   Piriformis syndrome of right side 08/07/2020   Chronic right-sided low back pain with right-sided sciatica 06/26/2020   Muscle cramp 06/26/2020   At risk for activity intolerance 06/05/2020   Sciatic pain, unspecified laterality 06/05/2020   Low HDL (under 40) 04/12/2020   Other hyperlipidemia 03/23/2020   Prediabetes 03/09/2020   Other fatigue 03/09/2020   OSA on CPAP 03/09/2020   Vitamin D  deficiency 03/09/2020   Transient elevated blood pressure 03/29/2015   PAD (peripheral artery disease) ([place]) 06/14/2014   S/P lumbar spinal fusion 05/27/2013   Lumbosacral root lesions, not elsewhere classified 08/19/2012   Spinal stenosis of lumbar region 08/18/2012   Pain in soft tissues of limb 04/17/2012   RBBB 10/26/2009   Obstructive sleep apnea 10/23/2009   CAD, NATIVE VESSEL 10/23/2009   Hyperlipidemia 09/13/2008   THROMBOCYTOPENIA 09/13/2008   Mood disorder ([place]) 09/13/2008   GERD 09/13/2008    Vanessa Adair, PT, DPT 02/22/21 2:11 PM   [facility] [address] [place], [place], [address] Phone: [phone]   Fax:  [phone]  Name: Donald Pope MRN: [id number] Date of Birth: 15-Aug-1945

## 2021-02-28 ENCOUNTER — Other Ambulatory Visit: Payer: Self-pay

## 2021-02-28 ENCOUNTER — Ambulatory Visit: Payer: PPO

## 2021-02-28 DIAGNOSIS — G8929 Other chronic pain: Secondary | ICD-10-CM

## 2021-02-28 DIAGNOSIS — M6281 Muscle weakness (generalized): Secondary | ICD-10-CM

## 2021-02-28 DIAGNOSIS — R262 Difficulty in walking, not elsewhere classified: Secondary | ICD-10-CM

## 2021-02-28 DIAGNOSIS — G4733 Obstructive sleep apnea (adult) (pediatric): Secondary | ICD-10-CM | POA: Diagnosis not present

## 2021-02-28 DIAGNOSIS — M5442 Lumbago with sciatica, left side: Secondary | ICD-10-CM | POA: Diagnosis not present

## 2021-02-28 NOTE — Therapy (Signed)
[place], [place], [address] Phone: [phone]   Fax:  [phone]  Physical Therapy Treatment  Patient Details  Name: Donald Pope MRN: [id number] Date of Birth: 08-Aug-1945 Referring Provider (PT): Jamse Arn, MD   Encounter Date: 02/28/2021   PT End of Session - 02/28/21 1326     Visit Number 4    Number of Visits 9    Date for PT Re-Evaluation 04/02/21    Authorization Type [employer]    Authorization Time Period FOTO v6,10    Progress Note Due on Visit 10    PT Start Time 1310    PT Stop Time 1355    PT Time Calculation (min) 45 min    Activity Tolerance Patient tolerated treatment well    Behavior During Therapy [facility] for tasks assessed/performed             Past Medical History:  Diagnosis Date   Allergy    Anxiety    Back pain    Benign prostatic hypertrophy    possible   Bilateral leg cramps    CAD, NATIVE VESSEL 10/23/2009   Chest pain    DEPRESSION 09/13/2008   Depression    False positive stress test    history of, suggestive of inferior ischemia   GERD 09/13/2008   HYPERLIPIDEMIA 09/13/2008   Meniere's disease    OBSTRUCTIVE SLEEP APNEA 10/23/2009   cpap   RBBB 10/26/2009   [place]   Sciatic nerve pain    Sleep apnea    Cpap   SOB (shortness of breath)    Spinal stenosis of lumbar region 08/18/2012   THROMBOCYTOPENIA 09/13/2008    Past Surgical History:  Procedure Laterality Date   CARDIAC CATHETERIZATION     10 yrs ago   COLONOSCOPY  06/27/2016   MAXIMUM ACCESS (MAS)POSTERIOR LUMBAR INTERBODY FUSION (PLIF) 1 LEVEL N/A 05/27/2013   Procedure: FOR MAXIMUM ACCESS (MAS) POSTERIOR LUMBAR INTERBODY FUSION (PLIF) 1 LEVEL ,LUMBAR FOUR-FIVE;  Surgeon: Eustace Socorro, MD;  Location: [facility];  Service: Neurosurgery;  Laterality: N/A;  FOR MAXIMUM ACCESS (MAS) POSTERIOR LUMBAR INTERBODY FUSION (PLIF) 1 LEVEL ,LUMBAR FOUR-FIVE   NASAL POLYP EXCISION      TONSILLECTOMY      There were no vitals filed for this visit.   Subjective Assessment - 02/28/21 1307     Subjective Pt reports feeling well today, although he reports he has been having "good days and bad days." He reports doing his HEP every day, along with water aerobics and yoga once per week.    Currently in Pain? Yes    Pain Score 2     Pain Location Leg    Pain Orientation Left;Right    Pain Descriptors / Indicators Sore    Pain Type Chronic pain    Pain Onset More than a month ago    Pain Frequency Intermittent                               [facility] Adult PT Treatment/Exercise - 02/28/21 0001       Lumbar Exercises: Stretches   Double Knee to Chest Stretch Limitations 3x30sec in-between bird-dog sets      Lumbar Exercises: Machines for Strengthening   Other Lumbar Machine Exercise Cybex bent-knee hip flexion with slow, eccentric lowering 3x10 BIL with 25#      Lumbar Exercises: Standing   Other Standing  Lumbar Exercises Alternating diagonal abdominal pressdown with 10# cable 2x10 BIL    Other Standing Lumbar Exercises Rhythmic repeating trunk flexion working from about 30d of flexion to 60d of flexion 3x15 in-between abdominla pressdown sets      Lumbar Exercises: Sidelying   Other Sidelying Lumbar Exercises Side knee plank 3x30sec BIL      Lumbar Exercises: Quadruped   Other Quadruped Lumbar Exercises Birddogs with elbow-knee taps with 2# ankle weights on ankles 3x10 BIL                       PT Short Term Goals - 02/05/21 1712       PT SHORT TERM GOAL #1   Title Pt will report understanding and adherence to his HEP in order to promote independence in the management of his primary impairments.    Baseline HEP provided at eval    Time 4    Period Weeks    Status New    Target Date 03/05/21               PT Long Term Goals - 02/05/21 1743       PT LONG TERM GOAL #1   Title Pt will achieve a FOTO score of 65% in order  to demonstrate improved functional ability as it relates to his concordant pain.    Baseline 55%    Time 8    Period Weeks    Status New    Target Date 04/02/21      PT LONG TERM GOAL #2   Title Pt will report ability to walk 30 minutes with 0-2/10 pain in order to return to walking for exercise without limitation.    Baseline pt has severe pain and sxs after 6 minutes of walking    Time 8    Period Weeks    Status New    Target Date 04/02/21      PT LONG TERM GOAL #3   Title Pt will report 50% decrease in LE sxs in order to return to pickleball without limitation.    Baseline -    Time 8    Period Weeks    Status New    Target Date 04/02/21      PT LONG TERM GOAL #4   Title Pt will achieve BIL global strength of 5/5 in order to progress LE strengthening regimen without limitation.    Baseline See flowsheets    Time 8    Period Weeks    Status New    Target Date 04/02/21                   Plan - 02/28/21 1328     Clinical Impression Statement Pt responded well to all interventions today, demonstrating good form and no increase in pain with selected exercises. The pt reports feeling great following repeated trunk flexion, reporting decrease of pain from 4/10 to 2/10. He also demonstrates improved functional core strength as indicated by his ability to perform more advanced core strengthening exercises without difficulty. He will continue to benefit from skilled PT to address his primary impairments and return to his prior level of function without limitation due to pain.    Personal Factors and Comorbidities Age;Comorbidity 3+    Comorbidities See medical hx, lumbar spinal fusion L4-L5    Examination-Activity Limitations Squat;Stairs;Stand;Lift;Sleep;Sit;Bend;Locomotion Level    Examination-Participation Restrictions Cleaning;Community Activity;Interpersonal Relationship;Yard Work    [employer] Evolving/Moderate complexity    Clinical  Decision  Making Moderate    Rehab Potential Good    PT Frequency 1x / week    PT Duration 8 weeks    PT Treatment/Interventions ADLs/Self Care Home Management;Cryotherapy;Neuromuscular re-education;Balance training;Therapeutic exercise;Therapeutic activities;Functional mobility training;Stair training;Gait training;Moist Heat;Traction;Taping;Patient/family education;Passive range of motion;Dry needling;Joint Manipulations;Manual techniques    PT Next Visit Plan Progress core strength/ lumbar ROM/ hip strength    PT Home Exercise Plan [id number]    Consulted and Agree with Plan of Care Patient             Patient will benefit from skilled therapeutic intervention in order to improve the following deficits and impairments:  Decreased range of motion, Difficulty walking, Pain, Hypomobility, Decreased balance, Decreased mobility, Decreased strength, Postural dysfunction  Visit Diagnosis: Chronic bilateral low back pain with bilateral sciatica  Muscle weakness (generalized)  Difficulty in walking, not elsewhere classified     Problem List Patient Active Problem List   Diagnosis Date Noted   Sleep disturbance 08/07/2020   Piriformis syndrome of right side 08/07/2020   Chronic right-sided low back pain with right-sided sciatica 06/26/2020   Muscle cramp 06/26/2020   At risk for activity intolerance 06/05/2020   Sciatic pain, unspecified laterality 06/05/2020   Low HDL (under 40) 04/12/2020   Other hyperlipidemia 03/23/2020   Prediabetes 03/09/2020   Other fatigue 03/09/2020   OSA on CPAP 03/09/2020   Vitamin D deficiency 03/09/2020   Transient elevated blood pressure 03/29/2015   PAD (peripheral artery disease) ([place]) 06/14/2014   S/P lumbar spinal fusion 05/27/2013   Lumbosacral root lesions, not elsewhere classified 08/19/2012   Spinal stenosis of lumbar region 08/18/2012   Pain in soft tissues of limb 04/17/2012   RBBB 10/26/2009   Obstructive sleep apnea 10/23/2009   CAD, NATIVE  VESSEL 10/23/2009   Hyperlipidemia 09/13/2008   THROMBOCYTOPENIA 09/13/2008   Mood disorder ([place]) 09/13/2008   GERD 09/13/2008    Vanessa Atwater, PT, DPT 02/28/21 1:58 PM   [facility] [address] [place], [place], [address] Phone: [phone]   Fax:  [phone]  Name: VERE DIANTONIO MRN: [id number] Date of Birth: Dec 11, 1945

## 2021-03-07 ENCOUNTER — Other Ambulatory Visit: Payer: Self-pay | Admitting: Family Medicine

## 2021-03-07 ENCOUNTER — Other Ambulatory Visit: Payer: Self-pay | Admitting: Physical Medicine & Rehabilitation

## 2021-03-08 ENCOUNTER — Other Ambulatory Visit: Payer: Self-pay

## 2021-03-08 ENCOUNTER — Ambulatory Visit: Payer: PPO

## 2021-03-08 DIAGNOSIS — M5442 Lumbago with sciatica, left side: Secondary | ICD-10-CM

## 2021-03-08 DIAGNOSIS — M6281 Muscle weakness (generalized): Secondary | ICD-10-CM

## 2021-03-08 DIAGNOSIS — R262 Difficulty in walking, not elsewhere classified: Secondary | ICD-10-CM

## 2021-03-08 DIAGNOSIS — G8929 Other chronic pain: Secondary | ICD-10-CM

## 2021-03-08 NOTE — Therapy (Signed)
[place], [place], [address] Phone: [phone]   Fax:  [phone]  Physical Therapy Treatment  Patient Details  Name: Donald Pope MRN: [id number] Date of Birth: 1946/04/17 Referring Provider (PT): Jamse Arn, MD   Encounter Date: 03/08/2021   PT End of Session - 03/08/21 1631     Visit Number 5    Number of Visits 9    Date for PT Re-Evaluation 04/02/21    Authorization Type [employer]    Authorization Time Period FOTO v6,10    Progress Note Due on Visit 10    PT Start Time 1615    PT Stop Time 1655    PT Time Calculation (min) 40 min    Activity Tolerance Patient tolerated treatment well    Behavior During Therapy [facility] for tasks assessed/performed             Past Medical History:  Diagnosis Date   Allergy    Anxiety    Back pain    Benign prostatic hypertrophy    possible   Bilateral leg cramps    CAD, NATIVE VESSEL 10/23/2009   Chest pain    DEPRESSION 09/13/2008   Depression    False positive stress test    history of, suggestive of inferior ischemia   GERD 09/13/2008   HYPERLIPIDEMIA 09/13/2008   Meniere's disease    OBSTRUCTIVE SLEEP APNEA 10/23/2009   cpap   RBBB 10/26/2009   [place]   Sciatic nerve pain    Sleep apnea    Cpap   SOB (shortness of breath)    Spinal stenosis of lumbar region 08/18/2012   THROMBOCYTOPENIA 09/13/2008    Past Surgical History:  Procedure Laterality Date   CARDIAC CATHETERIZATION     10 yrs ago   COLONOSCOPY  06/27/2016   MAXIMUM ACCESS (MAS)POSTERIOR LUMBAR INTERBODY FUSION (PLIF) 1 LEVEL N/A 05/27/2013   Procedure: FOR MAXIMUM ACCESS (MAS) POSTERIOR LUMBAR INTERBODY FUSION (PLIF) 1 LEVEL ,LUMBAR FOUR-FIVE;  Surgeon: Eustace Dantuono, MD;  Location: [place] [facility];  Service: Neurosurgery;  Laterality: N/A;  FOR MAXIMUM ACCESS (MAS) POSTERIOR LUMBAR INTERBODY FUSION (PLIF) 1 LEVEL ,LUMBAR FOUR-FIVE   NASAL POLYP EXCISION      TONSILLECTOMY      There were no vitals filed for this visit.   Subjective Assessment - 03/08/21 1614     Subjective Pt reports that he had increased sxs this morning, although his pain has improved through the day. He reports continued adherence to his HEP, adding that his exercises have not been painful.    Currently in Pain? Yes    Pain Score 3     Pain Location Leg    Pain Orientation Right;Lower;Lateral    Pain Descriptors / Indicators Aching    Pain Type Chronic pain                               [facility] Adult PT Treatment/Exercise - 03/08/21 0001       Lumbar Exercises: Stretches   Double Knee to Chest Stretch Limitations 3x30sec in-between bird-dog sets      Lumbar Exercises: Machines for Strengthening   Other Lumbar Machine Exercise Cybex bent-knee hip flexion with slow, eccentric lowering 3x10 BIL with 25#    Other Lumbar Machine Exercise Cybex hip abduction 2x10 BIL with 12.5#      Lumbar Exercises: Standing   Side Lunge Limitations Trunk side  bend with 13# cable 2x10 BIL    Other Standing Lumbar Exercises Alternating diagonal abdominal pressdown with 17# cable 2x10 BIL    Other Standing Lumbar Exercises Rhythmic repeating trunk flexion working from about 30d of flexion to 60d of flexion 3x15 in-between abdominla pressdown sets      Lumbar Exercises: Seated   Other Seated Lumbar Exercises Sciatic nerve glide BIL x20      Lumbar Exercises: Quadruped   Other Quadruped Lumbar Exercises Birddogs with elbow-knee taps with 4# ankle weights on ankles 3x10 BIL                       PT Short Term Goals - 02/05/21 1712       PT SHORT TERM GOAL #1   Title Pt will report understanding and adherence to his HEP in order to promote independence in the management of his primary impairments.    Baseline HEP provided at eval    Time 4    Period Weeks    Status New    Target Date 03/05/21               PT Long Term Goals - 02/05/21 1743        PT LONG TERM GOAL #1   Title Pt will achieve a FOTO score of 65% in order to demonstrate improved functional ability as it relates to his concordant pain.    Baseline 55%    Time 8    Period Weeks    Status New    Target Date 04/02/21      PT LONG TERM GOAL #2   Title Pt will report ability to walk 30 minutes with 0-2/10 pain in order to return to walking for exercise without limitation.    Baseline pt has severe pain and sxs after 6 minutes of walking    Time 8    Period Weeks    Status New    Target Date 04/02/21      PT LONG TERM GOAL #3   Title Pt will report 50% decrease in LE sxs in order to return to pickleball without limitation.    Baseline -    Time 8    Period Weeks    Status New    Target Date 04/02/21      PT LONG TERM GOAL #4   Title Pt will achieve BIL global strength of 5/5 in order to progress LE strengthening regimen without limitation.    Baseline See flowsheets    Time 8    Period Weeks    Status New    Target Date 04/02/21                   Plan - 03/08/21 1632     Clinical Impression Statement Pt responded well to all interventions today, demonstrating good form and no increase in pain with selected exercises. The pt continues to report improved pain following repeated trunk flexion, reporting decrease of pain from 3/10 to 1/10. He also demonstrates improved functional core strength as indicated by his ability to increase resistance with previously performed exercises. He will continue to benefit from skilled PT to address his primary impairments and return to his prior level of function without limitation due to pain.    Personal Factors and Comorbidities Age;Comorbidity 3+    Comorbidities See medical hx, lumbar spinal fusion L4-L5    Examination-Activity Limitations Squat;Stairs;Stand;Lift;Sleep;Sit;Bend;Locomotion Level    Examination-Participation Restrictions Cleaning;Community Activity;Interpersonal Relationship;[employer]aver Work  Stability/Clinical Decision Making Evolving/Moderate complexity    Clinical Decision Making Moderate    Rehab Potential Good    PT Frequency 1x / week    PT Duration 8 weeks    PT Treatment/Interventions ADLs/Self Care Home Management;Cryotherapy;Neuromuscular re-education;Balance training;Therapeutic exercise;Therapeutic activities;Functional mobility training;Stair training;Gait training;Moist Heat;Traction;Taping;Patient/family education;Passive range of motion;Dry needling;Joint Manipulations;Manual techniques    PT Next Visit Plan Progress core strength/ lumbar ROM/ hip strength    PT Home Exercise Plan [id number]    Consulted and Agree with Plan of Care Patient             Patient will benefit from skilled therapeutic intervention in order to improve the following deficits and impairments:  Decreased range of motion, Difficulty walking, Pain, Hypomobility, Decreased balance, Decreased mobility, Decreased strength, Postural dysfunction  Visit Diagnosis: Chronic bilateral low back pain with bilateral sciatica  Muscle weakness (generalized)  Difficulty in walking, not elsewhere classified     Problem List Patient Active Problem List   Diagnosis Date Noted   Sleep disturbance 08/07/2020   Piriformis syndrome of right side 08/07/2020   Chronic right-sided low back pain with right-sided sciatica 06/26/2020   Muscle cramp 06/26/2020   At risk for activity intolerance 06/05/2020   Sciatic pain, unspecified laterality 06/05/2020   Low HDL (under 40) 04/12/2020   Other hyperlipidemia 03/23/2020   Prediabetes 03/09/2020   Other fatigue 03/09/2020   OSA on CPAP 03/09/2020   Vitamin D deficiency 03/09/2020   Transient elevated blood pressure 03/29/2015   PAD (peripheral artery disease) ([place]) 06/14/2014   S/P lumbar spinal fusion 05/27/2013   Lumbosacral root lesions, not elsewhere classified 08/19/2012   Spinal stenosis of lumbar region 08/18/2012   Pain in soft tissues of limb  04/17/2012   RBBB 10/26/2009   Obstructive sleep apnea 10/23/2009   CAD, NATIVE VESSEL 10/23/2009   Hyperlipidemia 09/13/2008   THROMBOCYTOPENIA 09/13/2008   Mood disorder ([place]) 09/13/2008   GERD 09/13/2008    Vanessa Cade, PT, DPT 03/08/21 4:56 PM   [facility] [address] [place], [place], [address] Phone: [phone]   Fax:  [phone]  Name: Donald Pope MRN: [id number] Date of Birth: [birth year]

## 2021-03-09 ENCOUNTER — Other Ambulatory Visit: Payer: Self-pay | Admitting: Family Medicine

## 2021-03-19 ENCOUNTER — Ambulatory Visit: Payer: PPO | Admitting: Interventional Cardiology

## 2021-03-21 ENCOUNTER — Ambulatory Visit: Payer: PPO | Attending: Physical Medicine & Rehabilitation

## 2021-03-21 ENCOUNTER — Other Ambulatory Visit: Payer: Self-pay

## 2021-03-21 DIAGNOSIS — G8929 Other chronic pain: Secondary | ICD-10-CM | POA: Insufficient documentation

## 2021-03-21 DIAGNOSIS — M6281 Muscle weakness (generalized): Secondary | ICD-10-CM | POA: Insufficient documentation

## 2021-03-21 DIAGNOSIS — R262 Difficulty in walking, not elsewhere classified: Secondary | ICD-10-CM | POA: Diagnosis not present

## 2021-03-21 DIAGNOSIS — M5442 Lumbago with sciatica, left side: Secondary | ICD-10-CM | POA: Insufficient documentation

## 2021-03-21 DIAGNOSIS — M5441 Lumbago with sciatica, right side: Secondary | ICD-10-CM | POA: Diagnosis not present

## 2021-03-21 NOTE — Therapy (Signed)
[place], [place], [address] Phone: [phone]   Fax:  [phone]  Physical Therapy Treatment  Patient Details  Name: Donald Pope MRN: [id number] Date of Birth: 1945/10/12 Referring Provider (PT): Jamse Arn, MD   Encounter Date: 03/21/2021   PT End of Session - 03/21/21 1208     Visit Number 6    Number of Visits 9    Date for PT Re-Evaluation 04/02/21    Authorization Type [employer]    Authorization Time Period FOTO v6,10    Progress Note Due on Visit 10    PT Start Time 1210    PT Stop Time 1258    PT Time Calculation (min) 48 min    Activity Tolerance Patient tolerated treatment well    Behavior During Therapy [facility] for tasks assessed/performed             Past Medical History:  Diagnosis Date   Allergy    Anxiety    Back pain    Benign prostatic hypertrophy    possible   Bilateral leg cramps    CAD, NATIVE VESSEL 10/23/2009   Chest pain    DEPRESSION 09/13/2008   Depression    False positive stress test    history of, suggestive of inferior ischemia   GERD 09/13/2008   HYPERLIPIDEMIA 09/13/2008   Meniere's disease    OBSTRUCTIVE SLEEP APNEA 10/23/2009   cpap   RBBB 10/26/2009   [place]   Sciatic nerve pain    Sleep apnea    Cpap   SOB (shortness of breath)    Spinal stenosis of lumbar region 08/18/2012   THROMBOCYTOPENIA 09/13/2008    Past Surgical History:  Procedure Laterality Date   CARDIAC CATHETERIZATION     10 yrs ago   COLONOSCOPY  06/27/2016   MAXIMUM ACCESS (MAS)POSTERIOR LUMBAR INTERBODY FUSION (PLIF) 1 LEVEL N/A 05/27/2013   Procedure: FOR MAXIMUM ACCESS (MAS) POSTERIOR LUMBAR INTERBODY FUSION (PLIF) 1 LEVEL ,LUMBAR FOUR-FIVE;  Surgeon: Eustace Oceguera, MD;  Location: [place] [facility];  Service: Neurosurgery;  Laterality: N/A;  FOR MAXIMUM ACCESS (MAS) POSTERIOR LUMBAR INTERBODY FUSION (PLIF) 1 LEVEL ,LUMBAR FOUR-FIVE   NASAL POLYP EXCISION      TONSILLECTOMY      There were no vitals filed for this visit.   Subjective Assessment - 03/21/21 1209     Subjective Pt presents to PT with reports of L LE pain this morning. He also notes 1/10 R LE pain, continued paresthesias down both LEs. He has been compliant with HEP with no adverse effect. Pt is ready to begin PT treatment at this time.    Currently in Pain? Yes    Pain Score 2     Pain Location Leg    Pain Orientation Left;Posterior;Lateral           [facility] Adult PT Treatment/Exercise:   Therapeutic Exercise:  NuStep lvl 7 UE/LE x 4 min while taking subjective STS 3x10 - 15lb KB Supine sciatic nerve glide x 20 ea Supine PPT x 10 - 5 sec hold 90/90 table top hold 3x30 sec 90/90 alt taps 2x10 Bridge 2x15 - 3 sec Fwd physioball rollouts 2x10 Bird dog 2x10 - 5 sec hold                             PT Education - 03/21/21 1350     Education Details HEP update  Person(s) Educated Patient    Methods Explanation;Demonstration;Handout    Comprehension Verbalized understanding;Returned demonstration              PT Short Term Goals - 02/05/21 1712       PT SHORT TERM GOAL #1   Title Pt will report understanding and adherence to his HEP in order to promote independence in the management of his primary impairments.    Baseline HEP provided at eval    Time 4    Period Weeks    Status New    Target Date 03/05/21               PT Long Term Goals - 02/05/21 1743       PT LONG TERM GOAL #1   Title Pt will achieve a FOTO score of 65% in order to demonstrate improved functional ability as it relates to his concordant pain.    Baseline 55%    Time 8    Period Weeks    Status New    Target Date 04/02/21      PT LONG TERM GOAL #2   Title Pt will report ability to walk 30 minutes with 0-2/10 pain in order to return to walking for exercise without limitation.    Baseline pt has severe pain and sxs after 6 minutes of walking    Time 8     Period Weeks    Status New    Target Date 04/02/21      PT LONG TERM GOAL #3   Title Pt will report 50% decrease in LE sxs in order to return to pickleball without limitation.    Baseline -    Time 8    Period Weeks    Status New    Target Date 04/02/21      PT LONG TERM GOAL #4   Title Pt will achieve BIL global strength of 5/5 in order to progress LE strengthening regimen without limitation.    Baseline See flowsheets    Time 8    Period Weeks    Status New    Target Date 04/02/21                   Plan - 03/21/21 1246     Clinical Impression Statement Pt was able to complete prescribed exercises with no adverse effect or change in baseline. Seemed to prefer trunk flexion for decrease in neural symptoms, along with reduction in pain/paresthesias after sciatic nerve glides. HEP updated today and pt continues to benefit from skilled PT services working on improve core and proximal hip strength. Will continue to progress as tolerated per POC.    PT Treatment/Interventions ADLs/Self Care Home Management;Cryotherapy;Neuromuscular re-education;Balance training;Therapeutic exercise;Therapeutic activities;Functional mobility training;Stair training;Gait training;Moist Heat;Traction;Taping;Patient/family education;Passive range of motion;Dry needling;Joint Manipulations;Manual techniques    PT Next Visit Plan DN to lumbar multifidi, progress core as able    PT Home Exercise Plan Access Code: [facility]             Patient will benefit from skilled therapeutic intervention in order to improve the following deficits and impairments:  Decreased range of motion, Difficulty walking, Pain, Hypomobility, Decreased balance, Decreased mobility, Decreased strength, Postural dysfunction  Visit Diagnosis: Chronic bilateral low back pain with bilateral sciatica  Muscle weakness (generalized)  Difficulty in walking, not elsewhere classified     Problem List Patient Active  Problem List   Diagnosis Date Noted   Sleep disturbance 08/07/2020   Piriformis syndrome of right  side 08/07/2020   Chronic right-sided low back pain with right-sided sciatica 06/26/2020   Muscle cramp 06/26/2020   At risk for activity intolerance 06/05/2020   Sciatic pain, unspecified laterality 06/05/2020   Low HDL (under 40) 04/12/2020   Other hyperlipidemia 03/23/2020   Prediabetes 03/09/2020   Other fatigue 03/09/2020   OSA on CPAP 03/09/2020   Vitamin D deficiency 03/09/2020   Transient elevated blood pressure 03/29/2015   PAD (peripheral artery disease) ([place]) 06/14/2014   S/P lumbar spinal fusion 05/27/2013   Lumbosacral root lesions, not elsewhere classified 08/19/2012   Spinal stenosis of lumbar region 08/18/2012   Pain in soft tissues of limb 04/17/2012   RBBB 10/26/2009   Obstructive sleep apnea 10/23/2009   CAD, NATIVE VESSEL 10/23/2009   Hyperlipidemia 09/13/2008   THROMBOCYTOPENIA 09/13/2008   Mood disorder ([place]) 09/13/2008   GERD 09/13/2008    Ward Chatters, PT 03/21/2021, 1:51 PM  [facility] [address] [place], [place], [address] Phone: [phone]   Fax:  [phone]  Name: CHESKY HEYER MRN: [id number] Date of Birth: 1945-06-04

## 2021-03-28 ENCOUNTER — Other Ambulatory Visit: Payer: Self-pay

## 2021-03-28 ENCOUNTER — Ambulatory Visit: Payer: PPO

## 2021-03-28 DIAGNOSIS — G8929 Other chronic pain: Secondary | ICD-10-CM

## 2021-03-28 DIAGNOSIS — M5442 Lumbago with sciatica, left side: Secondary | ICD-10-CM | POA: Diagnosis not present

## 2021-03-28 DIAGNOSIS — M6281 Muscle weakness (generalized): Secondary | ICD-10-CM

## 2021-03-28 DIAGNOSIS — R262 Difficulty in walking, not elsewhere classified: Secondary | ICD-10-CM

## 2021-03-28 NOTE — Therapy (Signed)
[place], [place], [address] Phone: [phone]   Fax:  [phone]  Physical Therapy Treatment  Patient Details  Name: Donald Pope MRN: [id number] Date of Birth: 1946-03-07 Referring Provider (PT): Jamse Arn, MD   Encounter Date: 03/28/2021   PT End of Session - 03/28/21 1528     Visit Number 7    Number of Visits 9    Date for PT Re-Evaluation 04/02/21    Authorization Type [employer]    Authorization Time Period FOTO v6,10    Progress Note Due on Visit 10    PT Start Time 1530    PT Stop Time 1614   37min TPDN   PT Time Calculation (min) 44 min    Activity Tolerance Patient tolerated treatment well    Behavior During Therapy [facility] for tasks assessed/performed             Past Medical History:  Diagnosis Date   Allergy    Anxiety    Back pain    Benign prostatic hypertrophy    possible   Bilateral leg cramps    CAD, NATIVE VESSEL 10/23/2009   Chest pain    DEPRESSION 09/13/2008   Depression    False positive stress test    history of, suggestive of inferior ischemia   GERD 09/13/2008   HYPERLIPIDEMIA 09/13/2008   Meniere's disease    OBSTRUCTIVE SLEEP APNEA 10/23/2009   cpap   RBBB 10/26/2009   [place]   Sciatic nerve pain    Sleep apnea    Cpap   SOB (shortness of breath)    Spinal stenosis of lumbar region 08/18/2012   THROMBOCYTOPENIA 09/13/2008    Past Surgical History:  Procedure Laterality Date   CARDIAC CATHETERIZATION     10 yrs ago   COLONOSCOPY  06/27/2016   MAXIMUM ACCESS (MAS)POSTERIOR LUMBAR INTERBODY FUSION (PLIF) 1 LEVEL N/A 05/27/2013   Procedure: FOR MAXIMUM ACCESS (MAS) POSTERIOR LUMBAR INTERBODY FUSION (PLIF) 1 LEVEL ,LUMBAR FOUR-FIVE;  Surgeon: Eustace Tripp, MD;  Location: [place] [facility];  Service: Neurosurgery;  Laterality: N/A;  FOR MAXIMUM ACCESS (MAS) POSTERIOR LUMBAR INTERBODY FUSION (PLIF) 1 LEVEL ,LUMBAR FOUR-FIVE   NASAL POLYP EXCISION      TONSILLECTOMY      There were no vitals filed for this visit.   Subjective Assessment - 03/28/21 1544     Subjective Pt presents    Currently in Pain? Yes    Pain Score 2     Pain Location Leg    Pain Orientation Left;Posterior           [facility] Adult PT Treatment/Exercise:   Therapeutic Exercise:  NuStep lvl 7 UE/LE x 4 min while taking subjective Supine piriformis stretch 2x30 sec S/L clam 2x15 green tband - L 90/90 table top hold 3x30 sec Bridge 2x10 - 3 sec Supine physioball rollout 2x10  Bird dog 2x10 - 5 sec hold  Past Interventions Not Performed Today: STS 3x10 - 15lb KB Supine sciatic nerve glide x 20 ea Supine PPT x 10 - 5 sec hold 90/90 alt taps 2x10                      Trigger Point Dry Needling - 03/28/21 0001     Consent Given? Yes    Education Handout Provided Yes    Muscles Treated Back/Hip Piriformis    Piriformis Response Twitch response elicited;Palpable increased muscle length   Left  PT Education - 03/28/21 1638     Education Details TPDN response and possible adverse effects    Person(s) Educated Patient    Methods Explanation;Demonstration    Comprehension Verbalized understanding              PT Short Term Goals - 02/05/21 1712       PT SHORT TERM GOAL #1   Title Pt will report understanding and adherence to his HEP in order to promote independence in the management of his primary impairments.    Baseline HEP provided at eval    Time 4    Period Weeks    Status New    Target Date 03/05/21               PT Long Term Goals - 02/05/21 1743       PT LONG TERM GOAL #1   Title Pt will achieve a FOTO score of 65% in order to demonstrate improved functional ability as it relates to his concordant pain.    Baseline 55%    Time 8    Period Weeks    Status New    Target Date 04/02/21      PT LONG TERM GOAL #2   Title Pt will report ability to walk 30 minutes with 0-2/10 pain  in order to return to walking for exercise without limitation.    Baseline pt has severe pain and sxs after 6 minutes of walking    Time 8    Period Weeks    Status New    Target Date 04/02/21      PT LONG TERM GOAL #3   Title Pt will report 50% decrease in LE sxs in order to return to pickleball without limitation.    Baseline -    Time 8    Period Weeks    Status New    Target Date 04/02/21      PT LONG TERM GOAL #4   Title Pt will achieve BIL global strength of 5/5 in order to progress LE strengthening regimen without limitation.    Baseline See flowsheets    Time 8    Period Weeks    Status New    Target Date 04/02/21                   Plan - 03/28/21 1626     Clinical Impression Statement Pt was able to complete all prescribed exercises with no adverse effect. He responded well to TPDN, noting slight decrease in pain post session. Pt will be seen for next visit and will eventually return to play pickle ball to see how he responds. Pt overall is progressing well with therapy and should continue to be seen and progressed as tolerated per POC.    PT Treatment/Interventions ADLs/Self Care Home Management;Cryotherapy;Neuromuscular re-education;Balance training;Therapeutic exercise;Therapeutic activities;Functional mobility training;Stair training;Gait training;Moist Heat;Traction;Taping;Patient/family education;Passive range of motion;Dry needling;Joint Manipulations;Manual techniques    PT Next Visit Plan DN to lumbar multifidi and piriformis, progress core as able    PT Home Exercise Plan Access Code: [email or web address]             Patient will benefit from skilled therapeutic intervention in order to improve the following deficits and impairments:  Decreased range of motion, Difficulty walking, Pain, Hypomobility, Decreased balance, Decreased mobility, Decreased strength, Postural dysfunction  Visit Diagnosis: Muscle weakness (generalized)  Chronic bilateral low back pain  with bilateral sciatica  Difficulty in walking, not elsewhere classified  Problem List Patient Active Problem List   Diagnosis Date Noted   Sleep disturbance 08/07/2020   Piriformis syndrome of right side 08/07/2020   Chronic right-sided low back pain with right-sided sciatica 06/26/2020   Muscle cramp 06/26/2020   At risk for activity intolerance 06/05/2020   Sciatic pain, unspecified laterality 06/05/2020   Low HDL (under 40) 04/12/2020   Other hyperlipidemia 03/23/2020   Prediabetes 03/09/2020   Other fatigue 03/09/2020   OSA on CPAP 03/09/2020   Vitamin D deficiency 03/09/2020   Transient elevated blood pressure 03/29/2015   PAD (peripheral artery disease) ([place]) 06/14/2014   S/P lumbar spinal fusion 05/27/2013   Lumbosacral root lesions, not elsewhere classified 08/19/2012   Spinal stenosis of lumbar region 08/18/2012   Pain in soft tissues of limb 04/17/2012   RBBB 10/26/2009   Obstructive sleep apnea 10/23/2009   CAD, NATIVE VESSEL 10/23/2009   Hyperlipidemia 09/13/2008   THROMBOCYTOPENIA 09/13/2008   Mood disorder ([place]) 09/13/2008   GERD 09/13/2008    Ward Chatters, PT 03/28/2021, 4:39 PM  [facility] [address] [place], [place], [address] Phone: [phone]   Fax:  [phone]  Name: Donald Pope MRN: [id number] Date of Birth: Jun 29, 1945

## 2021-03-30 ENCOUNTER — Other Ambulatory Visit: Payer: Self-pay | Admitting: Family Medicine

## 2021-04-02 ENCOUNTER — Ambulatory Visit: Payer: PPO

## 2021-04-02 ENCOUNTER — Telehealth: Payer: Self-pay | Admitting: Family Medicine

## 2021-04-02 ENCOUNTER — Other Ambulatory Visit: Payer: Self-pay

## 2021-04-02 DIAGNOSIS — G8929 Other chronic pain: Secondary | ICD-10-CM

## 2021-04-02 DIAGNOSIS — M5442 Lumbago with sciatica, left side: Secondary | ICD-10-CM | POA: Diagnosis not present

## 2021-04-02 DIAGNOSIS — R262 Difficulty in walking, not elsewhere classified: Secondary | ICD-10-CM

## 2021-04-02 DIAGNOSIS — M6281 Muscle weakness (generalized): Secondary | ICD-10-CM

## 2021-04-02 MED ORDER — CITALOPRAM HYDROBROMIDE 10 MG PO TABS: 10.0000 mg | ORAL_TABLET | Freq: Every day | ORAL | 0 refills | Status: DC

## 2021-04-02 NOTE — Telephone Encounter (Signed)
Pt is going out of town tomorrow and does have an appt on 04-09-2021 and would like a refill on  citalopram (CELEXA) 10 MG tablet send to   [id number] - , [place] [address] [place] Phone:  [phone]  Fax:  [phone]

## 2021-04-02 NOTE — Telephone Encounter (Signed)
Rx sent in. Spoke with the patient, he is aware.

## 2021-04-02 NOTE — Therapy (Addendum)
[place], [place], [address] Phone: [phone]   Fax:  [phone]  Physical Therapy Treatment/Discharge  Patient Details  Name: Donald Pope MRN: [id number] Date of Birth: 05-Feb-1946 Referring Provider (PT): Jamse Arn, MD   Encounter Date: 04/02/2021   PT End of Session - 04/02/21 1614     Visit Number 8    Number of Visits 9    Date for PT Re-Evaluation 05/14/21    Authorization Type [employer]    Authorization Time Period FOTO v6,10    Progress Note Due on Visit 10    PT Start Time 1615    PT Stop Time 1658    PT Time Calculation (min) 43 min    Activity Tolerance Patient tolerated treatment well    Behavior During Therapy [facility] for tasks assessed/performed             Past Medical History:  Diagnosis Date   Allergy    Anxiety    Back pain    Benign prostatic hypertrophy    possible   Bilateral leg cramps    CAD, NATIVE VESSEL 10/23/2009   Chest pain    DEPRESSION 09/13/2008   Depression    False positive stress test    history of, suggestive of inferior ischemia   GERD 09/13/2008   HYPERLIPIDEMIA 09/13/2008   Meniere's disease    OBSTRUCTIVE SLEEP APNEA 10/23/2009   cpap   RBBB 10/26/2009   [place]   Sciatic nerve pain    Sleep apnea    Cpap   SOB (shortness of breath)    Spinal stenosis of lumbar region 08/18/2012   THROMBOCYTOPENIA 09/13/2008    Past Surgical History:  Procedure Laterality Date   CARDIAC CATHETERIZATION     10 yrs ago   COLONOSCOPY  06/27/2016   MAXIMUM ACCESS (MAS)POSTERIOR LUMBAR INTERBODY FUSION (PLIF) 1 LEVEL N/A 05/27/2013   Procedure: FOR MAXIMUM ACCESS (MAS) POSTERIOR LUMBAR INTERBODY FUSION (PLIF) 1 LEVEL ,LUMBAR FOUR-FIVE;  Surgeon: Eustace Strick, MD;  Location: [place] [facility];  Service: Neurosurgery;  Laterality: N/A;  FOR MAXIMUM ACCESS (MAS) POSTERIOR LUMBAR INTERBODY FUSION (PLIF) 1 LEVEL ,LUMBAR FOUR-FIVE   NASAL POLYP EXCISION      TONSILLECTOMY      There were no vitals filed for this visit.   Subjective Assessment - 04/02/21 1616     Subjective Pt presents to PT with reports of continued lower back pain and discomfort. He also reports paresthesias continuing down posterior LEs bilaterally, especially with prolonged standing or increased activity. Notes soreness of L gluteals after DN, denies relief in symptoms. Is ready to begin PT at this time.    Currently in Pain? Yes    Pain Score 3     Pain Location Leg    Pain Orientation Left;Posterior;Right           [facility] Adult PT Treatment/Exercise:   Therapeutic Exercise:  NuStep lvl 7 UE/LE x 4 min while taking subjective Supine piriformis stretch 2x30 sec S/L clam 2x15 green tband Supine physioball rollout 2x15  Bird dog into crunch 2x10 - 5 sec hold Supine sciatic nerve glide x 15 ea STS 3x10 - 15lb KB Seated physioball rollout x 15   Past Interventions Not Performed Today: STS 3x10 - 15lb KB Supine PPT x 10 - 5 sec hold 90/90 alt taps 2x10 Bridge 2x10 - 3 sec  PT Short Term Goals - 04/02/21 1702       PT SHORT TERM GOAL #1   Title Pt will report understanding and adherence to his HEP in order to promote independence in the management of his primary impairments.    Baseline HEP provided at eval    Time 4    Period Weeks    Status Achieved    Target Date 03/05/21               PT Long Term Goals - 04/02/21 1702       PT LONG TERM GOAL #1   Title Pt will achieve a FOTO score of 65% in order to demonstrate improved functional ability as it relates to his concordant pain.    Baseline 55%    Time 6    Period Weeks    Status On-going    Target Date 05/14/21      PT LONG TERM GOAL #2   Title Pt will report ability to walk 30 minutes with 0-2/10 pain in order to return to walking for exercise without limitation.    Baseline pt has severe pain and sxs after 6 minutes of walking - no  change    Time 6    Period Weeks    Status On-going    Target Date 05/14/21      PT LONG TERM GOAL #3   Title Pt will report 50% decrease in LE sxs in order to return to pickleball without limitation.    Baseline slight decrease in symptoms - 25%    Time 6    Period Weeks    Status On-going    Target Date 05/14/21      PT LONG TERM GOAL #4   Title Pt will achieve BIL global strength of 5/5 in order to progress LE strengthening regimen without limitation.    Baseline See flowsheets    Time 6    Period Weeks    Status On-going    Target Date 05/14/21                   Plan - 04/02/21 1705     Clinical Impression Statement Pt was able to complete prescribed exercises with no adverse effect or change in baseline. Over the course of PT, he has made some progress towards improving activity tolerance and strength, but continues to have significant LE paresthesias with increased activity that limit his participation with recreational activities. He will try and be more active with pickle ball and walking over the next 3 weeks, with PT session after that time to assess response. Will otherwise continue per POC.    PT Treatment/Interventions ADLs/Self Care Home Management;Cryotherapy;Neuromuscular re-education;Balance training;Therapeutic exercise;Therapeutic activities;Functional mobility training;Stair training;Gait training;Moist Heat;Traction;Taping;Patient/family education;Passive range of motion;Dry needling;Joint Manipulations;Manual techniques    PT Next Visit Plan DN to lumbar multifidi and piriformis, progress core as able    PT Home Exercise Plan Access Code: [email or web address]             Patient will benefit from skilled therapeutic intervention in order to improve the following deficits and impairments:  Decreased range of motion, Difficulty walking, Pain, Hypomobility, Decreased balance, Decreased mobility, Decreased strength, Postural dysfunction  Visit Diagnosis: Muscle  weakness (generalized)  Chronic bilateral low back pain with bilateral sciatica  Difficulty in walking, not elsewhere classified     Problem List Patient Active Problem List   Diagnosis Date Noted   Sleep disturbance 08/07/2020   Piriformis syndrome of right side  08/07/2020   Chronic right-sided low back pain with right-sided sciatica 06/26/2020   Muscle cramp 06/26/2020   At risk for activity intolerance 06/05/2020   Sciatic pain, unspecified laterality 06/05/2020   Low HDL (under 40) 04/12/2020   Other hyperlipidemia 03/23/2020   Prediabetes 03/09/2020   Other fatigue 03/09/2020   OSA on CPAP 03/09/2020   Vitamin D deficiency 03/09/2020   Transient elevated blood pressure 03/29/2015   PAD (peripheral artery disease) ([place]) 06/14/2014   S/P lumbar spinal fusion 05/27/2013   Lumbosacral root lesions, not elsewhere classified 08/19/2012   Spinal stenosis of lumbar region 08/18/2012   Pain in soft tissues of limb 04/17/2012   RBBB 10/26/2009   Obstructive sleep apnea 10/23/2009   CAD, NATIVE VESSEL 10/23/2009   Hyperlipidemia 09/13/2008   THROMBOCYTOPENIA 09/13/2008   Mood disorder ([place]) 09/13/2008   GERD 09/13/2008    Ward Chatters, PT 04/02/2021, 5:07 PM  [facility] [address] [place], [place], [address] Phone: [phone]   Fax:  [phone]  Name: Donald Pope MRN: [id number] Date of Birth: 12/06/1945   PHYSICAL THERAPY DISCHARGE SUMMARY  Visits from Start of Care: 8  Current functional level related to goals / functional outcomes: N/A   Remaining deficits: N/A   Education / Equipment: N/A   Patient agrees to discharge. Patient goals were  N/A . Patient is being discharged due to not returning since the last visit.

## 2021-04-09 ENCOUNTER — Other Ambulatory Visit: Payer: Self-pay

## 2021-04-09 ENCOUNTER — Ambulatory Visit (INDEPENDENT_AMBULATORY_CARE_PROVIDER_SITE_OTHER): Payer: PPO

## 2021-04-09 ENCOUNTER — Ambulatory Visit (INDEPENDENT_AMBULATORY_CARE_PROVIDER_SITE_OTHER): Payer: PPO | Admitting: Family Medicine

## 2021-04-09 VITALS — BP 130/70 | HR 90 | Temp 97.6°F | Wt 192.4 lb

## 2021-04-09 DIAGNOSIS — M2578 Osteophyte, vertebrae: Secondary | ICD-10-CM | POA: Diagnosis not present

## 2021-04-09 DIAGNOSIS — M545 Low back pain, unspecified: Secondary | ICD-10-CM

## 2021-04-09 DIAGNOSIS — M48061 Spinal stenosis, lumbar region without neurogenic claudication: Secondary | ICD-10-CM | POA: Diagnosis not present

## 2021-04-09 MED ORDER — CITALOPRAM HYDROBROMIDE 10 MG PO TABS: 10.0000 mg | ORAL_TABLET | Freq: Every day | ORAL | 3 refills | Status: DC

## 2021-04-09 NOTE — Progress Notes (Signed)
Established Patient Office Visit  Subjective:  Patient ID: Donald Pope, male    DOB: 1945-06-12  Age: 75 y.o. MRN: [id number]  CC:  Chief Complaint  Patient presents with   discuss medication    HPI Donald Pope presents for the following items  He is requesting refills of Celexa.  He takes low-dose Celexa 10 mg which he has taken for anxiety and mild agitation symptoms in the past.  He is taking this as a preventative and this seems to be working well with no side effects.  He does take low-dose Elavil but only 10 mg and is also on low-dose Celexa so risk of serotonin syndrome is very low  His main issue is bilateral lower lumbar back pain.  He had surgery with L4-5 fusion several years ago.  He enjoys playing pickle ball.  He recently had difficulty with walking and playing pickle ball and quality of life is poor because of his chronic daily pain.  He has pain which radiates down both lower extremities in a shocklike manner.  No numbness.  No definite weakness.  No urine or stool incontinence.  Pain is severe at times.  Consistently worse with back extension and walking and improved at rest.  He went to chiropractor for couple months without improvement.  He then went to rehab facility and did not see any improvement.  He has been now recently with physical therapy for couple months and also not seeing improvements.  Past Medical History:  Diagnosis Date   Allergy    Anxiety    Back pain    Benign prostatic hypertrophy    possible   Bilateral leg cramps    CAD, NATIVE VESSEL 10/23/2009   Chest pain    DEPRESSION 09/13/2008   Depression    False positive stress test    history of, suggestive of inferior ischemia   GERD 09/13/2008   HYPERLIPIDEMIA 09/13/2008   Meniere's disease    OBSTRUCTIVE SLEEP APNEA 10/23/2009   cpap   RBBB 10/26/2009   [place]   Sciatic nerve pain    Sleep apnea    Cpap   SOB (shortness of breath)    Spinal stenosis of lumbar region 08/18/2012    THROMBOCYTOPENIA 09/13/2008    Past Surgical History:  Procedure Laterality Date   CARDIAC CATHETERIZATION     10 yrs ago   COLONOSCOPY  06/27/2016   MAXIMUM ACCESS (MAS)POSTERIOR LUMBAR INTERBODY FUSION (PLIF) 1 LEVEL N/A 05/27/2013   Procedure: FOR MAXIMUM ACCESS (MAS) POSTERIOR LUMBAR INTERBODY FUSION (PLIF) 1 LEVEL ,LUMBAR FOUR-FIVE;  Surgeon: Eustace Bramhall, MD;  Location: [facility];  Service: Neurosurgery;  Laterality: N/A;  FOR MAXIMUM ACCESS (MAS) POSTERIOR LUMBAR INTERBODY FUSION (PLIF) 1 LEVEL ,LUMBAR FOUR-FIVE   NASAL POLYP EXCISION     TONSILLECTOMY      Family History  Problem Relation Age of Onset   Heart disease Mother 8       CHF   Heart disease Father    COPD Father    Colon polyps Father    Alcoholism Father    Hypertension Sister    Hyperlipidemia Sister    Stomach cancer Neg Hx    Colon cancer Neg Hx    Esophageal cancer Neg Hx    Ulcerative colitis Neg Hx     Social History   Socioeconomic History   Marital status: Married    Spouse name: Treydon Henricks   Number of children: 1   Years of education:  16    Highest education level: Bachelor's degree (e.g., BA, AB, BS)  Occupational History   Occupation: retired  Tobacco Use   Smoking status: Never   Smokeless tobacco: Never  Vaping Use   Vaping Use: Never used  Substance and Sexual Activity   Alcohol use: Yes    Alcohol/week: 1.0 standard drink    Types: 1 Glasses of wine per week    Comment: 4-5 times a week   Drug use: No   Sexual activity: Not Currently  Other Topics Concern   Not on file  Social History Narrative   Lives with wife in [place] [facility]. Son lives in [place], [place], 1 grandson.    Animal-lover, especially dogs.   Exercises routinely at [facility], enjoys yoga   Social Determinants of Health   Financial Resource Strain: Low Risk    Difficulty of Paying Living Expenses: Not hard at all  Food Insecurity: No Food Insecurity   Worried About Charity fundraiser in the Last Year: Never  true   Arboriculturist in the Last Year: Never true  Transportation Needs: No Transportation Needs   Lack of Transportation (Medical): No   Lack of Transportation (Non-Medical): No  Physical Activity: Inactive   Days of Exercise per Week: 0 days   Minutes of Exercise per Session: 40 min  Stress: No Stress Concern Present   Feeling of Stress : Only a little  Social Connections: Engineer, building services of Communication with Friends and Family: Three times a week   Frequency of Social Gatherings with Friends and Family: Twice a week   Attends Religious Services: More than 4 times per year   Active Member of [employer] or Organizations: Yes   Attends Music therapist: More than 4 times per year   Marital Status: Married  Human resources officer Violence: Not At Risk   Fear of Current or Ex-Partner: No   Emotionally Abused: No   Physically Abused: No   Sexually Abused: No    Outpatient Medications Prior to Visit  Medication Sig Dispense Refill   amitriptyline (ELAVIL) 10 MG tablet TAKE 1 TABLET BY MOUTH EVERYDAY AT BEDTIME 90 tablet 3   aspirin 81 MG chewable tablet CHEW ONE TABLET BY MOUTH DAILY     cetirizine (ZYRTEC) 10 MG tablet Takes one tablet every other day.     cholecalciferol (VITAMIN D) 1000 UNITS tablet Take 1,000 Units by mouth daily.      Coenzyme Q10 (CO Q 10 PO) Take by mouth.     fluticasone (FLONASE) 50 MCG/ACT nasal spray Place 2 sprays into both nostrils every other day.     gabapentin (NEURONTIN) 600 MG tablet Take 2 tablets (1,200 mg total) by mouth 2 (two) times daily. 120 tablet 2   Magnesium 500 MG TABS Take by mouth daily.     Melatonin 3 MG TABS Take 5 mg by mouth.      methocarbamol (ROBAXIN) 500 MG tablet Take 2 tablets (1,000 mg total) by mouth 3 (three) times daily. 90 tablet 1   Multiple Vitamin (MULTIVITAMIN) tablet Take 1 tablet by mouth daily.     omeprazole (PRILOSEC) 20 MG capsule Take 20 mg by mouth every other day.     rosuvastatin  (CRESTOR) 5 MG tablet Take 5 mg by mouth daily.     Vitamin D, Ergocalciferol, (DRISDOL) 1.25 MG (50000 UNIT) CAPS capsule Take 1 capsule (50,000 Units total) by mouth every 7 (seven) days. 8 capsule 0  citalopram (CELEXA) 10 MG tablet Take 1 tablet (10 mg total) by mouth daily. 30 tablet 0   No facility-administered medications prior to visit.    Allergies  Allergen Reactions   Adhesive [Tape] Rash   Codeine Sulfate Other (See Comments)    urination problems   Latex Rash    ROS Review of Systems  Constitutional:  Negative for activity change, appetite change and fever.  Respiratory:  Negative for cough and shortness of breath.   Cardiovascular:  Negative for chest pain and leg swelling.  Gastrointestinal:  Negative for abdominal pain and vomiting.  Genitourinary:  Negative for dysuria, flank pain and hematuria.  Musculoskeletal:  Positive for back pain. Negative for joint swelling.  Neurological:  Negative for weakness and numbness.     Objective:    Physical Exam Constitutional:      Appearance: He is well-developed.  HENT:     Right Ear: External ear normal.     Left Ear: External ear normal.  Eyes:     Pupils: Pupils are equal, round, and reactive to light.  Neck:     Thyroid: No thyromegaly.  Cardiovascular:     Rate and Rhythm: Normal rate and regular rhythm.  Pulmonary:     Effort: Pulmonary effort is normal. No respiratory distress.     Breath sounds: Normal breath sounds. No wheezing or rales.  Musculoskeletal:     Cervical back: Neck supple.     Comments: No tenderness lumbar region.  Straight leg raise are negative bilaterally.  Neurological:     Mental Status: He is alert and oriented to person, place, and time.     Comments: He has 2+ reflexes knee and ankle bilaterally.  Full strength with plantarflexion, dorsiflexion, and knee extension bilaterally    BP 130/70 (BP Location: Left Arm, Patient Position: Sitting, Cuff Size: Normal)   Pulse 90   Temp  97.6 F (36.4 C) (Oral)   Wt 192 lb 6.4 oz (87.3 kg)   SpO2 99%   BMI 30.13 kg/m  Wt Readings from Last 3 Encounters:  04/09/21 192 lb 6.4 oz (87.3 kg)  12/28/20 183 lb 6.4 oz (83.2 kg)  10/05/20 182 lb (82.6 kg)     There are no preventive care reminders to display for this patient.  There are no preventive care reminders to display for this patient.  Lab Results  Component Value Date   TSH 2.510 03/09/2020   Lab Results  Component Value Date   WBC 6.4 03/09/2020   HGB 14.0 03/09/2020   HCT 40.9 03/09/2020   MCV 91 03/09/2020   PLT 126 (L) 03/09/2020   Lab Results  Component Value Date   NA 139 03/09/2020   K 4.5 03/09/2020   CO2 23 03/09/2020   GLUCOSE 113 (H) 03/09/2020   BUN 17 03/09/2020   CREATININE 1.06 03/09/2020   BILITOT 0.3 03/09/2020   ALKPHOS 84 03/09/2020   AST 27 03/09/2020   ALT 23 03/09/2020   PROT 7.1 03/09/2020   ALBUMIN 4.5 03/09/2020   CALCIUM 9.2 03/09/2020   GFR 77.98 06/16/2017   Lab Results  Component Value Date   CHOL 131 06/21/2020   Lab Results  Component Value Date   HDL 39 (L) 06/21/2020   Lab Results  Component Value Date   LDLCALC 72 06/21/2020   Lab Results  Component Value Date   TRIG 109 06/21/2020   Lab Results  Component Value Date   CHOLHDL 3.4 06/21/2020   Lab Results  Component Value Date   HGBA1C 5.8 (H) 06/21/2020      Assessment & Plan:   #1 progressive bilateral back and lower extremity pain.  Clinically, suspect progression of his lumbar stenosis.  He has been under multiple conservative therapy with chiropractic care, physical therapy without improvement. His pain has progressed to the point that this is starting to interfere increasingly with day-to-day activities and is virtually eliminated his ability to exercise and engage in activities he enjoys  -Obtain follow-up lumbosacral films -We will probably need repeat MRI to help further clarify and may need neurosurgical consultation.  #2  history of chronic low-grade anxiety and agitation.  Stable on Celexa.  Refill Celexa 10 mg daily for 1 year   Meds ordered this encounter  Medications   citalopram (CELEXA) 10 MG tablet    Sig: Take 1 tablet (10 mg total) by mouth daily.    Dispense:  90 tablet    Refill:  3    Follow-up: No follow-ups on file.    Carolann Littler, MD

## 2021-04-11 DIAGNOSIS — L821 Other seborrheic keratosis: Secondary | ICD-10-CM | POA: Diagnosis not present

## 2021-04-11 DIAGNOSIS — L218 Other seborrheic dermatitis: Secondary | ICD-10-CM | POA: Diagnosis not present

## 2021-04-11 DIAGNOSIS — L820 Inflamed seborrheic keratosis: Secondary | ICD-10-CM | POA: Diagnosis not present

## 2021-04-11 DIAGNOSIS — Z85828 Personal history of other malignant neoplasm of skin: Secondary | ICD-10-CM | POA: Diagnosis not present

## 2021-04-12 ENCOUNTER — Encounter: Payer: Self-pay | Admitting: Family Medicine

## 2021-04-13 ENCOUNTER — Other Ambulatory Visit: Payer: Self-pay

## 2021-04-13 DIAGNOSIS — M48061 Spinal stenosis, lumbar region without neurogenic claudication: Secondary | ICD-10-CM

## 2021-04-16 ENCOUNTER — Ambulatory Visit (INDEPENDENT_AMBULATORY_CARE_PROVIDER_SITE_OTHER): Payer: PPO | Admitting: Family Medicine

## 2021-04-16 ENCOUNTER — Encounter: Payer: Self-pay | Admitting: Family Medicine

## 2021-04-16 VITALS — BP 148/70 | HR 74 | Temp 97.7°F | Ht 67.0 in | Wt 193.3 lb

## 2021-04-16 DIAGNOSIS — M48061 Spinal stenosis, lumbar region without neurogenic claudication: Secondary | ICD-10-CM

## 2021-04-16 NOTE — Progress Notes (Signed)
Established Patient Office Visit  Subjective:  Patient ID: Donald Pope, male    DOB: 30-Jul-1945  Age: 75 y.o. MRN: [id number]  CC:  Chief Complaint  Patient presents with   Results    HPI Donald Pope presents for follow-up basically to discuss his recent lumbar spine films.  He relates about 4 or 68-month history of daily midline lower back pain with some radiation into both buttocks and lower extremities.  Plain film of the back revealed evidence for L4-5 posterior fusion hardware in place and unchanged.  No fracture.  Disc space appear to be maintained.  No acute findings.  We recommend he follow-up with his neurosurgeon and referral has been made.  Prior to 4 to have months ago he was playing pickle ball most days of walking about 2 and half miles per day but is had progressive difficulty since then.  No specific injury.  He remains on gabapentin and Elavil which he thinks helped somewhat.  No urine or stool incontinence.  Past Medical History:  Diagnosis Date   Allergy    Anxiety    Back pain    Benign prostatic hypertrophy    possible   Bilateral leg cramps    CAD, NATIVE VESSEL 10/23/2009   Chest pain    DEPRESSION 09/13/2008   Depression    False positive stress test    history of, suggestive of inferior ischemia   GERD 09/13/2008   HYPERLIPIDEMIA 09/13/2008   Meniere's disease    OBSTRUCTIVE SLEEP APNEA 10/23/2009   cpap   RBBB 10/26/2009   [place]   Sciatic nerve pain    Sleep apnea    Cpap   SOB (shortness of breath)    Spinal stenosis of lumbar region 08/18/2012   THROMBOCYTOPENIA 09/13/2008    Past Surgical History:  Procedure Laterality Date   CARDIAC CATHETERIZATION     10 yrs ago   COLONOSCOPY  06/27/2016   MAXIMUM ACCESS (MAS)POSTERIOR LUMBAR INTERBODY FUSION (PLIF) 1 LEVEL N/A 05/27/2013   Procedure: FOR MAXIMUM ACCESS (MAS) POSTERIOR LUMBAR INTERBODY FUSION (PLIF) 1 LEVEL ,LUMBAR FOUR-FIVE;  Surgeon: Eustace Schwier, MD;  Location: [place] [facility];  Service:  Neurosurgery;  Laterality: N/A;  FOR MAXIMUM ACCESS (MAS) POSTERIOR LUMBAR INTERBODY FUSION (PLIF) 1 LEVEL ,LUMBAR FOUR-FIVE   NASAL POLYP EXCISION     TONSILLECTOMY      Family History  Problem Relation Age of Onset   Heart disease Mother 30       CHF   Heart disease Father    COPD Father    Colon polyps Father    Alcoholism Father    Hypertension Sister    Hyperlipidemia Sister    Stomach cancer Neg Hx    Colon cancer Neg Hx    Esophageal cancer Neg Hx    Ulcerative colitis Neg Hx     Social History   Socioeconomic History   Marital status: Married    Spouse name: Donald Pope   Number of children: 1   Years of education: 16    Highest education level: Bachelor's degree (e.g., BA, AB, BS)  Occupational History   Occupation: retired  Tobacco Use   Smoking status: Never   Smokeless tobacco: Never  Vaping Use   Vaping Use: Never used  Substance and Sexual Activity   Alcohol use: Yes    Alcohol/week: 1.0 standard drink    Types: 1 Glasses of wine per week    Comment: 4-5 times a week  Drug use: No   Sexual activity: Not Currently  Other Topics Concern   Not on file  Social History Narrative   Lives with wife in [place] home. Son lives in [place], [place], 1 grandson.    Animal-lover, especially dogs.   Exercises routinely at [facility], enjoys yoga   Social Determinants of Health   Financial Resource Strain: Low Risk    Difficulty of Paying Living Expenses: Not hard at all  Food Insecurity: No Food Insecurity   Worried About Charity fundraiser in the Last Year: Never true   Arboriculturist in the Last Year: Never true  Transportation Needs: No Transportation Needs   Lack of Transportation (Medical): No   Lack of Transportation (Non-Medical): No  Physical Activity: Inactive   Days of Exercise per Week: 0 days   Minutes of Exercise per Session: 40 min  Stress: No Stress Concern Present   Feeling of Stress : Only a little  Social Connections: Psychologist, educational of Communication with Friends and Family: Three times a week   Frequency of Social Gatherings with Friends and Family: Twice a week   Attends Religious Services: More than 4 times per year   Active Member of [employer] or Organizations: Yes   Attends Music therapist: More than 4 times per year   Marital Status: Married  Human resources officer Violence: Not At Risk   Fear of Current or Ex-Partner: No   Emotionally Abused: No   Physically Abused: No   Sexually Abused: No    Outpatient Medications Prior to Visit  Medication Sig Dispense Refill   amitriptyline (ELAVIL) 10 MG tablet TAKE 1 TABLET BY MOUTH EVERYDAY AT BEDTIME 90 tablet 3   aspirin 81 MG chewable tablet CHEW ONE TABLET BY MOUTH DAILY     cetirizine (ZYRTEC) 10 MG tablet Takes one tablet every other day.     cholecalciferol (VITAMIN D) 1000 UNITS tablet Take 1,000 Units by mouth daily.      citalopram (CELEXA) 10 MG tablet Take 1 tablet (10 mg total) by mouth daily. 90 tablet 3   Coenzyme Q10 (CO Q 10 PO) Take by mouth.     fluticasone (FLONASE) 50 MCG/ACT nasal spray Place 2 sprays into both nostrils every other day.     gabapentin (NEURONTIN) 600 MG tablet Take 2 tablets (1,200 mg total) by mouth 2 (two) times daily. 120 tablet 2   Magnesium 500 MG TABS Take by mouth daily.     Melatonin 3 MG TABS Take 5 mg by mouth.      methocarbamol (ROBAXIN) 500 MG tablet Take 2 tablets (1,000 mg total) by mouth 3 (three) times daily. 90 tablet 1   Multiple Vitamin (MULTIVITAMIN) tablet Take 1 tablet by mouth daily.     omeprazole (PRILOSEC) 20 MG capsule Take 20 mg by mouth every other day.     rosuvastatin (CRESTOR) 5 MG tablet Take 5 mg by mouth daily.     Vitamin D, Ergocalciferol, (DRISDOL) 1.25 MG (50000 UNIT) CAPS capsule Take 1 capsule (50,000 Units total) by mouth every 7 (seven) days. 8 capsule 0   No facility-administered medications prior to visit.    Allergies  Allergen Reactions   Adhesive [Tape] Rash    Codeine Sulfate Other (See Comments)    urination problems   Latex Rash    ROS Review of Systems  Constitutional:  Negative for activity change, appetite change and fever.  Respiratory:  Negative for cough and shortness  of breath.   Cardiovascular:  Negative for chest pain and leg swelling.  Gastrointestinal:  Negative for abdominal pain and vomiting.  Genitourinary:  Negative for dysuria, flank pain and hematuria.  Musculoskeletal:  Positive for back pain. Negative for joint swelling.  Neurological:  Negative for weakness and numbness.     Objective:    Physical Exam Vitals reviewed.  Constitutional:      Appearance: Normal appearance.  Cardiovascular:     Rate and Rhythm: Normal rate and regular rhythm.  Neurological:     Mental Status: He is alert.    BP (!) 148/70 (BP Location: Left Arm, Patient Position: Sitting, Cuff Size: Normal)   Pulse 74   Temp 97.7 F (36.5 C) (Oral)   Ht 5\' 7"  (1.702 m)   Wt 193 lb 4.8 oz (87.7 kg)   SpO2 97%   BMI 30.28 kg/m  Wt Readings from Last 3 Encounters:  04/16/21 193 lb 4.8 oz (87.7 kg)  04/09/21 192 lb 6.4 oz (87.3 kg)  12/28/20 183 lb 6.4 oz (83.2 kg)     There are no preventive care reminders to display for this patient.  There are no preventive care reminders to display for this patient.  Lab Results  Component Value Date   TSH 2.510 03/09/2020   Lab Results  Component Value Date   WBC 6.4 03/09/2020   HGB 14.0 03/09/2020   HCT 40.9 03/09/2020   MCV 91 03/09/2020   PLT 126 (L) 03/09/2020   Lab Results  Component Value Date   NA 139 03/09/2020   K 4.5 03/09/2020   CO2 23 03/09/2020   GLUCOSE 113 (H) 03/09/2020   BUN 17 03/09/2020   CREATININE 1.06 03/09/2020   BILITOT 0.3 03/09/2020   ALKPHOS 84 03/09/2020   AST 27 03/09/2020   ALT 23 03/09/2020   PROT 7.1 03/09/2020   ALBUMIN 4.5 03/09/2020   CALCIUM 9.2 03/09/2020   GFR 77.98 06/16/2017   Lab Results  Component Value Date   CHOL 131 06/21/2020    Lab Results  Component Value Date   HDL 39 (L) 06/21/2020   Lab Results  Component Value Date   LDLCALC 72 06/21/2020   Lab Results  Component Value Date   TRIG 109 06/21/2020   Lab Results  Component Value Date   CHOLHDL 3.4 06/21/2020   Lab Results  Component Value Date   HGBA1C 5.8 (H) 06/21/2020      Assessment & Plan:   Problem List Items Addressed This Visit       Unprioritized   Spinal stenosis of lumbar region - Primary (Chronic)  Bilateral lumbar pain exacerbated by walking and back extension.  Recent plain films showed no acute findings.  Pending referral to his neurosurgeon.  He is already taking Elavil and gabapentin Follow-up immediately for any progressive weakness or any other new symptoms  No orders of the defined types were placed in this encounter.   Follow-up: No follow-ups on file.    Carolann Littler, MD

## 2021-04-16 NOTE — Patient Instructions (Signed)
Let me know if you have not heard from Dr Ronnald Ramp office in the next week.

## 2021-04-20 ENCOUNTER — Encounter (HOSPITAL_BASED_OUTPATIENT_CLINIC_OR_DEPARTMENT_OTHER): Payer: Self-pay | Admitting: *Deleted

## 2021-04-20 ENCOUNTER — Inpatient Hospital Stay (HOSPITAL_BASED_OUTPATIENT_CLINIC_OR_DEPARTMENT_OTHER)
Admission: EM | Admit: 2021-04-20 | Discharge: 2021-04-23 | DRG: 378 | Disposition: A | Discharge status: Home / Self Care | Payer: PPO | Attending: Family Medicine | Admitting: Family Medicine

## 2021-04-20 ENCOUNTER — Other Ambulatory Visit: Payer: Self-pay

## 2021-04-20 DIAGNOSIS — K5731 Diverticulosis of large intestine without perforation or abscess with bleeding: Principal | ICD-10-CM | POA: Diagnosis present

## 2021-04-20 DIAGNOSIS — G479 Sleep disorder, unspecified: Secondary | ICD-10-CM | POA: Diagnosis present

## 2021-04-20 DIAGNOSIS — Z7982 Long term (current) use of aspirin: Secondary | ICD-10-CM | POA: Diagnosis not present

## 2021-04-20 DIAGNOSIS — Z91040 Latex allergy status: Secondary | ICD-10-CM

## 2021-04-20 DIAGNOSIS — Z83438 Family history of other disorder of lipoprotein metabolism and other lipidemia: Secondary | ICD-10-CM

## 2021-04-20 DIAGNOSIS — Z86010 Personal history of colonic polyps: Secondary | ICD-10-CM | POA: Diagnosis not present

## 2021-04-20 DIAGNOSIS — K769 Liver disease, unspecified: Secondary | ICD-10-CM | POA: Diagnosis present

## 2021-04-20 DIAGNOSIS — D62 Acute posthemorrhagic anemia: Secondary | ICD-10-CM | POA: Diagnosis not present

## 2021-04-20 DIAGNOSIS — K219 Gastro-esophageal reflux disease without esophagitis: Secondary | ICD-10-CM | POA: Diagnosis present

## 2021-04-20 DIAGNOSIS — F32A Depression, unspecified: Secondary | ICD-10-CM | POA: Diagnosis present

## 2021-04-20 DIAGNOSIS — Z91048 Other nonmedicinal substance allergy status: Secondary | ICD-10-CM | POA: Diagnosis not present

## 2021-04-20 DIAGNOSIS — G4733 Obstructive sleep apnea (adult) (pediatric): Secondary | ICD-10-CM | POA: Diagnosis not present

## 2021-04-20 DIAGNOSIS — Z981 Arthrodesis status: Secondary | ICD-10-CM

## 2021-04-20 DIAGNOSIS — G8929 Other chronic pain: Secondary | ICD-10-CM | POA: Diagnosis not present

## 2021-04-20 DIAGNOSIS — Z79899 Other long term (current) drug therapy: Secondary | ICD-10-CM | POA: Diagnosis not present

## 2021-04-20 DIAGNOSIS — N400 Enlarged prostate without lower urinary tract symptoms: Secondary | ICD-10-CM | POA: Diagnosis not present

## 2021-04-20 DIAGNOSIS — Z885 Allergy status to narcotic agent status: Secondary | ICD-10-CM | POA: Diagnosis not present

## 2021-04-20 DIAGNOSIS — E785 Hyperlipidemia, unspecified: Secondary | ICD-10-CM | POA: Diagnosis present

## 2021-04-20 DIAGNOSIS — N281 Cyst of kidney, acquired: Secondary | ICD-10-CM | POA: Diagnosis not present

## 2021-04-20 DIAGNOSIS — K921 Melena: Secondary | ICD-10-CM | POA: Diagnosis not present

## 2021-04-20 DIAGNOSIS — Z20822 Contact with and (suspected) exposure to covid-19: Secondary | ICD-10-CM | POA: Diagnosis not present

## 2021-04-20 DIAGNOSIS — N179 Acute kidney failure, unspecified: Secondary | ICD-10-CM | POA: Diagnosis not present

## 2021-04-20 DIAGNOSIS — D696 Thrombocytopenia, unspecified: Secondary | ICD-10-CM | POA: Diagnosis not present

## 2021-04-20 DIAGNOSIS — I739 Peripheral vascular disease, unspecified: Secondary | ICD-10-CM | POA: Diagnosis not present

## 2021-04-20 DIAGNOSIS — K648 Other hemorrhoids: Secondary | ICD-10-CM | POA: Diagnosis not present

## 2021-04-20 DIAGNOSIS — K922 Gastrointestinal hemorrhage, unspecified: Secondary | ICD-10-CM

## 2021-04-20 LAB — COMPREHENSIVE METABOLIC PANEL
ALT: 14 U/L (ref 0–44)
AST: 20 U/L (ref 15–41)
Albumin: 4.5 g/dL (ref 3.5–5.0)
Alkaline Phosphatase: 42 U/L (ref 38–126)
Anion gap: 10 (ref 5–15)
BUN: 26 mg/dL — ABNORMAL HIGH (ref 8–23)
CO2: 23 mmol/L (ref 22–32)
Calcium: 8.9 mg/dL (ref 8.9–10.3)
Chloride: 104 mmol/L (ref 98–111)
Creatinine, Ser: 1.17 mg/dL (ref 0.61–1.24)
GFR, Estimated: >60 mL/min (ref >60)
Glucose, Bld: 155 mg/dL — ABNORMAL HIGH (ref 70–99)
Potassium: 3.6 mmol/L (ref 3.5–5.1)
Sodium: 137 mmol/L (ref 135–145)
Total Bilirubin: 0.5 mg/dL (ref 0.3–1.2)
Total Protein: 7 g/dL (ref 6.5–8.1)

## 2021-04-20 LAB — CBC
HCT: 32.9 % — ABNORMAL LOW (ref 39.0–52.0)
Hemoglobin: 11 g/dL — ABNORMAL LOW (ref 13.0–17.0)
MCH: 30.6 pg (ref 26.0–34.0)
MCHC: 33.4 g/dL (ref 30.0–36.0)
MCV: 91.6 fL (ref 80.0–100.0)
Platelets: 126 10*3/uL — ABNORMAL LOW (ref 150–400)
RBC: 3.59 MIL/uL — ABNORMAL LOW (ref 4.22–5.81)
RDW: 13.1 % (ref 11.5–15.5)
WBC: 6.9 10*3/uL (ref 4.0–10.5)
nRBC: 0 % (ref 0.0–0.2)

## 2021-04-20 LAB — RESP PANEL BY RT-PCR (FLU A&B, COVID) ARPGX2
Influenza A by PCR: NEGATIVE
Influenza B by PCR: NEGATIVE
SARS Coronavirus 2 by RT PCR: NEGATIVE

## 2021-04-20 LAB — PROTIME-INR
INR: 1.1 (ref 0.8–1.2)
Prothrombin Time: 14.6 seconds (ref 11.4–15.2)

## 2021-04-20 MED ORDER — DEXTROSE-NACL 5-0.9 % IV SOLN: INTRAVENOUS | Status: DC

## 2021-04-20 MED ORDER — PANTOPRAZOLE SODIUM 40 MG PO TBEC
40.0000 mg | DELAYED_RELEASE_TABLET | Freq: Two times a day (BID) | ORAL | Status: DC
  Administered 2021-04-20 – 2021-04-23 (×6): 40 mg via ORAL
  Filled 2021-04-20 (×6): qty 1

## 2021-04-20 MED ORDER — FLUTICASONE PROPIONATE 50 MCG/ACT NA SUSP
2.0000 | NASAL | Status: DC
  Administered 2021-04-21 – 2021-04-23 (×2): 2 via NASAL
  Filled 2021-04-20: qty 16

## 2021-04-20 MED ORDER — AMITRIPTYLINE HCL 10 MG PO TABS
10.0000 mg | ORAL_TABLET | Freq: Every day | ORAL | Status: DC
  Administered 2021-04-20 – 2021-04-22 (×3): 10 mg via ORAL
  Filled 2021-04-20 (×4): qty 1

## 2021-04-20 MED ORDER — GABAPENTIN 600 MG PO TABS
1200.0000 mg | ORAL_TABLET | Freq: Two times a day (BID) | ORAL | Status: DC
  Administered 2021-04-20 – 2021-04-23 (×6): 1200 mg via ORAL
  Filled 2021-04-20 (×6): qty 2

## 2021-04-20 MED ORDER — METHOCARBAMOL 500 MG PO TABS
1000.0000 mg | ORAL_TABLET | Freq: Three times a day (TID) | ORAL | Status: DC
  Administered 2021-04-20 – 2021-04-23 (×8): 1000 mg via ORAL
  Filled 2021-04-20 (×9): qty 2

## 2021-04-20 MED ORDER — MELATONIN 5 MG PO TABS
5.0000 mg | ORAL_TABLET | Freq: Every day | ORAL | Status: DC
  Administered 2021-04-20 – 2021-04-22 (×3): 5 mg via ORAL
  Filled 2021-04-20 (×3): qty 1

## 2021-04-20 MED ORDER — PANTOPRAZOLE SODIUM 40 MG IV SOLR
40.0000 mg | Freq: Once | INTRAVENOUS | Status: AC
  Administered 2021-04-20: 40 mg via INTRAVENOUS
  Filled 2021-04-20: qty 40

## 2021-04-20 MED ORDER — CITALOPRAM HYDROBROMIDE 20 MG PO TABS
10.0000 mg | ORAL_TABLET | Freq: Every day | ORAL | Status: DC
  Administered 2021-04-21 – 2021-04-23 (×3): 10 mg via ORAL
  Filled 2021-04-20 (×3): qty 1

## 2021-04-20 NOTE — ED Notes (Signed)
During Pts restroom break, Pt had defecated bloody stools.

## 2021-04-20 NOTE — ED Notes (Signed)
Paged PA, Ellouise Newer with [employer]

## 2021-04-20 NOTE — ED Provider Notes (Signed)
[name] EMERGENCY DEPT Provider Note   CSN: [id number] Arrival date & time: 04/20/21  1014     History Chief Complaint  Patient presents with   Rectal Bleeding    Donald Pope is a 75 y.o. male.  75 yo M with a chief complaints of bloody stool.  He describes this is dark and has been going on now for a couple days.  Not feeling exceptionally lightheaded or dizzy.  Denies any abdominal discomfort.  Denies fevers or chills.  Denies suspicious food intake.  He takes baby aspirin a day.  Otherwise denies blood thinning medications.  He did have an episode like this previously which his family doctor had seen him for and it was presumed to be internal hemorrhoids.  The history is provided by the patient.  Rectal Bleeding Associated symptoms: no abdominal pain, no fever and no vomiting   Illness Severity:  Moderate Onset quality:  Gradual Duration:  2 days Timing:  Constant Progression:  Worsening Chronicity:  New Associated symptoms: no abdominal pain, no chest pain, no congestion, no diarrhea, no fever, no headaches, no myalgias, no rash, no shortness of breath and no vomiting       Past Medical History:  Diagnosis Date   Allergy    Anxiety    Back pain    Benign prostatic hypertrophy    possible   Bilateral leg cramps    CAD, NATIVE VESSEL 10/23/2009   Chest pain    DEPRESSION 09/13/2008   Depression    False positive stress test    history of, suggestive of inferior ischemia   GERD 09/13/2008   HYPERLIPIDEMIA 09/13/2008   Meniere's disease    OBSTRUCTIVE SLEEP APNEA 10/23/2009   cpap   RBBB 10/26/2009   [place]   Sciatic nerve pain    Sleep apnea    Cpap   SOB (shortness of breath)    Spinal stenosis of lumbar region 08/18/2012   THROMBOCYTOPENIA 09/13/2008    Patient Active Problem List   Diagnosis Date Noted   GI bleed 04/20/2021   Sleep disturbance 08/07/2020   Piriformis syndrome of right side 08/07/2020   Chronic right-sided low back pain  with right-sided sciatica 06/26/2020   Muscle cramp 06/26/2020   At risk for activity intolerance 06/05/2020   Sciatic pain, unspecified laterality 06/05/2020   Low HDL (under 40) 04/12/2020   Other hyperlipidemia 03/23/2020   Prediabetes 03/09/2020   Other fatigue 03/09/2020   OSA on CPAP 03/09/2020   Vitamin D deficiency 03/09/2020   Transient elevated blood pressure 03/29/2015   PAD (peripheral artery disease) ([place]) 06/14/2014   S/P lumbar spinal fusion 05/27/2013   Lumbosacral root lesions, not elsewhere classified 08/19/2012   Spinal stenosis of lumbar region 08/18/2012   Pain in soft tissues of limb 04/17/2012   RBBB 10/26/2009   Obstructive sleep apnea 10/23/2009   CAD, NATIVE VESSEL 10/23/2009   Hyperlipidemia 09/13/2008   THROMBOCYTOPENIA 09/13/2008   Mood disorder ([place]) 09/13/2008   GERD 09/13/2008    Past Surgical History:  Procedure Laterality Date   CARDIAC CATHETERIZATION     10 yrs ago   COLONOSCOPY  06/27/2016   MAXIMUM ACCESS (MAS)POSTERIOR LUMBAR INTERBODY FUSION (PLIF) 1 LEVEL N/A 05/27/2013   Procedure: FOR MAXIMUM ACCESS (MAS) POSTERIOR LUMBAR INTERBODY FUSION (PLIF) 1 LEVEL ,LUMBAR FOUR-FIVE;  Surgeon: Eustace Hou, MD;  Location: [facility];  Service: Neurosurgery;  Laterality: N/A;  FOR MAXIMUM ACCESS (MAS) POSTERIOR LUMBAR INTERBODY FUSION (PLIF) 1 LEVEL ,LUMBAR FOUR-FIVE  NASAL POLYP EXCISION     TONSILLECTOMY         Family History  Problem Relation Age of Onset   Heart disease Mother 5       CHF   Heart disease Father    COPD Father    Colon polyps Father    Alcoholism Father    Hypertension Sister    Hyperlipidemia Sister    Stomach cancer Neg Hx    Colon cancer Neg Hx    Esophageal cancer Neg Hx    Ulcerative colitis Neg Hx     Social History   Tobacco Use   Smoking status: Never   Smokeless tobacco: Never  Vaping Use   Vaping Use: Never used  Substance Use Topics   Alcohol use: Yes    Alcohol/week: 1.0 standard drink     Types: 1 Glasses of wine per week    Comment: 4-5 times a week   Drug use: No    Home Medications Prior to Admission medications   Medication Sig Start Date End Date Taking? Authorizing Provider  amitriptyline (ELAVIL) 10 MG tablet TAKE 1 TABLET BY MOUTH EVERYDAY AT BEDTIME 10/05/20   Jamse Arn, MD  aspirin 81 MG chewable tablet CHEW ONE TABLET BY MOUTH DAILY 08/04/14   [provider]  cetirizine (ZYRTEC) 10 MG tablet Takes one tablet every other day.    [provider]  cholecalciferol (VITAMIN D) 1000 UNITS tablet Take 1,000 Units by mouth daily.     [provider]  citalopram (CELEXA) 10 MG tablet Take 1 tablet (10 mg total) by mouth daily. 04/09/21   Burchette, Alinda Sierras, MD  Coenzyme Q10 (CO Q 10 PO) Take by mouth.    [provider]  fluticasone (FLONASE) 50 MCG/ACT nasal spray Place 2 sprays into both nostrils every other day.    [provider]  gabapentin (NEURONTIN) 600 MG tablet Take 2 tablets (1,200 mg total) by mouth 2 (two) times daily. 12/28/20   Jamse Arn, MD  Magnesium 500 MG TABS Take by mouth daily.    [provider]  Melatonin 3 MG TABS Take 5 mg by mouth.     [provider]  methocarbamol (ROBAXIN) 500 MG tablet Take 2 tablets (1,000 mg total) by mouth 3 (three) times daily. 12/28/20   Jamse Arn, MD  Multiple Vitamin (MULTIVITAMIN) tablet Take 1 tablet by mouth daily.    [provider]  omeprazole (PRILOSEC) 20 MG capsule Take 20 mg by mouth every other day.    [provider]  rosuvastatin (CRESTOR) 5 MG tablet Take 5 mg by mouth daily.    [provider]  Vitamin D, Ergocalciferol, (DRISDOL) 1.25 MG (50000 UNIT) CAPS capsule Take 1 capsule (50,000 Units total) by mouth every 7 (seven) days. 08/07/20   Mellody Dance, DO    Allergies    Adhesive [tape], Codeine sulfate, and Latex  Review of Systems   Review of Systems  Constitutional:  Negative for  chills and fever.  HENT:  Negative for congestion and facial swelling.   Eyes:  Negative for discharge and visual disturbance.  Respiratory:  Negative for shortness of breath.   Cardiovascular:  Negative for chest pain and palpitations.  Gastrointestinal:  Positive for blood in stool and hematochezia. Negative for abdominal pain, diarrhea and vomiting.  Musculoskeletal:  Negative for arthralgias and myalgias.  Skin:  Negative for color change and rash.  Neurological:  Negative for tremors, syncope and headaches.  Psychiatric/Behavioral:  Negative for confusion and dysphoric mood.    Physical Exam Updated Vital Signs BP 132/62   Pulse 74   Temp 98.1 F (36.7 C) (Oral)   Resp 18   Ht 5\' 7"  (1.702 m)   Wt 86.2 kg   SpO2 98%   BMI 29.76 kg/m   Physical Exam Vitals and nursing note reviewed.  Constitutional:      Appearance: He is well-developed.  HENT:     Head: Normocephalic and atraumatic.  Eyes:     Pupils: Pupils are equal, round, and reactive to light.  Neck:     Vascular: No JVD.  Cardiovascular:     Rate and Rhythm: Normal rate and regular rhythm.     Heart sounds: No murmur heard.   No friction rub. No gallop.  Pulmonary:     Effort: No respiratory distress.     Breath sounds: No wheezing.  Abdominal:     General: There is no distension.     Tenderness: There is no abdominal tenderness. There is no guarding or rebound.  Genitourinary:    Comments: Gross melena on rectal exam.  Hemorrhoids without bleeding. Musculoskeletal:        General: Normal range of motion.     Cervical back: Normal range of motion and neck supple.  Skin:    Coloration: Skin is not pale.     Findings: No rash.  Neurological:     Mental Status: He is alert and oriented to person, place, and time.  Psychiatric:        Behavior: Behavior normal.    ED Results / Procedures / Treatments   Labs (all labs ordered are listed, but only abnormal results are displayed) Labs Reviewed   COMPREHENSIVE METABOLIC PANEL - Abnormal; Notable for the following components:      Result Value   Glucose, Bld 155 (*)    BUN 26 (*)    All other components within normal limits  CBC - Abnormal; Notable for the following components:   RBC 3.59 (*)    Hemoglobin 11.0 (*)    HCT 32.9 (*)    Platelets 126 (*)    All other components within normal limits  RESP PANEL BY RT-PCR (FLU A&B, COVID) ARPGX2  OCCULT BLOOD X 1 CARD TO LAB, STOOL    EKG None  Radiology No results found.  Procedures Procedures   Medications Ordered in ED Medications  pantoprazole (PROTONIX) injection 40 mg (40 mg Intravenous Given 04/20/21 1138)    ED Course  I have reviewed the triage vital signs and the nursing notes.  Pertinent labs & imaging results that were available during my care of the patient were reviewed by me and considered in my medical decision making (see chart for details).    MDM Rules/Calculators/A&P                           75 yo M with a chief complaints of dark stool.  Going on for a couple days now.  Patient's hemoglobin is about 3 g below his baseline.  On baby aspirin.  Mild tachycardia on arrival.  Will discuss with medicine for admission.  The patients results and plan were reviewed and discussed.   Any x-rays performed were independently reviewed by myself.   Differential diagnosis were considered with the presenting HPI.  Medications  pantoprazole (PROTONIX) injection 40 mg (40 mg Intravenous Given 04/20/21 1138)    Vitals:  04/20/21 1100 04/20/21 1130 04/20/21 1145 04/20/21 1200  BP: 127/71 118/63 128/73 132/62  Pulse: 82 81 77 74  Resp:   18   Temp:      TempSrc:      SpO2: 97% 98% 97% 98%  Weight:      Height:        Final diagnoses:  Melena    Admission/ observation were discussed with the admitting physician, patient and/or family and they are comfortable with the plan.   Final Clinical Impression(s) / ED Diagnoses Final diagnoses:  Melena     Rx / DC Orders ED Discharge Orders     None        Deno Etienne, DO 04/20/21 1238

## 2021-04-20 NOTE — H&P (Signed)
HPI  Donald Pope KCL:[id number] DOB: Sep 30, 1945 DOA: 04/20/2021  PCP: [name], MD   Chief Complaint: GI bleed  HPI:  61 white male Nonobstructive CAD on cath 2005, OSA on CPAP, BPH, HLD, PLIF L4-L5 05/27/2013. Had episodic hemorrhoidal bleeding 2021 and was seen for this had colonoscopy and reportedly precancerous polyps were found by Dr. Hilarie Fredrickson Reports 5 to 7 days bloating and explosive diarrhea consisting primarily of blood-no change in appetite, no dysphagia, nausea vomiting, no abdominal pain No recent change in meds or increasing nonsteroidals, is on aspirin No weight loss, no skin itching or other constitutional findings  Came to [facility] referred for admission GI consulted  Review of Systems:    Pertinent +'s: Dark stool Pertinent -"s: Chest pain fever chills nausea blurred vision double vision unilateral weakness strokelike symptoms lower extremity swelling rash ill contacts  ED Course: Patient kept n.p.o. given Protonix 40, occult blood ordered, 2 large-bore IVs placed    Past Medical History:  Diagnosis Date   Allergy    Anxiety    Back pain    Benign prostatic hypertrophy    possible   Bilateral leg cramps    CAD, NATIVE VESSEL 10/23/2009   Chest pain    DEPRESSION 09/13/2008   Depression    False positive stress test    history of, suggestive of inferior ischemia   GERD 09/13/2008   HYPERLIPIDEMIA 09/13/2008   Meniere's disease    OBSTRUCTIVE SLEEP APNEA 10/23/2009   cpap   RBBB 10/26/2009   [place]   Sciatic nerve pain    Sleep apnea    Cpap   SOB (shortness of breath)    Spinal stenosis of lumbar region 08/18/2012   THROMBOCYTOPENIA 09/13/2008   Past Surgical History:  Procedure Laterality Date   CARDIAC CATHETERIZATION     10 yrs ago   COLONOSCOPY  06/27/2016   MAXIMUM ACCESS (MAS)POSTERIOR LUMBAR INTERBODY FUSION (PLIF) 1 LEVEL N/A 05/27/2013   Procedure: FOR MAXIMUM ACCESS (MAS) POSTERIOR LUMBAR INTERBODY FUSION (PLIF) 1  LEVEL ,LUMBAR FOUR-FIVE;  Surgeon: Eustace Katzenstein, MD;  Location: [facility];  Service: Neurosurgery;  Laterality: N/A;  FOR MAXIMUM ACCESS (MAS) POSTERIOR LUMBAR INTERBODY FUSION (PLIF) 1 LEVEL ,LUMBAR FOUR-FIVE   NASAL POLYP EXCISION     TONSILLECTOMY      reports that he has never smoked. He has never used smokeless tobacco. He reports current alcohol use of about 1.0 standard drink per week. He reports that he does not use drugs.  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 Authorizing Provider  amitriptyline (ELAVIL) 10 MG tablet TAKE 1 TABLET BY MOUTH EVERYDAY AT BEDTIME 10/05/20   Jamse Arn, MD  aspirin 81 MG chewable tablet CHEW ONE  TABLET BY MOUTH DAILY 08/04/14   [provider]  cetirizine (ZYRTEC) 10 MG tablet Takes one tablet every other day.    [provider]  cholecalciferol (VITAMIN D) 1000 UNITS tablet Take 1,000 Units by mouth daily.     [provider]  citalopram (CELEXA) 10 MG tablet Take 1 tablet (10 mg total) by mouth daily. 04/09/21   Burchette, Alinda Sierras, MD  Coenzyme Q10 (CO Q 10 PO) Take by mouth.    [provider]  fluticasone (FLONASE) 50 MCG/ACT nasal spray Place 2 sprays into both nostrils every other day.    [provider]  gabapentin (NEURONTIN) 600 MG tablet Take 2 tablets (1,200 mg total) by mouth 2 (two) times daily. 12/28/20   Jamse Arn, MD  Magnesium 500 MG TABS Take by mouth daily.    [provider]  Melatonin 3 MG TABS Take 5 mg by mouth.     [provider]  methocarbamol (ROBAXIN) 500 MG tablet Take 2 tablets (1,000 mg total) by mouth 3 (three) times daily. 12/28/20   Jamse Arn, MD  Multiple Vitamin (MULTIVITAMIN) tablet Take 1 tablet by mouth daily.    [provider]  omeprazole (PRILOSEC) 20 MG capsule Take 20 mg by mouth every other day.    [provider]  rosuvastatin (CRESTOR) 5 MG tablet Take 5 mg by mouth daily.    [provider]  Vitamin D, Ergocalciferol, (DRISDOL) 1.25 MG (50000 UNIT) CAPS capsule Take 1 capsule (50,000 Units total) by mouth every 7 (seven) days. 08/07/20   Mellody Dance, DO    Physical Exam:  Vitals:   04/20/21 1430 04/20/21 1602  BP: 140/73 138/77  Pulse: 74 78  Resp:  19  Temp:  98.2 F (36.8 C)  SpO2: 99% 99%    Younger than stated age white male no distress no icterus no pallor neck soft supple no lymphadenopathy CTA B no added sound rales rhonchi S1-S2 no murmur no rub no gallop Abdomen soft no rebound no guarding no HSM Power 5/5 rest of neuro deferred moving all 4 limbs equally smile symmetric No rash no edema on peripheral leg exam Pulses good in DPs Psych euthymic coherent pleasant   I have personally reviewed following labs and imaging studies  Labs:  Hemoglobin down from 1410 2820 21-11.0 Platelets 126 which seems like his baseline BUN/creatinine 26/1.1 up from baseline of 17/1.0   Imaging studies:  No imaging performed  Medical tests:  EKG independently reviewed: EKG not performed  Test discussed with performing physician: No   Decision to obtain old records:  Yes  Review and summation of old records:  Yes  Principal Problem:   GI bleed   Assessment/Plan Probable lower GI bleed Etiology sounds like he could have lower GI bleed >upper although BUN/creatinine are  elevated Placed on clear liquids Started on Protonix IV twice daily Trend CBC daily as no acute bleeding at this time Gastroenterology consult input appreciated in advance OSA on CPAP Resume CPAP at autotitration settings Mild AKI on admission Start IV fluids D5/NS 75 cc/H, repeat labs a.m. Depression/chronic pain from PLIF in 2017 Resumed Elavil 10, Celexa 10 Continue gabapentin 1200 twice daily, continue Robaxin 1000 3 times daily    Severity of Illness: The appropriate patient status for this patient is INPATIENT. Inpatient status is judged to be reasonable and necessary in order to provide the required intensity of service to ensure the patient's safety. The patient's presenting symptoms, physical  exam findings, and initial radiographic and laboratory data in the context of their chronic comorbidities is felt to place them at high risk for further clinical deterioration. Furthermore, it is not anticipated that the patient will be medically stable for discharge from the hospital within 2 midnights of admission.   * I certify that at the point of admission it is my clinical judgment that the patient will require inpatient hospital care spanning beyond 2 midnights from the point of admission due to high intensity of service, high risk for further deterioration and high frequency of surveillance required.*   DVT prophylaxis: SCD Code Status: Full Family Communication: None at bedside Consults called: Gastroenterology  Time spent: 46 minutes  Verlon Au, MD Jerl Mina my NP partners at night for Care related issues] [facility] --[facility] [employer] [facility] , [email or web address]; password [facility]  04/20/2021, 4:15 PM

## 2021-04-20 NOTE — ED Notes (Signed)
Called [facility] for consult @ 11:20, dx Rectal Bleeding

## 2021-04-20 NOTE — ED Triage Notes (Signed)
Rectal bleeding for 2 days, blood mixed in with stool, calls it more blood that stool.

## 2021-04-20 NOTE — Consult Note (Addendum)
Consultation  Referring Provider:     [facility] Primary Care Physician:  [name], MD Primary Gastroenterologist:      Dr. Hilarie Fredrickson Reason for Consultation:     GI bleed     Impression / Plan:   Hematochezia, suspect lower GI bleeding from diverticulosis Decreased hemoglobin-acute blood loss anemia  Plan for supportive care at this time.  Fluids transfuse as needed if hemoglobin gets below 8-7.  Serial hemoglobins and will do an INR for completeness tomorrow  Reserve colonoscopy or other studies for deterioration and need for more information.  He has a history of diverticulosis and had a colonoscopy in March 2021 so I do not think we need to pursue a colonoscopy based upon the available evidence that is very consistent with bleeding from diverticulosis.  He may have clear liquids  I appreciate the opportunity to care for this patient. Gatha Mayer, MD, [facility] 04/20/2021 6:44 PM           HPI:   Donald Pope is a 75 y.o. male with a history of colonic diverticulosis and colon polyps (followed by Dr. Hilarie Fredrickson) who has had several days of loose bloody stools.  Gassy bloated and stools that sort of explode.  The stools have been all bloody there is been no melena.  He does not use anticoagulants or antiplatelet agents and has not been using NSAIDs.  A couple of years ago he had a similar episode that was just a one-time event and his primary care provider told him he might of been hemorrhoids.  He denies significant abdominal pain.  GI review of systems is otherwise negative as far as the upper GI tract.  He has not had presyncope and really does not feel unwell no chest pain respiratory difficulty either.  There is a longstanding history of very mild thrombocytopenia.   Colonoscopy history:  March 2021 3 polyps maximum 8 mm, SSP adenoma and hyperplastic descending and sigmoid diverticulosis and internal hemorrhoids  February 2018 3  polyps, adenoma and SSP and diverticulosis and hemorrhoids as before  October 2014 hyperplastic polyp diverticulosis   September 2013 ileocecal valve SSA/P with diverticulosis Past Medical History:  Diagnosis Date   Allergy    Anxiety    Back pain    Benign prostatic hypertrophy    possible   Bilateral leg cramps    CAD, NATIVE VESSEL 10/23/2009   Chest pain    DEPRESSION 09/13/2008   Depression    False positive stress test    history of, suggestive of inferior ischemia   GERD 09/13/2008   HYPERLIPIDEMIA 09/13/2008   Meniere's disease    OBSTRUCTIVE SLEEP APNEA 10/23/2009   cpap   RBBB 10/26/2009   [place]   Sciatic nerve pain    Sleep apnea    Cpap   SOB (shortness of breath)    Spinal stenosis of lumbar region 08/18/2012   THROMBOCYTOPENIA 09/13/2008    Past Surgical History:  Procedure Laterality Date   CARDIAC CATHETERIZATION     10 yrs ago   COLONOSCOPY  06/27/2016   MAXIMUM ACCESS (MAS)POSTERIOR LUMBAR INTERBODY FUSION (PLIF) 1 LEVEL N/A 05/27/2013   Procedure: FOR MAXIMUM ACCESS (MAS) POSTERIOR LUMBAR INTERBODY FUSION (PLIF) 1 LEVEL ,LUMBAR FOUR-FIVE;  Surgeon: Eustace Poyser, MD;  Location: [facility];  Service: Neurosurgery;  Laterality: N/A;  FOR MAXIMUM ACCESS (MAS) POSTERIOR LUMBAR INTERBODY FUSION (PLIF) 1 LEVEL ,LUMBAR FOUR-FIVE   NASAL POLYP EXCISION  TONSILLECTOMY      Family History  Problem Relation Age of Onset   Heart disease Mother 72       CHF   Heart disease Father    COPD Father    Colon polyps Father    Alcoholism Father    Hypertension Sister    Hyperlipidemia Sister    Stomach cancer Neg Hx    Colon cancer Neg Hx    Esophageal cancer Neg Hx    Ulcerative colitis Neg Hx     Social History   Tobacco Use   Smoking status: Never   Smokeless tobacco: Never  Vaping Use   Vaping Use: Never used  Substance Use Topics   Alcohol use: Yes    Alcohol/week: 1.0 standard drink    Types: 1 Glasses of wine per week    Comment: 4-5 times a week    Drug use: No    Prior to Admission medications   Medication Sig Start Date End Date Taking? Authorizing Provider  amitriptyline (ELAVIL) 10 MG tablet TAKE 1 TABLET BY MOUTH EVERYDAY AT BEDTIME 10/05/20   Jamse Arn, MD  aspirin 81 MG chewable tablet CHEW ONE TABLET BY MOUTH DAILY 08/04/14   [provider]  cetirizine (ZYRTEC) 10 MG tablet Takes one tablet every other day.    [provider]  cholecalciferol (VITAMIN D) 1000 UNITS tablet Take 1,000 Units by mouth daily.     [provider]  citalopram (CELEXA) 10 MG tablet Take 1 tablet (10 mg total) by mouth daily. 04/09/21   Burchette, Alinda Sierras, MD  Coenzyme Q10 (CO Q 10 PO) Take by mouth.    [provider]  fluticasone (FLONASE) 50 MCG/ACT nasal spray Place 2 sprays into both nostrils every other day.    [provider]  gabapentin (NEURONTIN) 600 MG tablet Take 2 tablets (1,200 mg total) by mouth 2 (two) times daily. 12/28/20   Jamse Arn, MD  Magnesium 500 MG TABS Take by mouth daily.    [provider]  Melatonin 3 MG TABS Take 5 mg by mouth.     [provider]  methocarbamol (ROBAXIN) 500 MG tablet Take 2 tablets (1,000 mg total) by mouth 3 (three) times daily. 12/28/20   Jamse Arn, MD  Multiple Vitamin (MULTIVITAMIN) tablet Take 1 tablet by mouth daily.    [provider]  omeprazole (PRILOSEC) 20 MG capsule Take 20 mg by mouth every other day.    [provider]  rosuvastatin (CRESTOR) 5 MG tablet Take 5 mg by mouth daily.    [provider]  Vitamin D, Ergocalciferol, (DRISDOL) 1.25 MG (50000 UNIT) CAPS capsule Take 1 capsule (50,000 Units total) by mouth every 7 (seven) days. 08/07/20   Mellody Dance, DO    Current Facility-Administered Medications  Medication Dose Route Frequency Provider Last Rate Last Admin   amitriptyline (ELAVIL) tablet 10 mg  10 mg Oral QHS Nita Sells, MD       [START ON 04/21/2021]  citalopram (CELEXA) tablet 10 mg  10 mg Oral Daily Samtani, Jai-Gurmukh, MD       dextrose 5 %-0.9 % sodium chloride infusion   Intravenous Continuous Nita Sells, MD 75 mL/hr at 04/20/21 1803 New Bag at 04/20/21 1803   [START ON 04/21/2021] fluticasone (FLONASE) 50 MCG/ACT nasal spray 2 spray  2 spray Each Nare QODAY Nita Sells, MD       gabapentin (NEURONTIN) tablet 1,200 mg  1,200 mg Oral BID Nita Sells,  MD       melatonin tablet 5 mg  5 mg Oral QHS Nita Sells, MD       methocarbamol (ROBAXIN) tablet 1,000 mg  1,000 mg Oral TID Nita Sells, MD       pantoprazole (PROTONIX) EC tablet 40 mg  40 mg Oral BID Nita Sells, MD        Allergies as of 04/20/2021 - Review Complete 04/20/2021  Allergen Reaction Noted   Adhesive [tape] Rash 05/17/2013   Codeine sulfate Other (See Comments) 09/13/2008   Latex Rash 05/17/2013     Review of Systems:    This is positive for those things mentioned in the HPI. All other review of systems are negative.       Physical Exam:  Vital signs in last 24 hours: Temp:  [98.1 F (36.7 C)-98.2 F (36.8 C)] 98.2 F (36.8 C) (12/09 1602) Pulse Rate:  [70-104] 78 (12/09 1602) Resp:  [17-19] 19 (12/09 1602) BP: (118-140)/(52-78) 138/77 (12/09 1602) SpO2:  [97 %-100 %] 99 % (12/09 1602) Weight:  [86.2 kg] 86.2 kg (12/09 1023)    General:  Well-developed, well-nourished and in no acute distress Eyes:  anicteric.  Lungs: Clear to auscultation bilaterally. Heart:   S1S2, no rubs, murmurs, gallops. Abdomen: Perhaps slightly distended but soft, non-tender, no hepatosplenomegaly, hernia, or mass and BS+ and slightly increased.  Rectal: There is red stool in the perianal area and on the gloved finger.  No melena.  No mass and nontender.  Anoderm notable for very tiny external hemorrhoid/tags that are fleshy. Extremities:   no edema Skin   no rash. Neuro:  A&O x 3.  Psych:  appropriate mood and   Affect.   Data Reviewed:   LAB RESULTS: Recent Labs    04/20/21 1027  WBC 6.9  HGB 11.0*  HCT 32.9*  PLT 126*   BMET Recent Labs    04/20/21 1027  NA 137  K 3.6  CL 104  CO2 23  GLUCOSE 155*  BUN 26*  CREATININE 1.17  CALCIUM 8.9   LFT Recent Labs    04/20/21 1027  PROT 7.0  ALBUMIN 4.5  AST 20  ALT 14  ALKPHOS 42  BILITOT 0.5         Thanks   LOS: 0 days   @Maniya Donovan  Simonne Maffucci, MD, [facility] @  04/20/2021, 6:42 PM

## 2021-04-21 ENCOUNTER — Inpatient Hospital Stay (HOSPITAL_COMMUNITY): Payer: PPO

## 2021-04-21 LAB — COMPREHENSIVE METABOLIC PANEL
ALT: 14 U/L (ref 0–44)
AST: 20 U/L (ref 15–41)
Albumin: 3.4 g/dL — ABNORMAL LOW (ref 3.5–5.0)
Alkaline Phosphatase: 35 U/L — ABNORMAL LOW (ref 38–126)
Anion gap: 8 (ref 5–15)
BUN: 17 mg/dL (ref 8–23)
CO2: 23 mmol/L (ref 22–32)
Calcium: 8.3 mg/dL — ABNORMAL LOW (ref 8.9–10.3)
Chloride: 106 mmol/L (ref 98–111)
Creatinine, Ser: 1.04 mg/dL (ref 0.61–1.24)
GFR, Estimated: >60 mL/min (ref >60)
Glucose, Bld: 112 mg/dL — ABNORMAL HIGH (ref 70–99)
Potassium: 3.7 mmol/L (ref 3.5–5.1)
Sodium: 137 mmol/L (ref 135–145)
Total Bilirubin: 0.6 mg/dL (ref 0.3–1.2)
Total Protein: 5.6 g/dL — ABNORMAL LOW (ref 6.5–8.1)

## 2021-04-21 LAB — CBC
HCT: 27.6 % — ABNORMAL LOW (ref 39.0–52.0)
HCT: 27.2 % — ABNORMAL LOW (ref 39.0–52.0)
HCT: 24.9 % — ABNORMAL LOW (ref 39.0–52.0)
Hemoglobin: 9.3 g/dL — ABNORMAL LOW (ref 13.0–17.0)
Hemoglobin: 9.2 g/dL — ABNORMAL LOW (ref 13.0–17.0)
Hemoglobin: 8.3 g/dL — ABNORMAL LOW (ref 13.0–17.0)
MCH: 31.2 pg (ref 26.0–34.0)
MCH: 32.1 pg (ref 26.0–34.0)
MCH: 31.2 pg (ref 26.0–34.0)
MCHC: 33.7 g/dL (ref 30.0–36.0)
MCHC: 33.8 g/dL (ref 30.0–36.0)
MCHC: 33.3 g/dL (ref 30.0–36.0)
MCV: 92.6 fL (ref 80.0–100.0)
MCV: 94.8 fL (ref 80.0–100.0)
MCV: 93.6 fL (ref 80.0–100.0)
Platelets: 102 10*3/uL — ABNORMAL LOW (ref 150–400)
Platelets: 104 10*3/uL — ABNORMAL LOW (ref 150–400)
Platelets: 99 10*3/uL — ABNORMAL LOW (ref 150–400)
RBC: 2.98 MIL/uL — ABNORMAL LOW (ref 4.22–5.81)
RBC: 2.87 MIL/uL — ABNORMAL LOW (ref 4.22–5.81)
RBC: 2.66 MIL/uL — ABNORMAL LOW (ref 4.22–5.81)
RDW: 13 % (ref 11.5–15.5)
RDW: 13.1 % (ref 11.5–15.5)
RDW: 13.2 % (ref 11.5–15.5)
WBC: 8.1 10*3/uL (ref 4.0–10.5)
WBC: 7.1 10*3/uL (ref 4.0–10.5)
WBC: 6.7 10*3/uL (ref 4.0–10.5)
nRBC: 0 % (ref 0.0–0.2)
nRBC: 0 % (ref 0.0–0.2)
nRBC: 0 % (ref 0.0–0.2)

## 2021-04-21 MED ORDER — IOHEXOL 350 MG/ML SOLN
100.0000 mL | Freq: Once | INTRAVENOUS | Status: AC | PRN
  Administered 2021-04-21: 100 mL via INTRAVENOUS

## 2021-04-21 NOTE — Progress Notes (Addendum)
[facility] Progress Note  CC:  GI bleed  Subjective:  Still with bleeding, about 3 episodes since 3 AM that he reports is quite a bit each time.  No pain.  No nausea or vomiting.  Last episode was about 20 mins before my visit, deep red with small clots.    Objective:  Vital signs in last 24 hours: Temp:  [97.9 F (36.6 C)-98.2 F (36.8 C)] 97.9 F (36.6 C) (12/10 0722) Pulse Rate:  [66-78] 66 (12/10 0722) Resp:  [18-19] 18 (12/10 0722) BP: (122-150)/(52-78) 140/75 (12/10 0722) SpO2:  [98 %-100 %] 98 % (12/10 0722)   General:  Alert, Well-developed, in NAD Heart:  Regular rate and rhythm; no murmurs Pulm:  CTAB.  No W/R/R. Abdomen:  Soft, non-distended.  BS present.  Non-tender. Extremities:  Without edema. Neurologic:  Alert and oriented x 4;  grossly normal neurologically. Psych:  Alert and cooperative. Normal mood and affect.  Intake/Output from previous day: 12/09 0701 - 12/10 0700 In: 972.5 [P.O.:240; I.V.:732.5] Out: -   Lab Results: Recent Labs    04/20/21 1027 04/21/21 0128  WBC 6.9 8.1  HGB 11.0* 9.3*  HCT 32.9* 27.6*  PLT 126* 102*   BMET Recent Labs    04/20/21 1027 04/21/21 0128  NA 137 137  K 3.6 3.7  CL 104 106  CO2 23 23  GLUCOSE 155* 112*  BUN 26* 17  CREATININE 1.17 1.04  CALCIUM 8.9 8.3*   LFT Recent Labs    04/21/21 0128  PROT 5.6*  ALBUMIN 3.4*  AST 20  ALT 14  ALKPHOS 35*  BILITOT 0.6   PT/INR Recent Labs    04/20/21 1937  LABPROT 14.6  INR 1.1   Assessment / Plan: *Hematochezia, suspect lower GI bleeding from diverticulosis.  Certainly ulcer disease is in the differential being that he uses 3 Ibuprofen daily, but his bleeding and clinical picture sounds diverticular.  He describes no melena. *Decreased hemoglobin-acute blood loss anemia.  Hgb 9.3 grams down from 11.0 grams yesterday.   Plan for supportive care at this time.  Fluids transfuse as needed if hemoglobin gets below 7-8 gram range.   Serial hemoglobins.   Will plan for CTA STAT to see if we can localize his bleeding with ongoing bleeding today (last episode about 20 mins before my visit).  Pantoprazole 40 mg PO BID ok for now.   LOS: 1 day   Laban Emperor. Zehr  04/21/2021, 12:12 PM   I have discussed the case with the APP, and that is the plan I formulated. I personally interviewed and examined the patient.  Ongoing lower GI bleeding with decreasing hemoglobin.  He is alert, conversational, hemodynamically stable and I had a discussion with him and his wife.  I suspect this is diverticular bleeding, and he is known to have left-sided diverticulosis from most recent colonoscopy.  Less likely to be some other nondiverticular source of lower GI bleeding such as AVM or neoplasia. BUN has normalized, patient not hypotensive or tachycardic, thus I think this is unlikely to be a rapid upper GI bleed  We discussed diverticular bleeding, most notably the types of diagnostic testing and the limitations and challenges of localization and stopping the bleeding.  Next plan is for stat CT angiogram and, if positive, ASAP assistance by interventional radiology for angiogram. Every 8 hours hemoglobin hematocrit, next check now  Colonoscopy typically of limited utility in this situation, however may be necessary if CT angiogram negative and  bleeding does not stop.   35 minutes were spent on this encounter (including chart review, history/exam, counseling/coordination of care, and documentation) > 50% of that time was spent on counseling and coordination of care.    Nelida Meuse III Office: [phone]

## 2021-04-21 NOTE — Progress Notes (Addendum)
PROGRESS NOTE   Donald Pope  MWN:[id number] DOB: 1945-05-14 DOA: 04/20/2021 PCP: [name], MD  Brief Narrative:  74 white male Nonobstructive CAD on cath 2005, OSA on CPAP, BPH, HLD, PLIF L4-L5 05/27/2013. Had episodic hemorrhoidal bleeding 2021 and was seen for this had colonoscopy and reportedly precancerous polyps were found by Dr. Hilarie Fredrickson Reports 5 to 7 days bloating and explosive diarrhea consisting primarily of blood-no change in appetite, no dysphagia, nausea vomiting, no abdominal pain No recent change in meds or increasing nonsteroidals, is on aspirin No weight loss, no skin itching or other constitutional findings   Came to [place] referred for admission GI consulted  Hospital-Problem based course  Probable lower GI bleed Anemia of blood loss Likely diverticular bleed Continue clear liquids only at this time Given drop in hemoglobin from 14-9 with bloating and mixed blood, CTA ordered --no active extravasation therefore no target to stop bleeding at this time Likely supportive management with transfusion support if hemoglobin falls below 7 No CAD Aspirin 81 on hold, goal-directed therapy as outpatient--likely will d/c asa going forward Mild thrombocytopenia Outpatient work-up including platelet smear as long as platelets remain above 70 OSA on CPAP Resume CPAP at autotitration settings Mild AKI on admission Start IV fluids D5/NS 75 cc/H, labs improved continue fluids Depression/chronic pain from PLIF in 2017 Resumed Elavil 10, Celexa 10 Continue gabapentin 1200 twice daily, continue Robaxin 1000 3 times daily  DVT prophylaxis: SCD Code Status: Full Family Communication: Discussed with patient and his wife at bedside and called patient subsequently Disposition:  Status is: Inpatient  Remains inpatient appropriate because: Hemodynamic stability       Consultants:  Gastroenterology  Procedures: CT angiogram abdomen  pelvis  Antimicrobials: None   Subjective: Continues to have unformed stool with bloating Last 2 stools no bleeding this pm No dizziness no chest pain no fever  Objective: Vitals:   04/20/21 2357 04/21/21 0512 04/21/21 0722 04/21/21 1501  BP: 134/68 132/75 140/75 139/65  Pulse: 68 69 66 66  Resp:   18 18  Temp: 98.2 F (36.8 C) 98.1 F (36.7 C) 97.9 F (36.6 C) 97.9 F (36.6 C)  TempSrc: Oral Oral Oral   SpO2: 100% 100% 98% 100%  Weight:      Height:        Intake/Output Summary (Last 24 hours) at 04/21/2021 1726 Last data filed at 04/21/2021 0400 Gross per 24 hour  Intake 732.54 ml  Output --  Net 732.54 ml   Filed Weights   04/20/21 1023  Weight: 86.2 kg    Examination:  Awake coherent pleasant in nad S1 S2 m/r/g, RRR Cta b Rom intact  No focal deficit-neuro intact  Data Reviewed: personally reviewed   CBC    Component Value Date/Time   WBC 8.1 04/21/2021 0128   RBC 2.98 (L) 04/21/2021 0128   HGB 9.3 (L) 04/21/2021 0128   HGB 14.0 03/09/2020 1057   HCT 27.6 (L) 04/21/2021 0128   HCT 40.9 03/09/2020 1057   PLT 102 (L) 04/21/2021 0128   PLT 126 (L) 03/09/2020 1057   MCV 92.6 04/21/2021 0128   MCV 91 03/09/2020 1057   MCH 31.2 04/21/2021 0128   MCHC 33.7 04/21/2021 0128   RDW 13.0 04/21/2021 0128   RDW 13.1 03/09/2020 1057   LYMPHSABS 1.8 03/09/2020 1057   MONOABS 0.7 06/16/2017 0853   EOSABS 0.1 03/09/2020 1057   BASOSABS 0.1 03/09/2020 1057   CMP Latest Ref Rng & Units 04/21/2021 04/20/2021  03/09/2020  Glucose 70 - 99 mg/dL 112(H) 155(H) 113(H)  BUN 8 - 23 mg/dL 17 26(H) 17  Creatinine 0.61 - 1.24 mg/dL 1.04 1.17 1.06  Sodium 135 - 145 mmol/L 137 137 139  Potassium 3.5 - 5.1 mmol/L 3.7 3.6 4.5  Chloride 98 - 111 mmol/L 106 104 101  CO2 22 - 32 mmol/L 23 23 23   Calcium 8.9 - 10.3 mg/dL 8.3(L) 8.9 9.2  Total Protein 6.5 - 8.1 g/dL 5.6(L) 7.0 7.1  Total Bilirubin 0.3 - 1.2 mg/dL 0.6 0.5 0.3  Alkaline Phos 38 - 126 U/L 35(L) 42 84  AST 15  - 41 U/L 20 20 27   ALT 0 - 44 U/L 14 14 23      Radiology Studies: CT Angio Abd/Pel w/ and/or w/o  Result Date: 04/21/2021 CLINICAL DATA:  GI bleed, lower EXAM: CTA ABDOMEN AND PELVIS WITHOUT AND WITH CONTRAST TECHNIQUE: Multidetector CT imaging of the abdomen and pelvis was performed using the standard protocol during bolus administration of intravenous contrast. Multiplanar reconstructed images and MIPs were obtained and reviewed to evaluate the vascular anatomy. CONTRAST:  169mL OMNIPAQUE IOHEXOL 350 MG/ML SOLN COMPARISON:  None. FINDINGS: VASCULAR Aorta: Aortic atherosclerosis.  No aneurysm or dissection. Celiac: Patent without evidence of aneurysm, dissection, vasculitis or significant stenosis. SMA: Patent without evidence of aneurysm, dissection, vasculitis or significant stenosis. Renals: Patent. Calcified plaque at the origin of both renal arteries. No dissection. IMA: Patent without evidence of aneurysm, dissection, vasculitis or significant stenosis. Inflow: Calcifications.  No aneurysm or dissection. Proximal Outflow: Bilateral common femoral and visualized portions of the superficial and profunda femoral arteries are patent without evidence of aneurysm, dissection, vasculitis or significant stenosis. Veins: No obvious venous abnormality within the limitations of this arterial phase study. Review of the MIP images confirms the above findings. NON-VASCULAR Lower chest: No acute abnormality. Hepatobiliary: Scattered hypodensities in the liver, the largest in the posterior right hepatic lobe measuring 1.4 cm, likely cysts. Gallbladder unremarkable. Pancreas: No focal abnormality or ductal dilatation. Spleen: No focal abnormality.  Normal size. Adrenals/Urinary Tract: 3.5 cm midpole right renal cysts. No hydronephrosis. Adrenal glands and urinary bladder unremarkable. Stomach/Bowel: No contrast extravasation to localize GI bleed. No bowel obstruction. Lymphatic: No adenopathy Reproductive: No visible  focal abnormality. Other: No free fluid or free air. Musculoskeletal: No acute bony abnormality IMPRESSION: VASCULAR No contrast extravasation seen within the colon or elsewhere in the bowel to localize GI bleed. Aortic atherosclerosis NON-VASCULAR No acute findings in the abdomen or pelvis. Electronically Signed   By: Rolm Baptise M.D.   On: 04/21/2021 17:22     Scheduled Meds:  amitriptyline  10 mg Oral QHS   citalopram  10 mg Oral Daily   fluticasone  2 spray Each Nare QODAY   gabapentin  1,200 mg Oral BID   melatonin  5 mg Oral QHS   methocarbamol  1,000 mg Oral TID   pantoprazole  40 mg Oral BID   Continuous Infusions:  dextrose 5 % and 0.9% NaCl 75 mL/hr at 04/21/21 0712     LOS: 1 day   Time spent: Advance, MD [facility] To contact the attending provider between 7A-7P or the covering provider during after hours 7P-7A, please log into the web site [email or web address] and access using universal [place] password for that web site. If you do not have the password, please call the hospital operator.  04/21/2021, 5:26 PM

## 2021-04-22 LAB — CBC
HCT: 24.4 % — ABNORMAL LOW (ref 39.0–52.0)
HCT: 26.9 % — ABNORMAL LOW (ref 39.0–52.0)
Hemoglobin: 8.1 g/dL — ABNORMAL LOW (ref 13.0–17.0)
Hemoglobin: 8.8 g/dL — ABNORMAL LOW (ref 13.0–17.0)
MCH: 30.9 pg (ref 26.0–34.0)
MCH: 30.9 pg (ref 26.0–34.0)
MCHC: 33.2 g/dL (ref 30.0–36.0)
MCHC: 32.7 g/dL (ref 30.0–36.0)
MCV: 93.1 fL (ref 80.0–100.0)
MCV: 94.4 fL (ref 80.0–100.0)
Platelets: 104 10*3/uL — ABNORMAL LOW (ref 150–400)
Platelets: 116 10*3/uL — ABNORMAL LOW (ref 150–400)
RBC: 2.62 MIL/uL — ABNORMAL LOW (ref 4.22–5.81)
RBC: 2.85 MIL/uL — ABNORMAL LOW (ref 4.22–5.81)
RDW: 13.4 % (ref 11.5–15.5)
RDW: 13.2 % (ref 11.5–15.5)
WBC: 6.2 10*3/uL (ref 4.0–10.5)
WBC: 7.4 10*3/uL (ref 4.0–10.5)
nRBC: 0 % (ref 0.0–0.2)
nRBC: 0 % (ref 0.0–0.2)

## 2021-04-22 MED ORDER — ALUM HYDROXIDE-MAG TRISILICATE 80-20 MG PO CHEW
1.0000 | CHEWABLE_TABLET | Freq: Three times a day (TID) | ORAL | Status: DC | PRN
  Filled 2021-04-22: qty 1

## 2021-04-22 MED ORDER — ALUM & MAG HYDROXIDE-SIMETH 200-200-20 MG/5ML PO SUSP
15.0000 mL | Freq: Three times a day (TID) | ORAL | Status: DC | PRN
  Administered 2021-04-22: 15 mL via ORAL
  Filled 2021-04-22: qty 30

## 2021-04-22 MED ORDER — ALUM HYDROXIDE-MAG TRISILICATE 80-20 MG PO CHEW: 2.0000 | CHEWABLE_TABLET | Freq: Three times a day (TID) | ORAL | Status: DC | PRN

## 2021-04-22 NOTE — Plan of Care (Signed)
  Problem: Health Behavior/Discharge Planning: Goal: Ability to manage health-related needs will improve Outcome: Progressing   

## 2021-04-22 NOTE — Plan of Care (Signed)

## 2021-04-22 NOTE — Progress Notes (Addendum)
[facility] Progress Note  CC:  GI bleed -hematochezia  Subjective:  Feeling much better.  No more bleeding since the episode before I had seen him yesterday.  No BM.  Objective:  Vital signs in last 24 hours: Temp:  [97.8 F (36.6 C)-98 F (36.7 C)] 98 F (36.7 C) (12/11 0900) Pulse Rate:  [65-70] 70 (12/11 0900) Resp:  [18] 18 (12/11 0900) BP: (127-141)/(65-71) 141/65 (12/11 0900) SpO2:  [93 %-100 %] 100 % (12/11 0900)   General:  Alert, Well-developed, in NAD Heart:  Regular rate and rhythm; no murmurs Pulm:  CTAB.  No W/R/R. Abdomen:  Soft, non-distended.  BS present.  Non-tender. Extremities:  Without edema. Neurologic:  Alert and oriented x 4;  grossly normal neurologically. Psych:  Alert and cooperative. Normal mood and affect.  Intake/Output this shift: Total I/O In: 125 [P.O.:125] Out: -   Lab Results: Recent Labs    04/21/21 1506 04/21/21 2206 04/22/21 0456  WBC 7.1 6.7 6.2  HGB 9.2* 8.3* 8.1*  HCT 27.2* 24.9* 24.4*  PLT 104* 99* 104*   BMET Recent Labs    04/20/21 1027 04/21/21 0128  NA 137 137  K 3.6 3.7  CL 104 106  CO2 23 23  GLUCOSE 155* 112*  BUN 26* 17  CREATININE 1.17 1.04  CALCIUM 8.9 8.3*   LFT Recent Labs    04/21/21 0128  PROT 5.6*  ALBUMIN 3.4*  AST 20  ALT 14  ALKPHOS 35*  BILITOT 0.6   PT/INR Recent Labs    04/20/21 1937  LABPROT 14.6  INR 1.1   CT Angio Abd/Pel w/ and/or w/o  Result Date: 04/21/2021 CLINICAL DATA:  GI bleed, lower EXAM: CTA ABDOMEN AND PELVIS WITHOUT AND WITH CONTRAST TECHNIQUE: Multidetector CT imaging of the abdomen and pelvis was performed using the standard protocol during bolus administration of intravenous contrast. Multiplanar reconstructed images and MIPs were obtained and reviewed to evaluate the vascular anatomy. CONTRAST:  163mL OMNIPAQUE IOHEXOL 350 MG/ML SOLN COMPARISON:  None. FINDINGS: VASCULAR Aorta: Aortic atherosclerosis.  No aneurysm or dissection. Celiac:  Patent without evidence of aneurysm, dissection, vasculitis or significant stenosis. SMA: Patent without evidence of aneurysm, dissection, vasculitis or significant stenosis. Renals: Patent. Calcified plaque at the origin of both renal arteries. No dissection. IMA: Patent without evidence of aneurysm, dissection, vasculitis or significant stenosis. Inflow: Calcifications.  No aneurysm or dissection. Proximal Outflow: Bilateral common femoral and visualized portions of the superficial and profunda femoral arteries are patent without evidence of aneurysm, dissection, vasculitis or significant stenosis. Veins: No obvious venous abnormality within the limitations of this arterial phase study. Review of the MIP images confirms the above findings. NON-VASCULAR Lower chest: No acute abnormality. Hepatobiliary: Scattered hypodensities in the liver, the largest in the posterior right hepatic lobe measuring 1.4 cm, likely cysts. Gallbladder unremarkable. Pancreas: No focal abnormality or ductal dilatation. Spleen: No focal abnormality.  Normal size. Adrenals/Urinary Tract: 3.5 cm midpole right renal cysts. No hydronephrosis. Adrenal glands and urinary bladder unremarkable. Stomach/Bowel: No contrast extravasation to localize GI bleed. No bowel obstruction. Lymphatic: No adenopathy Reproductive: No visible focal abnormality. Other: No free fluid or free air. Musculoskeletal: No acute bony abnormality IMPRESSION: VASCULAR No contrast extravasation seen within the colon or elsewhere in the bowel to localize GI bleed. Aortic atherosclerosis NON-VASCULAR No acute findings in the abdomen or pelvis. Electronically Signed   By: Rolm Baptise M.D.   On: 04/21/2021 17:22    Assessment / Plan: *Hematochezia, suspect lower  GI bleeding from diverticulosis.  Certainly ulcer disease is in the differential being that he uses 3 Ibuprofen daily, but his bleeding and clinical picture sounds diverticular.  He describes no melena.  CTA negative  on 12/10.  No further bleeding since morning of 12/10. *Decreased hemoglobin-acute blood loss anemia.  Hgb 8.1 grams today.   -Will advance diet. -Monitor Hgb. -Pantoprazole 40 mg PO BID for now.   LOS: 2 days   Donald Pope. Zehr  04/22/2021, 11:10 AM   I have taken an interval history, reviewed the chart and examined the patient. I agree with the Advanced Practitioner's note, impression and recommendations.  Majority the medical decision-making in the formulation of the assessment and plan were performed by me.  My additional thoughts are as follows:  Bleeding has stopped, he looks well other than pale. He is alert and conversational, has no symptoms other than gassiness.  His wife is at the bedside for the entire visit. Suspected diverticular bleeding, was going on a few days at home, now stopped.  Negative CT angiogram this admission.  I am not planning a colonoscopy at this point, as the bleeding is stopped and hopefully will remain so.  Colonoscopy often with limited utility in this situation, especially where he had a high-quality screening colonoscopy within the last 2 years, therefore risk of 9 diverticular finding a source of bleeding is low.  Regular diet, hemoglobin tomorrow.  If no GI bleeding, most likely discharge home. Our service will check on him tomorrow (Dr. Havery Moros attending)   Nelida Meuse III Office:[phone]

## 2021-04-22 NOTE — Progress Notes (Signed)
PROGRESS NOTE   Donald Pope  ZJI:[id number] DOB: 09/01/1945 DOA: 04/20/2021 PCP: [name], MD  Brief Narrative:  47 white male Nonobstructive CAD on cath 2005, OSA on CPAP, BPH, HLD, PLIF L4-L5 05/27/2013. Had episodic hemorrhoidal bleeding 2021 and was seen for this had colonoscopy and reportedly precancerous polyps were found by Dr. Hilarie Fredrickson Reports 5 to 7 days bloating and explosive diarrhea consisting primarily of blood-no change in appetite, no dysphagia, nausea vomiting, no abdominal pain No recent change in meds or increasing nonsteroidals, is on aspirin No weight loss, no skin itching or other constitutional findings   Came to [facility] referred for admission GI consulted  Hospital-Problem based course  Probable lower GI bleed Anemia of blood loss Likely diverticular bleed-no further dark stool in 24 h CTA ordered--no active extravasation therefore no target to stop bleeding at this time Hemoglobin stable in 8 range - no further bleeding - no plan for any colonoscopy Soft diet, if hemoglobin stable, ? D/c am No CAD Aspirin 81 on hold, goal-directed therapy as outpatient--likely will d/c asa going forward Mild thrombocytopenia Outpatient work-up including platelet smear as long as platelets remain above 70 OSA on CPAP Resume CPAP at autotitration settings Mild AKI on admission Rpt labs in am--likely saline lock after Depression/chronic pain from PLIF in 2017 Resumed Elavil 10, Celexa 10 Continue gabapentin 1200 twice daily, continue Robaxin 1000 3 times daily  DVT prophylaxis: SCD Code Status: Full Family Communication: Discussed with patient and wife Tomi Bamberger [phone] at bedside Disposition:  Status is: Inpatient  Remains inpatient appropriate because: Hemodynamic stability   Consultants:  Gastroenterology  Procedures: CT angiogram abdomen pelvis  Antimicrobials: None   Subjective:  No further bloody stool--no stool since  yesterday Feels fair No cp  Objective: Vitals:   04/21/21 1501 04/21/21 1958 04/22/21 0428 04/22/21 0900  BP: 139/65 131/71 127/70 (!) 141/65  Pulse: 66 70 65 70  Resp: 18 18 18 18   Temp: 97.9 F (36.6 C) 97.9 F (36.6 C) 97.8 F (36.6 C) 98 F (36.7 C)  TempSrc:  Oral Oral Oral  SpO2: 100% 93% 100% 100%  Weight:      Height:        Intake/Output Summary (Last 24 hours) at 04/22/2021 1556 Last data filed at 04/22/2021 0900 Gross per 24 hour  Intake 125 ml  Output --  Net 125 ml    Filed Weights   04/20/21 1023  Weight: 86.2 kg    Examination:  Awake coherent pleasant in nad S1 S2 m/r/g, RRR Cta b Abd soft nt nd no rebound slight swelling Rom intact    Data Reviewed: personally reviewed   CBC    Component Value Date/Time   WBC 7.4 04/22/2021 1242   RBC 2.85 (L) 04/22/2021 1242   HGB 8.8 (L) 04/22/2021 1242   HGB 14.0 03/09/2020 1057   HCT 26.9 (L) 04/22/2021 1242   HCT 40.9 03/09/2020 1057   PLT 116 (L) 04/22/2021 1242   PLT 126 (L) 03/09/2020 1057   MCV 94.4 04/22/2021 1242   MCV 91 03/09/2020 1057   MCH 30.9 04/22/2021 1242   MCHC 32.7 04/22/2021 1242   RDW 13.2 04/22/2021 1242   RDW 13.1 03/09/2020 1057   LYMPHSABS 1.8 03/09/2020 1057   MONOABS 0.7 06/16/2017 0853   EOSABS 0.1 03/09/2020 1057   BASOSABS 0.1 03/09/2020 1057   CMP Latest Ref Rng & Units 04/21/2021 04/20/2021 03/09/2020  Glucose 70 - 99 mg/dL 112(H) 155(H) 113(H)  BUN 8 -  23 mg/dL 17 26(H) 17  Creatinine 0.61 - 1.24 mg/dL 1.04 1.17 1.06  Sodium 135 - 145 mmol/L 137 137 139  Potassium 3.5 - 5.1 mmol/L 3.7 3.6 4.5  Chloride 98 - 111 mmol/L 106 104 101  CO2 22 - 32 mmol/L 23 23 23   Calcium 8.9 - 10.3 mg/dL 8.3(L) 8.9 9.2  Total Protein 6.5 - 8.1 g/dL 5.6(L) 7.0 7.1  Total Bilirubin 0.3 - 1.2 mg/dL 0.6 0.5 0.3  Alkaline Phos 38 - 126 U/L 35(L) 42 84  AST 15 - 41 U/L 20 20 27   ALT 0 - 44 U/L 14 14 23      Radiology Studies: CT Angio Abd/Pel w/ and/or w/o  Result Date:  04/21/2021 CLINICAL DATA:  GI bleed, lower EXAM: CTA ABDOMEN AND PELVIS WITHOUT AND WITH CONTRAST TECHNIQUE: Multidetector CT imaging of the abdomen and pelvis was performed using the standard protocol during bolus administration of intravenous contrast. Multiplanar reconstructed images and MIPs were obtained and reviewed to evaluate the vascular anatomy. CONTRAST:  163mL OMNIPAQUE IOHEXOL 350 MG/ML SOLN COMPARISON:  None. FINDINGS: VASCULAR Aorta: Aortic atherosclerosis.  No aneurysm or dissection. Celiac: Patent without evidence of aneurysm, dissection, vasculitis or significant stenosis. SMA: Patent without evidence of aneurysm, dissection, vasculitis or significant stenosis. Renals: Patent. Calcified plaque at the origin of both renal arteries. No dissection. IMA: Patent without evidence of aneurysm, dissection, vasculitis or significant stenosis. Inflow: Calcifications.  No aneurysm or dissection. Proximal Outflow: Bilateral common femoral and visualized portions of the superficial and profunda femoral arteries are patent without evidence of aneurysm, dissection, vasculitis or significant stenosis. Veins: No obvious venous abnormality within the limitations of this arterial phase study. Review of the MIP images confirms the above findings. NON-VASCULAR Lower chest: No acute abnormality. Hepatobiliary: Scattered hypodensities in the liver, the largest in the posterior right hepatic lobe measuring 1.4 cm, likely cysts. Gallbladder unremarkable. Pancreas: No focal abnormality or ductal dilatation. Spleen: No focal abnormality.  Normal size. Adrenals/Urinary Tract: 3.5 cm midpole right renal cysts. No hydronephrosis. Adrenal glands and urinary bladder unremarkable. Stomach/Bowel: No contrast extravasation to localize GI bleed. No bowel obstruction. Lymphatic: No adenopathy Reproductive: No visible focal abnormality. Other: No free fluid or free air. Musculoskeletal: No acute bony abnormality IMPRESSION: VASCULAR  No contrast extravasation seen within the colon or elsewhere in the bowel to localize GI bleed. Aortic atherosclerosis NON-VASCULAR No acute findings in the abdomen or pelvis. Electronically Signed   By: Rolm Baptise M.D.   On: 04/21/2021 17:22     Scheduled Meds:  amitriptyline  10 mg Oral QHS   citalopram  10 mg Oral Daily   fluticasone  2 spray Each Nare QODAY   gabapentin  1,200 mg Oral BID   melatonin  5 mg Oral QHS   methocarbamol  1,000 mg Oral TID   pantoprazole  40 mg Oral BID   Continuous Infusions:  dextrose 5 % and 0.9% NaCl 50 mL/hr at 04/22/21 1434     LOS: 2 days   Time spent: [place], MD [facility] To contact the attending provider between 7A-7P or the covering provider during after hours 7P-7A, please log into the web site [email or web address] and access using universal [place] password for that web site. If you do not have the password, please call the hospital operator.  04/22/2021, 3:56 PM

## 2021-04-23 ENCOUNTER — Ambulatory Visit: Payer: PPO

## 2021-04-23 LAB — CBC
HCT: 22.6 % — ABNORMAL LOW (ref 39.0–52.0)
Hemoglobin: 7.6 g/dL — ABNORMAL LOW (ref 13.0–17.0)
MCH: 31.5 pg (ref 26.0–34.0)
MCHC: 33.6 g/dL (ref 30.0–36.0)
MCV: 93.8 fL (ref 80.0–100.0)
Platelets: 105 10*3/uL — ABNORMAL LOW (ref 150–400)
RBC: 2.41 MIL/uL — ABNORMAL LOW (ref 4.22–5.81)
RDW: 13.2 % (ref 11.5–15.5)
WBC: 8.4 10*3/uL (ref 4.0–10.5)
nRBC: 0 % (ref 0.0–0.2)

## 2021-04-23 LAB — COMPREHENSIVE METABOLIC PANEL
ALT: 13 U/L (ref 0–44)
AST: 17 U/L (ref 15–41)
Albumin: 3.4 g/dL — ABNORMAL LOW (ref 3.5–5.0)
Alkaline Phosphatase: 33 U/L — ABNORMAL LOW (ref 38–126)
Anion gap: 8 (ref 5–15)
BUN: 12 mg/dL (ref 8–23)
CO2: 24 mmol/L (ref 22–32)
Calcium: 8.4 mg/dL — ABNORMAL LOW (ref 8.9–10.3)
Chloride: 106 mmol/L (ref 98–111)
Creatinine, Ser: 1.09 mg/dL (ref 0.61–1.24)
GFR, Estimated: >60 mL/min (ref >60)
Glucose, Bld: 97 mg/dL (ref 70–99)
Potassium: 3.6 mmol/L (ref 3.5–5.1)
Sodium: 138 mmol/L (ref 135–145)
Total Bilirubin: 0.7 mg/dL (ref 0.3–1.2)
Total Protein: 5.5 g/dL — ABNORMAL LOW (ref 6.5–8.1)

## 2021-04-23 NOTE — Discharge Summary (Signed)
Physician Discharge Summary  TYJAE ISSA BSJ:[id number] DOB: 01-22-1946 DOA: 04/20/2021  PCP: [name], MD  Admit date: 04/20/2021 Discharge date: 04/23/2021  Time spent: 37 minutes  Recommendations for Outpatient Follow-up:  Requires Chem-12 CBC in 1 week at PCP office Outpatient follow-up gastroenterology for diverticular bleed--- has been counseled to stay off of NSAIDs and discontinued aspirin this admission  Will need outpatient routine follow-up for incidental cysts of the liver Consider outpatient work-up of thrombocytopenia not emergently  Discharge Diagnoses:  MAIN problem for hospitalization   Diverticular lower intestinal bleed  anemia of acute blood loss.fem   Please see below for itemized issues addressed in [facility]ital- refer to other progress notes for clarity if needed  Discharge Condition: Improved  Diet recommendation: Soft  Filed Weights   04/20/21 1023  Weight: 86.2 kg    History of present illness:  34 white male Nonobstructive CAD on cath 2005, OSA on CPAP, BPH, HLD, PLIF L4-L5 05/27/2013. Had episodic hemorrhoidal bleeding 2021 and was seen for this had colonoscopy and reportedly precancerous polyps were found by Dr. Hilarie Fredrickson Reports 5 to 7 days bloating and explosive diarrhea consisting primarily of blood-no change in appetite, no dysphagia, nausea vomiting, no abdominal pain No recent change in meds or increasing nonsteroidals, is on aspirin No weight loss, no skin itching or other constitutional findings   Came to [facility] referred for admission GI consulted CT scan abdomen pelvis showed incidental liver lesion favored to be cyst  Hospital Course:  Probable lower GI bleed Anemia of blood loss Likely diverticular bleed-no further dark stool in 24 h CTA ordered--no active extravasation therefore no target to stop bleeding at this time Hemoglobin stable in 7-8 range on discharge Cleared by gastroenterology to discharge and  outpatient follow-up as per them Continue Prilosec as prior to admission No CAD d/c asa going forward Mild thrombocytopenia Outpatient work-up including platelet smear  OSA on CPAP Resume CPAP at autotitration settings Mild AKI on admission Rpt labs in am was on fluids during hospitalization which were discontinued day before discharge Depression/chronic 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 Loletha Carrow  Discharge Exam: Vitals:   04/22/21 1700 04/23/21 0734  BP: 137/66 (!) 153/73  Pulse: 75 77  Resp: 20 16  Temp: 98 F (36.7 C) 98.2 F (36.8 C)  SpO2: 100% 99%    Subj on day of d/c   Feels gassy-slight bloating-no stool since Saturday no bleeding  General Exam on discharge  Awake coherent no distress EOMI NCAT no focal deficit CTA repeated overload so no rales no rhonchi Abdomen soft nondistended no rebound no guarding ROM intact   Discharge Instructions   Discharge Instructions     Diet - low sodium heart healthy   Complete by: As directed    Discharge instructions   Complete by: As directed    Patient has been discontinued off of NSAIDs and aspirin this hospital stay and can resume prilose usual doses as an outpatientc  Outpatient follow-up with GI will be needed in the near there are some nonspecific cysts on CT Of the liver that -are benign and they do need periodic follow-up Please get labs in 1 week at your primary care physician and continue your other meds without change   Increase activity slowly   Complete by: As directed       Allergies as of 04/23/2021  Reactions   Adhesive [tape] Rash   Codeine Sulfate Other (See Comments)   urination problems   Latex Rash        Medication List     STOP taking these medications    aspirin 81 MG chewable tablet   cetirizine 10 MG tablet Commonly known as: ZYRTEC    fluocinonide 0.05 % external solution Commonly known as: LIDEX   Magnesium 500 MG Tabs   multivitamin tablet       TAKE these medications    amitriptyline 10 MG tablet Commonly known as: ELAVIL TAKE 1 TABLET BY MOUTH EVERYDAY AT BEDTIME What changed:  how much to take how to take this when to take this additional instructions   cholecalciferol 1000 units tablet Commonly known as: VITAMIN D Take 1,000 Units by mouth daily.   citalopram 10 MG tablet Commonly known as: CELEXA Take 1 tablet (10 mg total) by mouth daily.   CO Q 10 PO Take 1 tablet by mouth daily.   fluticasone 50 MCG/ACT nasal spray Commonly known as: FLONASE Place 2 sprays into both nostrils every other day.   gabapentin 600 MG tablet Commonly known as: Neurontin Take 2 tablets (1,200 mg total) by mouth 2 (two) times daily.   melatonin 5 MG Tabs Take 5 mg by mouth at bedtime.   methocarbamol 500 MG tablet Commonly known as: Robaxin Take 2 tablets (1,000 mg total) by mouth 3 (three) times daily.   omeprazole 20 MG capsule Commonly known as: PRILOSEC Take 20 mg by mouth every other day.   rosuvastatin 5 MG tablet Commonly known as: CRESTOR Take 5 mg by mouth daily.   Vitamin D (Ergocalciferol) 1.25 MG (50000 UNIT) Caps capsule Commonly known as: DRISDOL Take 1 capsule (50,000 Units total) by mouth every 7 (seven) days.       Allergies  Allergen Reactions   Adhesive [Tape] Rash   Codeine Sulfate Other (See Comments)    urination problems   Latex Rash    Follow-up Information     Willia Craze, NP Follow up on 05/16/2021.   Specialty: Gastroenterology Why: at 10:30 am Contact information: [place] [id number] [phone]                  The results of significant diagnostics from this hospitalization (including imaging, microbiology, ancillary and laboratory) are listed below for reference.    Significant Diagnostic Studies: DG Lumbar Spine  Complete  Result Date: 04/10/2021 CLINICAL DATA:  Back pain and lower extremity pain. EXAM: LUMBAR SPINE - COMPLETE 4+ VIEW COMPARISON:  Lumbar spine x-ray 12/14/2013. FINDINGS: L4-L5 posterior fusion hardware appears unchanged and uncomplicated. Disc spacer is present at this level, unchanged. Spinal alignment is within normal limits. There is no acute fracture. Disc spaces appear maintained. There are atherosclerotic calcifications of the aorta. Anterior endplate osteophytes are seen most significant at T11-T12. IMPRESSION: No acute bony abnormality. L4-L5 posterior fusion appears uncomplicated. Electronically Signed   By: Ronney Asters M.D.   On: 04/10/2021 22:40   CT Angio Abd/Pel w/ and/or w/o  Result Date: 04/21/2021 CLINICAL DATA:  GI bleed, lower EXAM: CTA ABDOMEN AND PELVIS WITHOUT AND WITH CONTRAST TECHNIQUE: Multidetector CT imaging of the abdomen and pelvis was performed using the standard protocol during bolus administration of intravenous contrast. Multiplanar reconstructed images and MIPs were obtained and reviewed to evaluate the vascular anatomy. CONTRAST:  132mL OMNIPAQUE IOHEXOL 350 MG/ML SOLN COMPARISON:  None. FINDINGS: VASCULAR Aorta: Aortic atherosclerosis.  No  aneurysm or dissection. Celiac: Patent without evidence of aneurysm, dissection, vasculitis or significant stenosis. SMA: Patent without evidence of aneurysm, dissection, vasculitis or significant stenosis. Renals: Patent. Calcified plaque at the origin of both renal arteries. No dissection. IMA: Patent without evidence of aneurysm, dissection, vasculitis or significant stenosis. Inflow: Calcifications.  No aneurysm or dissection. Proximal Outflow: Bilateral common femoral and visualized portions of the superficial and profunda femoral arteries are patent without evidence of aneurysm, dissection, vasculitis or significant stenosis. Veins: No obvious venous abnormality within the limitations of this arterial phase study. Review  of the MIP images confirms the above findings. NON-VASCULAR Lower chest: No acute abnormality. Hepatobiliary: Scattered hypodensities in the liver, the largest in the posterior right hepatic lobe measuring 1.4 cm, likely cysts. Gallbladder unremarkable. Pancreas: No focal abnormality or ductal dilatation. Spleen: No focal abnormality.  Normal size. Adrenals/Urinary Tract: 3.5 cm midpole right renal cysts. No hydronephrosis. Adrenal glands and urinary bladder unremarkable. Stomach/Bowel: No contrast extravasation to localize GI bleed. No bowel obstruction. Lymphatic: No adenopathy Reproductive: No visible focal abnormality. Other: No free fluid or free air. Musculoskeletal: No acute bony abnormality IMPRESSION: VASCULAR No contrast extravasation seen within the colon or elsewhere in the bowel to localize GI bleed. Aortic atherosclerosis NON-VASCULAR No acute findings in the abdomen or pelvis. Electronically Signed   By: Rolm Baptise M.D.   On: 04/21/2021 17:22    Microbiology: Recent Results (from the past 240 hour(s))  Resp Panel by RT-PCR (Flu A&B, Covid) Nasopharyngeal Swab     Status: None   Collection Time: 04/20/21 11:36 AM   Specimen: Nasopharyngeal Swab; Nasopharyngeal(NP) swabs in vial transport medium  Result Value Ref Range Status   SARS Coronavirus 2 by RT PCR NEGATIVE NEGATIVE Final    Comment: (NOTE) SARS-CoV-2 target nucleic acids are NOT DETECTED.  The SARS-CoV-2 RNA is generally detectable in upper respiratory specimens during the acute phase of infection. The lowest concentration of SARS-CoV-2 viral copies this assay can detect is 138 copies/mL. A negative result does not preclude SARS-Cov-2 infection and should not be used as the sole basis for treatment or other patient management decisions. A negative result may occur with  improper specimen collection/handling, submission of specimen other than nasopharyngeal swab, presence of viral mutation(s) within the areas targeted by  this assay, and inadequate number of viral copies(<138 copies/mL). A negative result must be combined with clinical observations, patient history, and epidemiological information. The expected result is Negative.  Fact Sheet for Patients:  EntrepreneurPulse.com.au  Fact Sheet for Healthcare Providers:  IncredibleEmployment.be  This test is no t yet approved or cleared by the [place] FDA and  has been authorized for detection and/or diagnosis of SARS-CoV-2 by FDA under an Emergency Use Authorization (EUA). This EUA will remain  in effect (meaning this test can be used) for the duration of the COVID-19 declaration under Section 564(b)(1) of the Act, 21 U.S.C.section 360bbb-3(b)(1), unless the authorization is terminated  or revoked sooner.       Influenza A by PCR NEGATIVE NEGATIVE Final   Influenza B by PCR NEGATIVE NEGATIVE Final    Comment: (NOTE) The Xpert Xpress SARS-CoV-2/FLU/RSV plus assay is intended as an aid in the diagnosis of influenza from Nasopharyngeal swab specimens and should not be used as a sole basis for treatment. Nasal washings and aspirates are unacceptable for Xpert Xpress SARS-CoV-2/FLU/RSV testing.  Fact Sheet for Patients: EntrepreneurPulse.com.au  Fact Sheet for Healthcare Providers: IncredibleEmployment.be  This test is not yet approved or cleared by the  [place] Architectural technologist and has been authorized for detection and/or diagnosis of SARS-CoV-2 by FDA under an Print production planner (EUA). This EUA will remain in effect (meaning this test can be used) for the duration of the COVID-19 declaration under Section 564(b)(1) of the Act, 21 U.S.C. section 360bbb-3(b)(1), unless the authorization is terminated or revoked.  Performed at [facility], [address] [place], [place] [address]      Labs: Basic Metabolic Panel: Recent Labs  Lab  04/20/21 1027 04/21/21 0128 04/23/21 0145  NA 137 137 138  K 3.6 3.7 3.6  CL 104 106 106  CO2 23 23 24   GLUCOSE 155* 112* 97  BUN 26* 17 12  CREATININE 1.17 1.04 1.09  CALCIUM 8.9 8.3* 8.4*   Liver Function Tests: Recent Labs  Lab 04/20/21 1027 04/21/21 0128 04/23/21 0145  AST 20 20 17   ALT 14 14 13   ALKPHOS 42 35* 33*  BILITOT 0.5 0.6 0.7  PROT 7.0 5.6* 5.5*  ALBUMIN 4.5 3.4* 3.4*   No results for input(s): LIPASE, AMYLASE in the last 168 hours. No results for input(s): AMMONIA in the last 168 hours. CBC: Recent Labs  Lab 04/21/21 1506 04/21/21 2206 04/22/21 0456 04/22/21 1242 04/23/21 0145  WBC 7.1 6.7 6.2 7.4 8.4  HGB 9.2* 8.3* 8.1* 8.8* 7.6*  HCT 27.2* 24.9* 24.4* 26.9* 22.6*  MCV 94.8 93.6 93.1 94.4 93.8  PLT 104* 99* 104* 116* 105*   Cardiac Enzymes: No results for input(s): CKTOTAL, CKMB, CKMBINDEX, TROPONINI in the last 168 hours. BNP: BNP (last 3 results) No results for input(s): BNP in the last 8760 hours.  ProBNP (last 3 results) No results for input(s): PROBNP in the last 8760 hours.  CBG: No results for input(s): GLUCAP in the last 168 hours.     Signed:  Nita Sells MD   [facility] 04/23/2021, 11:03 AM

## 2021-04-23 NOTE — Care Management Important Message (Signed)
Important Message  Patient Details  Name: Donald Pope MRN: [id number] Date of Birth: 17-Aug-1945   Medicare Important Message Given:  Yes     Georgia Baria 04/23/2021, 11:51 AM

## 2021-04-23 NOTE — Progress Notes (Signed)
Progress Note Hospital Day: 4  Chief Complaint:    GI bleeding     ASSESSMENT AND PLAN   Brief history: 75 year old male with nonobstructive CAD, HLD, OSA on CPAP, diverticulosis, colon polyps. See PMH for additional medical history. Admitted with GI bleed  # Hematochezia with acute blood loss anemia. CTA abd / pelvis negative for bleed. Suspected diverticular hemorrhage --Hgb 14 >> 9.2. No blood transfusion required --Last colonoscopy March 2021 (SSA + HP polyps, hemorrhoids and diverticulosis) --Stable for discharge from GI standpoint. Follow up with me 05/16/21 at 10:30am --Will repeat CBC at follow up appt --He can resume PPI as he was taking it prior to admission  # Liver lesions, Radiology favors them to be cysts, largest 1.4 cm  DIAGNOSTIC STUDIES THIS ADMISSION:   No endoscopic evaluations    SUBJECTIVE      No BMs / bleeding in > 24 hours. Tolerating diet.   OBJECTIVE     Scheduled inpatient medications:   amitriptyline  10 mg Oral QHS   citalopram  10 mg Oral Daily   fluticasone  2 spray Each Nare QODAY   gabapentin  1,200 mg Oral BID   melatonin  5 mg Oral QHS   methocarbamol  1,000 mg Oral TID   pantoprazole  40 mg Oral BID   Continuous inpatient infusions:   dextrose 5 % and 0.9% NaCl Stopped (04/22/21 2000)   PRN inpatient medications: alum & mag hydroxide-simeth  Vital signs in last 24 hours: Temp:  [98 F (36.7 C)-98.2 F (36.8 C)] 98.2 F (36.8 C) (12/12 0734) Pulse Rate:  [75-77] 77 (12/12 0734) Resp:  [16-20] 16 (12/12 0734) BP: (137-153)/(66-73) 153/73 (12/12 0734) SpO2:  [99 %-100 %] 99 % (12/12 0734)   No intake or output data in the 24 hours ending 04/23/21 0945   Physical Exam:  General: Alert male in NAD Heart:  Regular rate and rhythm, 1+ BLE edema Pulmonary: Normal respiratory effort Abdomen: Soft, nondistended, nontender. Normal bowel sounds.  Neurologic: Alert and oriented Psych: Pleasant. Cooperative.   Filed  Weights   04/20/21 1023  Weight: 86.2 kg    Intake/Output from previous day: 12/11 0701 - 12/12 0700 In: 125 [P.O.:125] Out: -  Intake/Output this shift: No intake/output data recorded.    Lab Results: Recent Labs    04/22/21 0456 04/22/21 1242 04/23/21 0145  WBC 6.2 7.4 8.4  HGB 8.1* 8.8* 7.6*  HCT 24.4* 26.9* 22.6*  PLT 104* 116* 105*   BMET Recent Labs    04/20/21 1027 04/21/21 0128 04/23/21 0145  NA 137 137 138  K 3.6 3.7 3.6  CL 104 106 106  CO2 23 23 24   GLUCOSE 155* 112* 97  BUN 26* 17 12  CREATININE 1.17 1.04 1.09  CALCIUM 8.9 8.3* 8.4*   LFT Recent Labs    04/23/21 0145  PROT 5.5*  ALBUMIN 3.4*  AST 17  ALT 13  ALKPHOS 33*  BILITOT 0.7   PT/INR Recent Labs    04/20/21 1937  LABPROT 14.6  INR 1.1   Hepatitis Panel No results for input(s): HEPBSAG, HCVAB, HEPAIGM, HEPBIGM in the last 72 hours.  CT Angio Abd/Pel w/ and/or w/o  Result Date: 04/21/2021 CLINICAL DATA:  GI bleed, lower EXAM: CTA ABDOMEN AND PELVIS WITHOUT AND WITH CONTRAST TECHNIQUE: Multidetector CT imaging of the abdomen and pelvis was performed using the standard protocol during bolus administration of intravenous contrast. Multiplanar reconstructed images and MIPs were obtained and reviewed to evaluate  the vascular anatomy. CONTRAST:  170mL OMNIPAQUE IOHEXOL 350 MG/ML SOLN COMPARISON:  None. FINDINGS: VASCULAR Aorta: Aortic atherosclerosis.  No aneurysm or dissection. Celiac: Patent without evidence of aneurysm, dissection, vasculitis or significant stenosis. SMA: Patent without evidence of aneurysm, dissection, vasculitis or significant stenosis. Renals: Patent. Calcified plaque at the origin of both renal arteries. No dissection. IMA: Patent without evidence of aneurysm, dissection, vasculitis or significant stenosis. Inflow: Calcifications.  No aneurysm or dissection. Proximal Outflow: Bilateral common femoral and visualized portions of the superficial and profunda femoral  arteries are patent without evidence of aneurysm, dissection, vasculitis or significant stenosis. Veins: No obvious venous abnormality within the limitations of this arterial phase study. Review of the MIP images confirms the above findings. NON-VASCULAR Lower chest: No acute abnormality. Hepatobiliary: Scattered hypodensities in the liver, the largest in the posterior right hepatic lobe measuring 1.4 cm, likely cysts. Gallbladder unremarkable. Pancreas: No focal abnormality or ductal dilatation. Spleen: No focal abnormality.  Normal size. Adrenals/Urinary Tract: 3.5 cm midpole right renal cysts. No hydronephrosis. Adrenal glands and urinary bladder unremarkable. Stomach/Bowel: No contrast extravasation to localize GI bleed. No bowel obstruction. Lymphatic: No adenopathy Reproductive: No visible focal abnormality. Other: No free fluid or free air. Musculoskeletal: No acute bony abnormality IMPRESSION: VASCULAR No contrast extravasation seen within the colon or elsewhere in the bowel to localize GI bleed. Aortic atherosclerosis NON-VASCULAR No acute findings in the abdomen or pelvis. Electronically Signed   By: Rolm Baptise M.D.   On: 04/21/2021 17:22       Principal Problem:   GI bleed Active Problems:   Lower GI bleed     LOS: 3 days   Tye Savoy ,NP 04/23/2021, 9:45 AM

## 2021-04-23 NOTE — Plan of Care (Signed)

## 2021-04-30 ENCOUNTER — Ambulatory Visit (INDEPENDENT_AMBULATORY_CARE_PROVIDER_SITE_OTHER): Payer: PPO | Admitting: Family Medicine

## 2021-04-30 VITALS — BP 142/70 | HR 85 | Temp 98.3°F | Wt 192.0 lb

## 2021-04-30 DIAGNOSIS — K922 Gastrointestinal hemorrhage, unspecified: Secondary | ICD-10-CM

## 2021-04-30 DIAGNOSIS — D696 Thrombocytopenia, unspecified: Secondary | ICD-10-CM | POA: Diagnosis not present

## 2021-04-30 DIAGNOSIS — D500 Iron deficiency anemia secondary to blood loss (chronic): Secondary | ICD-10-CM | POA: Diagnosis not present

## 2021-04-30 NOTE — Progress Notes (Signed)
Established Patient Office Visit  Subjective:  Patient ID: Donald Pope, male    DOB: [birth year]  Age: 75 y.o. MRN: [id number]  CC:  Chief Complaint  Patient presents with   Hospitalization Follow-up    HPI Donald Pope presents for hospital follow-up from presumed diverticular bleed.  He has history of lumbar stenosis has had some recent increased back pain.  He has follow-up with his neurosurgeon in January for that.  He apparently presented with about a 5-day history of some bloating and explosive diarrhea with maroon-colored stool.  He was admitted on 9 December and discharged on the 12th.  He had had colonoscopy 2021 which showed some internal hemorrhoids and precancerous polyps.  Denies any recent fever, chills, antibiotics, abdominal pain, nausea, vomiting.  He had been taking baby aspirin per cardiology.  No recent nonsteroidals.  Hemoglobin was 7.6 at discharge.  Patient did not receive any transfusions.  CT angiogram abdomen showed no contrast extravasation within the colon to localize GI bleed.  No other acute findings.  Incidental note of scattered hypodensities in the liver felt to be cysts.  Patient has chronic low platelets and these are essentially unchanged over several years.  Patient states he feels well overall except for his back pain.  No dizziness.  Good appetite.  Past Medical History:  Diagnosis Date   Allergy    Anxiety    Back pain    Benign prostatic hypertrophy    possible   Bilateral leg cramps    CAD, NATIVE VESSEL 10/23/2009   Chest pain    DEPRESSION 09/13/2008   Depression    False positive stress test    history of, suggestive of inferior ischemia   GERD 09/13/2008   HYPERLIPIDEMIA 09/13/2008   Meniere's disease    OBSTRUCTIVE SLEEP APNEA 10/23/2009   cpap   RBBB 10/26/2009   [place]   Sciatic nerve pain    Sleep apnea    Cpap   SOB (shortness of breath)    Spinal stenosis of lumbar region 08/18/2012   THROMBOCYTOPENIA 09/13/2008    Past  Surgical History:  Procedure Laterality Date   CARDIAC CATHETERIZATION     10 yrs ago   COLONOSCOPY  06/27/2016   MAXIMUM ACCESS (MAS)POSTERIOR LUMBAR INTERBODY FUSION (PLIF) 1 LEVEL N/A 05/27/2013   Procedure: FOR MAXIMUM ACCESS (MAS) POSTERIOR LUMBAR INTERBODY FUSION (PLIF) 1 LEVEL ,LUMBAR FOUR-FIVE;  Surgeon: Eustace Goetzinger, MD;  Location: [facility];  Service: Neurosurgery;  Laterality: N/A;  FOR MAXIMUM ACCESS (MAS) POSTERIOR LUMBAR INTERBODY FUSION (PLIF) 1 LEVEL ,LUMBAR FOUR-FIVE   NASAL POLYP EXCISION     TONSILLECTOMY      Family History  Problem Relation Age of Onset   Heart disease Mother 40       CHF   Heart disease Father    COPD Father    Colon polyps Father    Alcoholism Father    Hypertension Sister    Hyperlipidemia Sister    Stomach cancer Neg Hx    Colon cancer Neg Hx    Esophageal cancer Neg Hx    Ulcerative colitis Neg Hx     Social History   Socioeconomic History   Marital status: Married    Spouse name: Ronav Furney   Number of children: 1   Years of education: 16    Highest education level: Bachelor's degree (e.g., BA, AB, BS)  Occupational History   Occupation: retired  Tobacco Use   Smoking status: Never  Smokeless tobacco: Never  Vaping Use   Vaping Use: Never used  Substance and Sexual Activity   Alcohol use: Yes    Alcohol/week: 1.0 standard drink    Types: 1 Glasses of wine per week    Comment: 4-5 times a week   Drug use: No   Sexual activity: Not Currently  Other Topics Concern   Not on file  Social History Narrative   Lives with wife in [place] home. Son lives in [place], [place], 1 grandson.    Animal-lover, especially dogs.   Exercises routinely at [facility], enjoys yoga   Social Determinants of Health   Financial Resource Strain: Low Risk    Difficulty of Paying Living Expenses: Not hard at all  Food Insecurity: No Food Insecurity   Worried About Charity fundraiser in the Last Year: Never true   Arboriculturist in the Last  Year: Never true  Transportation Needs: No Transportation Needs   Lack of Transportation (Medical): No   Lack of Transportation (Non-Medical): No  Physical Activity: Inactive   Days of Exercise per Week: 0 days   Minutes of Exercise per Session: 40 min  Stress: No Stress Concern Present   Feeling of Stress : Only a little  Social Connections: Engineer, building services of Communication with Friends and Family: Three times a week   Frequency of Social Gatherings with Friends and Family: Twice a week   Attends Religious Services: More than 4 times per year   Active Member of [employer] or Organizations: Yes   Attends Music therapist: More than 4 times per year   Marital Status: Married  Human resources officer Violence: Not At Risk   Fear of Current or Ex-Partner: No   Emotionally Abused: No   Physically Abused: No   Sexually Abused: No    Outpatient Medications Prior to Visit  Medication Sig Dispense Refill   amitriptyline (ELAVIL) 10 MG tablet TAKE 1 TABLET BY MOUTH EVERYDAY AT BEDTIME (Patient taking differently: Take 10 mg by mouth at bedtime.) 90 tablet 3   cholecalciferol (VITAMIN D) 1000 UNITS tablet Take 1,000 Units by mouth daily.      citalopram (CELEXA) 10 MG tablet Take 1 tablet (10 mg total) by mouth daily. 90 tablet 3   Coenzyme Q10 (CO Q 10 PO) Take 1 tablet by mouth daily.     fluticasone (FLONASE) 50 MCG/ACT nasal spray Place 2 sprays into both nostrils every other day.     gabapentin (NEURONTIN) 600 MG tablet Take 2 tablets (1,200 mg total) by mouth 2 (two) times daily. 120 tablet 2   melatonin 5 MG TABS Take 5 mg by mouth at bedtime.     methocarbamol (ROBAXIN) 500 MG tablet Take 2 tablets (1,000 mg total) by mouth 3 (three) times daily. 90 tablet 1   omeprazole (PRILOSEC) 20 MG capsule Take 20 mg by mouth every other day.     rosuvastatin (CRESTOR) 5 MG tablet Take 5 mg by mouth daily.     Vitamin D, Ergocalciferol, (DRISDOL) 1.25 MG (50000 UNIT) CAPS  capsule Take 1 capsule (50,000 Units total) by mouth every 7 (seven) days. 8 capsule 0   No facility-administered medications prior to visit.    Allergies  Allergen Reactions   Adhesive [Tape] Rash   Codeine Sulfate Other (See Comments)    urination problems   Latex Rash    ROS Review of Systems  Constitutional:  Negative for appetite change, chills, fever and unexpected weight  change.  Respiratory:  Negative for cough and shortness of breath.   Gastrointestinal:  Negative for abdominal pain, blood in stool, constipation, nausea and vomiting.  Neurological:  Negative for dizziness, syncope and light-headedness.     Objective:    Physical Exam Vitals reviewed.  Constitutional:      Appearance: Normal appearance.  Eyes:     Comments: Incidental note of small polypoid lesion right lower eyelid internally.  Cardiovascular:     Rate and Rhythm: Normal rate and regular rhythm.  Pulmonary:     Effort: Pulmonary effort is normal.     Breath sounds: Normal breath sounds.  Abdominal:     Palpations: Abdomen is soft. There is no mass.     Tenderness: There is no abdominal tenderness. There is no guarding or rebound.  Musculoskeletal:     Right lower leg: No edema.     Left lower leg: No edema.  Neurological:     Mental Status: He is alert.    BP (!) 142/70 (BP Location: Left Arm, Patient Position: Sitting, Cuff Size: Normal)    Pulse 85    Temp 98.3 F (36.8 C) (Oral)    Wt 192 lb (87.1 kg)    SpO2 99%    BMI 30.07 kg/m  Wt Readings from Last 3 Encounters:  04/30/21 192 lb (87.1 kg)  04/20/21 190 lb (86.2 kg)  04/16/21 193 lb 4.8 oz (87.7 kg)     There are no preventive care reminders to display for this patient.  There are no preventive care reminders to display for this patient.  Lab Results  Component Value Date   TSH 2.510 03/09/2020   Lab Results  Component Value Date   WBC 8.4 04/23/2021   HGB 7.6 (L) 04/23/2021   HCT 22.6 (L) 04/23/2021   MCV 93.8  04/23/2021   PLT 105 (L) 04/23/2021   Lab Results  Component Value Date   NA 138 04/23/2021   K 3.6 04/23/2021   CO2 24 04/23/2021   GLUCOSE 97 04/23/2021   BUN 12 04/23/2021   CREATININE 1.09 04/23/2021   BILITOT 0.7 04/23/2021   ALKPHOS 33 (L) 04/23/2021   AST 17 04/23/2021   ALT 13 04/23/2021   PROT 5.5 (L) 04/23/2021   ALBUMIN 3.4 (L) 04/23/2021   CALCIUM 8.4 (L) 04/23/2021   ANIONGAP 8 04/23/2021   GFR 77.98 06/16/2017   Lab Results  Component Value Date   CHOL 131 06/21/2020   Lab Results  Component Value Date   HDL 39 (L) 06/21/2020   Lab Results  Component Value Date   LDLCALC 72 06/21/2020   Lab Results  Component Value Date   TRIG 109 06/21/2020   Lab Results  Component Value Date   CHOLHDL 3.4 06/21/2020   Lab Results  Component Value Date   HGBA1C 5.8 (H) 06/21/2020      Assessment & Plan:   #1 recent lower GI bleed presumably diverticular.  No prior history of diverticular bleed.  No evidence for any further bleeding since discharge.  -Recheck CBC and CMP -He has pending follow-up with GI -Discussed iron rich diet -Continue to avoid aspirin now unless further instructed by cardiology -We have recommended 75-month follow-up -Patient knows to follow-up immediately for any recurrent lower GI bleed symptoms, dizziness, or other concerns  #2 polypoid lesion right lower conjunctiva internally.  We recommend he follow-up with his ophthalmologist regarding this  #3 chronic stable thrombocytopenia.   No orders of the defined types were placed  in this encounter.   Follow-up: Return in about 2 months (around 07/01/2021).    Carolann Littler, MD

## 2021-05-01 LAB — CBC WITH DIFFERENTIAL/PLATELET
Basophils Absolute: 0.1 10*3/uL (ref 0.0–0.1)
Basophils Relative: 0.8 % (ref 0.0–3.0)
Eosinophils Absolute: 0.1 10*3/uL (ref 0.0–0.7)
Eosinophils Relative: 1.1 % (ref 0.0–5.0)
HCT: 26.1 % — ABNORMAL LOW (ref 39.0–52.0)
Hemoglobin: 8.8 g/dL — ABNORMAL LOW (ref 13.0–17.0)
Lymphocytes Relative: 29.6 % (ref 12.0–46.0)
Lymphs Abs: 1.8 10*3/uL (ref 0.7–4.0)
MCHC: 33.7 g/dL (ref 30.0–36.0)
MCV: 94.3 fl (ref 78.0–100.0)
Monocytes Absolute: 0.6 10*3/uL (ref 0.1–1.0)
Monocytes Relative: 9.5 % (ref 3.0–12.0)
Neutro Abs: 3.6 10*3/uL (ref 1.4–7.7)
Neutrophils Relative %: 59 % (ref 43.0–77.0)
Platelets: 159 10*3/uL (ref 150.0–400.0)
RBC: 2.76 Mil/uL — ABNORMAL LOW (ref 4.22–5.81)
RDW: 14.2 % (ref 11.5–15.5)
WBC: 6.1 10*3/uL (ref 4.0–10.5)

## 2021-05-01 LAB — COMPREHENSIVE METABOLIC PANEL
ALT: 14 U/L (ref 0–53)
AST: 22 U/L (ref 0–37)
Albumin: 4.3 g/dL (ref 3.5–5.2)
Alkaline Phosphatase: 56 U/L (ref 39–117)
BUN: 17 mg/dL (ref 6–23)
CO2: 27 mEq/L (ref 19–32)
Calcium: 9.1 mg/dL (ref 8.4–10.5)
Chloride: 106 mEq/L (ref 96–112)
Creatinine, Ser: 1.11 mg/dL (ref 0.40–1.50)
GFR: 64.61 mL/min (ref >60.00)
Glucose, Bld: 94 mg/dL (ref 70–99)
Potassium: 4.3 mEq/L (ref 3.5–5.1)
Sodium: 140 mEq/L (ref 135–145)
Total Bilirubin: 0.3 mg/dL (ref 0.2–1.2)
Total Protein: 7 g/dL (ref 6.0–8.3)

## 2021-05-10 DIAGNOSIS — H0012 Chalazion right lower eyelid: Secondary | ICD-10-CM | POA: Diagnosis not present

## 2021-05-11 ENCOUNTER — Encounter: Payer: Self-pay | Admitting: Family Medicine

## 2021-05-16 ENCOUNTER — Other Ambulatory Visit (INDEPENDENT_AMBULATORY_CARE_PROVIDER_SITE_OTHER): Payer: PPO

## 2021-05-16 ENCOUNTER — Encounter: Payer: Self-pay | Admitting: Nurse Practitioner

## 2021-05-16 ENCOUNTER — Ambulatory Visit: Payer: PPO | Admitting: Nurse Practitioner

## 2021-05-16 VITALS — BP 112/58 | HR 87 | Ht 67.0 in | Wt 192.0 lb

## 2021-05-16 DIAGNOSIS — K5731 Diverticulosis of large intestine without perforation or abscess with bleeding: Secondary | ICD-10-CM

## 2021-05-16 DIAGNOSIS — D62 Acute posthemorrhagic anemia: Secondary | ICD-10-CM | POA: Diagnosis not present

## 2021-05-16 LAB — CBC
HCT: 30.5 % — ABNORMAL LOW (ref 39.0–52.0)
Hemoglobin: 9.8 g/dL — ABNORMAL LOW (ref 13.0–17.0)
MCHC: 32.3 g/dL (ref 30.0–36.0)
MCV: 90.4 fl (ref 78.0–100.0)
Platelets: 148 10*3/uL — ABNORMAL LOW (ref 150.0–400.0)
RBC: 3.37 Mil/uL — ABNORMAL LOW (ref 4.22–5.81)
RDW: 13.5 % (ref 11.5–15.5)
WBC: 6.5 10*3/uL (ref 4.0–10.5)

## 2021-05-16 NOTE — Patient Instructions (Signed)
If you are age 77 or older, your body mass index should be between 23-30. Your Body mass index is 30.07 kg/m. If this is out of the aforementioned range listed, please consider follow up with your Primary Care Provider. ________________________________________________________  The [facility] providers would like to encourage you to use [facility] to communicate with providers for non-urgent requests or questions.  Due to long hold times on the telephone, sending your provider a message by [facility] may be a faster and more efficient way to get a response.  Please allow 48 business hours for a response.  Please remember that this is for non-urgent requests.  _______________________________________________________  Your provider has requested that you go to the basement level for lab work before leaving today. Press "B" on the elevator. The lab is located at the first door on the left as you exit the elevator.  From a GI stand point, you restart your baby Asprin.   Follow up as needed.  Thank you for entrusting me with your care and choosing [facility].  Tye Savoy, NP-C

## 2021-05-16 NOTE — Progress Notes (Addendum)
ASSESSMENT AND PLAN    # 76 yo male with painless lower GI bleed requiring hospitalization mid December. Presumed  diverticular hemorrhage. Resolved.  --Okay from GI standpoint to resume daily baby ASA  # Acute blood loss anemia. Hgb declined from 14 on admission to 8.8. Asymptomatic at present.  --Follow up CBC today.   # History of colon polyps. Due for 3 year surveilance colonoscopy March 2024.  # Chronic thrombocytopenia of unclear etiology. Normal appearing spleen on recent CTA   HISTORY OF PRESENT ILLNESS    Chief Complaint : Hospital follow-up after GI bleed  Donald Pope is a 76 y.o. male known to Dr. Hilarie Fredrickson with a past medical history of diverticulosis, presumed diverticular hemorrhage, colon polyps, nonobstructive CAD, HLD, OSA. See additional medical history as listed in PMH .   Patient was last seen while hospitalized mid December 2022 with a lower Gi bleed presumed to be a diverticular hemorrhage. CTA negative. Hgb declined from~ 14 on admission to 7 range. Mild spontaneous improvement in hgb to 8.8 by time of discharge. He didn' require a blood transfusion. He was not discharged on iron.  No further bleeding. He feels okay, not significantly tired.   Patient's baby asa was held as discharge. He was also asked to stop Zyrtec, Mg+ and multivitamin. Cardiology previously told him that he should stay on a baby asa but he has a follow up with them in a few weeks and will discuss.    PREVIOUS ENDOSCOPIC EVALUATIONS / PERTINENT STUDIES:   March 2021 colonoscopy --8 mm polyp in the cecum, removed with a cold snare. Resected and retrieved. - 3 mm polyp in the ascending colon, removed with a cold snare. Resected and retrieved. - 5 mm polyp in the descending colon, removed with a cold snare. Resected and retrieved. - Diverticulosis in the sigmoid colon and in the descending colon. - Internal hemorrhoids.   Current Medications, Allergies, Past Medical History, Past  Surgical History, Family History and Social History were reviewed in [employer] record.     Current Outpatient Medications  Medication Sig Dispense Refill   amitriptyline (ELAVIL) 10 MG tablet TAKE 1 TABLET BY MOUTH EVERYDAY AT BEDTIME (Patient taking differently: Take 10 mg by mouth at bedtime.) 90 tablet 3   cholecalciferol (VITAMIN D) 1000 UNITS tablet Take 1,000 Units by mouth daily.      citalopram (CELEXA) 10 MG tablet Take 1 tablet (10 mg total) by mouth daily. 90 tablet 3   Coenzyme Q10 (CO Q 10 PO) Take 1 tablet by mouth daily.     fluticasone (FLONASE) 50 MCG/ACT nasal spray Place 2 sprays into both nostrils every other day.     gabapentin (NEURONTIN) 600 MG tablet Take 2 tablets (1,200 mg total) by mouth 2 (two) times daily. 120 tablet 2   melatonin 5 MG TABS Take 5 mg by mouth at bedtime.     methocarbamol (ROBAXIN) 500 MG tablet Take 2 tablets (1,000 mg total) by mouth 3 (three) times daily. 90 tablet 1   omeprazole (PRILOSEC) 20 MG capsule Take 20 mg by mouth every other day.     rosuvastatin (CRESTOR) 5 MG tablet Take 5 mg by mouth daily.     No current facility-administered medications for this visit.    Review of Systems: No chest pain. No shortness of breath. No urinary complaints.   PHYSICAL EXAM :    Wt Readings from Last 3 Encounters:  05/16/21 192 lb (87.1 kg)  04/30/21 192 lb (87.1 kg)  04/20/21 190 lb (86.2 kg)    BP (!) 112/58    Pulse 87    Ht 5\' 7"  (1.702 m)    Wt 192 lb (87.1 kg)    BMI 30.07 kg/m  Constitutional:  Generally well appearing male in no acute distress. Psychiatric: Pleasant. Normal mood and affect. Behavior is normal. EENT: Pupils normal.  Conjunctivae are normal. No scleral icterus. Neck supple.  Cardiovascular: Normal rate, regular rhythm. No edema Pulmonary/chest: Effort normal and breath sounds normal. No wheezing, rales or rhonchi. Abdominal: Soft, nondistended, nontender. Bowel sounds active throughout.  There are no masses palpable. No hepatomegaly. Neurological: Alert and oriented to person place and time. Skin: Skin is warm and dry. No rashes noted.  Tye Savoy, NP  05/16/2021, 11:16 AM  Cc:  [name], MD

## 2021-05-17 DIAGNOSIS — M5416 Radiculopathy, lumbar region: Secondary | ICD-10-CM | POA: Diagnosis not present

## 2021-05-17 NOTE — Progress Notes (Signed)
Addendum: Reviewed and agree with assessment and management plan. Jereme Loren M, MD  

## 2021-05-21 ENCOUNTER — Other Ambulatory Visit: Payer: Self-pay | Admitting: Physical Medicine & Rehabilitation

## 2021-05-21 ENCOUNTER — Telehealth: Payer: Self-pay | Admitting: Family Medicine

## 2021-05-21 MED ORDER — GABAPENTIN 600 MG PO TABS: 1200.0000 mg | ORAL_TABLET | Freq: Two times a day (BID) | ORAL | 3 refills | Status: DC

## 2021-05-21 NOTE — Telephone Encounter (Signed)
Patient is requesting a refill for gabapentin (NEURONTIN) 600 MG tablet [[id number]]  to be sent to his pharmacy. Patient stated that the original doctor that prescribed it isn't practicing anymore.   Patient is requesting a phone call back at [phone].  Please advise.

## 2021-05-21 NOTE — Telephone Encounter (Signed)
Pt is aware rx has been sent to [facility]

## 2021-05-21 NOTE — Telephone Encounter (Signed)
Rx sent in.  Left a detailed message on verified voice mail informing the patient his medication has been sent to the pharmacy.

## 2021-05-21 NOTE — Telephone Encounter (Signed)
Please advise. Ok to refill? °

## 2021-05-22 DIAGNOSIS — M545 Low back pain, unspecified: Secondary | ICD-10-CM | POA: Diagnosis not present

## 2021-05-22 DIAGNOSIS — M5416 Radiculopathy, lumbar region: Secondary | ICD-10-CM | POA: Diagnosis not present

## 2021-05-31 DIAGNOSIS — M5416 Radiculopathy, lumbar region: Secondary | ICD-10-CM | POA: Diagnosis not present

## 2021-06-05 ENCOUNTER — Ambulatory Visit (INDEPENDENT_AMBULATORY_CARE_PROVIDER_SITE_OTHER): Payer: PPO

## 2021-06-05 VITALS — BP 132/64 | HR 86 | Temp 98.4°F | Ht 67.5 in | Wt 190.0 lb

## 2021-06-05 DIAGNOSIS — Z0000 Encounter for general adult medical examination without abnormal findings: Secondary | ICD-10-CM

## 2021-06-05 NOTE — Patient Instructions (Signed)
Donald Pope , Thank you for taking time to come for your Medicare Wellness Visit. I appreciate your ongoing commitment to your health goals. Please review the following plan we discussed and let me know if I can assist you in the future.   Screening recommendations/referrals: Colonoscopy: completed 07/12/2019, due 07/12/2022 Recommended yearly ophthalmology/optometry visit for glaucoma screening and checkup Recommended yearly dental visit for hygiene and checkup  Vaccinations: Influenza vaccine: completed 02/05/2021 Pneumococcal vaccine: completed 01/28/2014 Tdap vaccine: completed 05/28/2018, due 05/28/2028 Shingles vaccine: completed   Covid-19: 02/05/2021, 09/01/2020, 01/31/2020, 06/23/2019, 06/02/2019  Advanced directives: Please bring a copy of your POA (Power of Attorney) and/or Living Will to your next appointment.   Conditions/risks identified: none  Next appointment: Follow up in one year for your annual wellness visit.   Preventive Care 76 Years and Older, Male Preventive care refers to lifestyle choices and visits with your health care provider that can promote health and wellness. What does preventive care include? A yearly physical exam. This is also called an annual well check. Dental exams once or twice a year. Routine eye exams. Ask your health care provider how often you should have your eyes checked. Personal lifestyle choices, including: Daily care of your teeth and gums. Regular physical activity. Eating a healthy diet. Avoiding tobacco and drug use. Limiting alcohol use. Practicing safe sex. Taking low doses of aspirin every day. Taking vitamin and mineral supplements as recommended by your health care provider. What happens during an annual well check? The services and screenings done by your health care provider during your annual well check will depend on your age, overall health, lifestyle risk factors, and family history of disease. Counseling  Your health care  provider may ask you questions about your: Alcohol use. Tobacco use. Drug use. Emotional well-being. Home and relationship well-being. Sexual activity. Eating habits. History of falls. Memory and ability to understand (cognition). Work and work Statistician. Screening  You may have the following tests or measurements: Height, weight, and BMI. Blood pressure. Lipid and cholesterol levels. These may be checked every 5 years, or more frequently if you are over 17 years old. Skin check. Lung cancer screening. You may have this screening every year starting at age 57 if you have a 30-pack-year history of smoking and currently smoke or have quit within the past 15 years. Fecal occult blood test (FOBT) of the stool. You may have this test every year starting at age 85. Flexible sigmoidoscopy or colonoscopy. You may have a sigmoidoscopy every 5 years or a colonoscopy every 10 years starting at age 58. Prostate cancer screening. Recommendations will vary depending on your family history and other risks. Hepatitis C blood test. Hepatitis B blood test. Sexually transmitted disease (STD) testing. Diabetes screening. This is done by checking your blood sugar (glucose) after you have not eaten for a while (fasting). You may have this done every 1-3 years. Abdominal aortic aneurysm (AAA) screening. You may need this if you are a current or former smoker. Osteoporosis. You may be screened starting at age 35 if you are at high risk. Talk with your health care provider about your test results, treatment options, and if necessary, the need for more tests. Vaccines  Your health care provider may recommend certain vaccines, such as: Influenza vaccine. This is recommended every year. Tetanus, diphtheria, and acellular pertussis (Tdap, Td) vaccine. You may need a Td booster every 10 years. Zoster vaccine. You may need this after age 51. Pneumococcal 13-valent conjugate (PCV13) vaccine. One  dose is  recommended after age 52. Pneumococcal polysaccharide (PPSV23) vaccine. One dose is recommended after age 2. Talk to your health care provider about which screenings and vaccines you need and how often you need them. This information is not intended to replace advice given to you by your health care provider. Make sure you discuss any questions you have with your health care provider. Document Released: 05/26/2015 Document Revised: 01/17/2016 Document Reviewed: 02/28/2015 Elsevier Interactive Patient Education  2017 [place] Prevention in the Home Falls can cause injuries. They can happen to people of all ages. There are many things you can do to make your home safe and to help prevent falls. What can I do on the outside of my home? Regularly fix the edges of walkways and driveways and fix any cracks. Remove anything that might make you trip as you walk through a door, such as a raised step or threshold. Trim any bushes or trees on the path to your home. Use bright outdoor lighting. Clear any walking paths of anything that might make someone trip, such as rocks or tools. Regularly check to see if handrails are loose or broken. Make sure that both sides of any steps have handrails. Any raised decks and porches should have guardrails on the edges. Have any leaves, snow, or ice cleared regularly. Use sand or salt on walking paths during winter. Clean up any spills in your garage right away. This includes oil or grease spills. What can I do in the bathroom? Use night lights. Install grab bars by the toilet and in the tub and shower. Do not use towel bars as grab bars. Use non-skid mats or decals in the tub or shower. If you need to sit down in the shower, use a plastic, non-slip stool. Keep the floor dry. Clean up any water that spills on the floor as soon as it happens. Remove soap buildup in the tub or shower regularly. Attach bath mats securely with double-sided non-slip rug  tape. Do not have throw rugs and other things on the floor that can make you trip. What can I do in the bedroom? Use night lights. Make sure that you have a light by your bed that is easy to reach. Do not use any sheets or blankets that are too big for your bed. They should not hang down onto the floor. Have a firm chair that has side arms. You can use this for support while you get dressed. Do not have throw rugs and other things on the floor that can make you trip. What can I do in the kitchen? Clean up any spills right away. Avoid walking on wet floors. Keep items that you use a lot in easy-to-reach places. If you need to reach something above you, use a strong step stool that has a grab bar. Keep electrical cords out of the way. Do not use floor polish or wax that makes floors slippery. If you must use wax, use non-skid floor wax. Do not have throw rugs and other things on the floor that can make you trip. What can I do with my stairs? Do not leave any items on the stairs. Make sure that there are handrails on both sides of the stairs and use them. Fix handrails that are broken or loose. Make sure that handrails are as long as the stairways. Check any carpeting to make sure that it is firmly attached to the stairs. Fix any carpet that is loose or worn.  Avoid having throw rugs at the top or bottom of the stairs. If you do have throw rugs, attach them to the floor with carpet tape. Make sure that you have a light switch at the top of the stairs and the bottom of the stairs. If you do not have them, ask someone to add them for you. What else can I do to help prevent falls? Wear shoes that: Do not have high heels. Have rubber bottoms. Are comfortable and fit you well. Are closed at the toe. Do not wear sandals. If you use a stepladder: Make sure that it is fully opened. Do not climb a closed stepladder. Make sure that both sides of the stepladder are locked into place. Ask someone to  hold it for you, if possible. Clearly mark and make sure that you can see: Any grab bars or handrails. First and last steps. Where the edge of each step is. Use tools that help you move around (mobility aids) if they are needed. These include: Canes. Walkers. Scooters. Crutches. Turn on the lights when you go into a dark area. Replace any light bulbs as soon as they burn out. Set up your furniture so you have a clear path. Avoid moving your furniture around. If any of your floors are uneven, fix them. If there are any pets around you, be aware of where they are. Review your medicines with your doctor. Some medicines can make you feel dizzy. This can increase your chance of falling. Ask your doctor what other things that you can do to help prevent falls. This information is not intended to replace advice given to you by your health care provider. Make sure you discuss any questions you have with your health care provider. Document Released: 02/23/2009 Document Revised: 10/05/2015 Document Reviewed: 06/03/2014 Elsevier Interactive Patient Education  2017 [employer].

## 2021-06-05 NOTE — Progress Notes (Signed)
This visit occurred during the SARS-CoV-2 public health emergency.  Safety protocols were in place, including screening questions prior to the visit, additional usage of staff PPE, and extensive cleaning of exam room while observing appropriate contact time as indicated for disinfecting solutions.  Subjective:   Donald Pope is a 76 y.o. male who presents for Medicare Annual/Subsequent preventive examination.  Review of Systems     Cardiac Risk Factors include: advanced age (>40men, >31 women);dyslipidemia;hypertension;male gender     Objective:    Today's Vitals   06/05/21 1449 06/05/21 1454  BP: 132/64   Pulse: 86   Temp: 98.4 F (36.9 C)   TempSrc: Oral   SpO2: 97%   Weight: 190 lb (86.2 kg)   Height: 5' 7.5" (1.715 m)   PainSc:  3    Body mass index is 29.32 kg/m.  Advanced Directives 06/05/2021 04/20/2021 02/05/2021 06/28/2020 06/07/2019 06/01/2018 05/14/2017  Does Patient Have a Medical Advance Directive? Yes Yes Yes Yes Yes Yes Yes  Type of Paramedic of [place];Living will [place];Living will [place];Living will [place];Living will [place];Living will [place];Living will -  Does patient want to make changes to medical advance directive? - - No - Patient declined No - Patient declined No - Patient declined No - Patient declined -  Copy of [place] in Chart? No - copy requested - Yes - validated most recent copy scanned in chart (See row information) No - copy requested No - copy requested No - copy requested -    Current Medications (verified) Outpatient Encounter Medications as of 06/05/2021  Medication Sig   amitriptyline (ELAVIL) 10 MG tablet TAKE 1 TABLET BY MOUTH EVERYDAY AT BEDTIME (Patient taking differently: Take 10 mg by mouth at bedtime.)   citalopram (CELEXA) 10 MG tablet Take 1 tablet (10 mg total) by mouth daily.    Coenzyme Q10 (CO Q 10 PO) Take 1 tablet by mouth daily.   fluticasone (FLONASE) 50 MCG/ACT nasal spray Place 2 sprays into both nostrils every other day.   gabapentin (NEURONTIN) 600 MG tablet Take 2 tablets (1,200 mg total) by mouth 2 (two) times daily.   melatonin 5 MG TABS Take 5 mg by mouth at bedtime.   methocarbamol (ROBAXIN) 500 MG tablet Take 2 tablets (1,000 mg total) by mouth 3 (three) times daily.   omeprazole (PRILOSEC) 20 MG capsule Take 20 mg by mouth every other day.   rosuvastatin (CRESTOR) 5 MG tablet Take 5 mg by mouth daily.   cholecalciferol (VITAMIN D) 1000 UNITS tablet Take 1,000 Units by mouth daily.    No facility-administered encounter medications on file as of 06/05/2021.    Allergies (verified) Adhesive [tape], Codeine sulfate, and Latex   History: Past Medical History:  Diagnosis Date   Allergy    Anxiety    Back pain    Benign prostatic hypertrophy    possible   Bilateral leg cramps    CAD, NATIVE VESSEL 10/23/2009   Chest pain    DEPRESSION 09/13/2008   Depression    False positive stress test    history of, suggestive of inferior ischemia   GERD 09/13/2008   History of GI bleed    HYPERLIPIDEMIA 09/13/2008   Meniere's disease    OBSTRUCTIVE SLEEP APNEA 10/23/2009   cpap   RBBB 10/26/2009   [place]   Sciatic nerve pain    Sleep apnea  Cpap   SOB (shortness of breath)    Spinal stenosis of lumbar region 08/18/2012   THROMBOCYTOPENIA 09/13/2008   Past Surgical History:  Procedure Laterality Date   CARDIAC CATHETERIZATION     10 yrs ago   COLONOSCOPY  06/27/2016   MAXIMUM ACCESS (MAS)POSTERIOR LUMBAR INTERBODY FUSION (PLIF) 1 LEVEL N/A 05/27/2013   Procedure: FOR MAXIMUM ACCESS (MAS) POSTERIOR LUMBAR INTERBODY FUSION (PLIF) 1 LEVEL ,LUMBAR FOUR-FIVE;  Surgeon: Eustace Remund, MD;  Location: [facility];  Service: Neurosurgery;  Laterality: N/A;  FOR MAXIMUM ACCESS (MAS) POSTERIOR LUMBAR INTERBODY FUSION (PLIF) 1 LEVEL ,LUMBAR FOUR-FIVE    NASAL POLYP EXCISION     TONSILLECTOMY     Family History  Problem Relation Age of Onset   Heart disease Mother 37       CHF   Heart disease Father    COPD Father    Colon polyps Father    Alcoholism Father    Hypertension Sister    Hyperlipidemia Sister    Stomach cancer Neg Hx    Colon cancer Neg Hx    Esophageal cancer Neg Hx    Ulcerative colitis Neg Hx    Social History   Socioeconomic History   Marital status: Married    Spouse name: Sourish Allender   Number of children: 1   Years of education: 16    Highest education level: Bachelor's degree (e.g., BA, AB, BS)  Occupational History   Occupation: retired  Tobacco Use   Smoking status: Never   Smokeless tobacco: Never  Vaping Use   Vaping Use: Never used  Substance and Sexual Activity   Alcohol use: Yes    Alcohol/week: 1.0 standard drink    Types: 1 Glasses of wine per week    Comment: 4-5 times a week   Drug use: No   Sexual activity: Not Currently  Other Topics Concern   Not on file  Social History Narrative   Lives with wife in [place] home. Son lives in [place], [place], 1 grandson.    Animal-lover, especially dogs.   Exercises routinely at [facility], enjoys yoga   Social Determinants of Health   Financial Resource Strain: Low Risk    Difficulty of Paying Living Expenses: Not hard at all  Food Insecurity: No Food Insecurity   Worried About Charity fundraiser in the Last Year: Never true   Arboriculturist in the Last Year: Never true  Transportation Needs: No Transportation Needs   Lack of Transportation (Medical): No   Lack of Transportation (Non-Medical): No  Physical Activity: Inactive   Days of Exercise per Week: 0 days   Minutes of Exercise per Session: 0 min  Stress: No Stress Concern Present   Feeling of Stress : Only a little  Social Connections: Engineer, building services of Communication with Friends and Family: Three times a week   Frequency of Social Gatherings with Friends and Family:  Twice a week   Attends Religious Services: More than 4 times per year   Active Member of [employer] or Organizations: Yes   Attends Music therapist: More than 4 times per year   Marital Status: Married    Tobacco Counseling Counseling given: Not Answered   Clinical Intake:  Pre-visit preparation completed: Yes  Pain : 0-10 Pain Score: 3  Pain Type: Chronic pain Pain Location: Back (down legs) Pain Orientation: Lower Pain Descriptors / Indicators: Aching Pain Onset: More than a month ago Pain Frequency: Constant  Nutritional Status: BMI 25 -29 Overweight Nutritional Risks: Nausea/ vomitting/ diarrhea (nausea last night) Diabetes: No  How often do you need to have someone help you when you read instructions, pamphlets, or other written materials from your doctor or pharmacy?: 1 - Never What is the last grade level you completed in school?: college  Diabetic? no  Interpreter Needed?: No  Information entered by :: NAllen LPN   Activities of Daily Living In your present state of health, do you have any difficulty performing the following activities: 06/05/2021 06/04/2021  Hearing? Tempie Donning  Comment hearing aides -  Vision? N N  Difficulty concentrating or making decisions? N N  Walking or climbing stairs? Y Y  Dressing or bathing? N N  Doing errands, shopping? N N  Preparing Food and eating ? N N  Using the Toilet? N N  In the past six months, have you accidently leaked urine? N N  Do you have problems with loss of bowel control? N N  Managing your Medications? N N  Managing your Finances? N N  Housekeeping or managing your Housekeeping? N N  Some recent data might be hidden    Patient Care Team: [name], MD as PCP - General Belva Crome, MD as PCP - Cardiology (Cardiology) Marica Otter, OD (Optometry) [place], [place], [facility] (Pharmacist)  Indicate any recent Medical Services you may have received from other than [facility] providers in the  past year (date may be approximate).     Assessment:   This is a routine wellness examination for Ziggy.  Hearing/Vision screen Vision Screening - Comments:: Regular eye exams, Dr. Marica Otter  Dietary issues and exercise activities discussed: Current Exercise Habits: The patient does not participate in regular exercise at present   Goals Addressed             This Visit's Progress    Patient Stated       06/05/2021, get surgery to get back to normal       Depression Screen PHQ 2/9 Scores 06/05/2021 04/30/2021 12/28/2020 08/07/2020 06/28/2020 06/26/2020 03/09/2020  PHQ - 2 Score 0 2 0 0 0 0 1  PHQ- 9 Score - 5 - - 0 1 4    Fall Risk Fall Risk  06/05/2021 06/04/2021 04/30/2021 04/05/2021 12/28/2020  Falls in the past year? 0 0 0 0 0  Number falls in past yr: - 0 - - -  Injury with Fall? - 0 - - -  Risk for fall due to : Medication side effect - - - -  Risk for fall due to: Comment - - - - -  Follow up Falls evaluation completed;Education provided;Falls prevention discussed - - - -    FALL RISK PREVENTION PERTAINING TO THE HOME:  Any stairs in or around the home? No  If so, are there any without handrails?  N/a Home free of loose throw rugs in walkways, pet beds, electrical cords, etc? Yes  Adequate lighting in your home to reduce risk of falls? Yes   ASSISTIVE DEVICES UTILIZED TO PREVENT FALLS:  Life alert? No  Use of a cane, walker or w/c? No  Grab bars in the bathroom? No  Shower chair or bench in shower? Yes  Elevated toilet seat or a handicapped toilet? No   TIMED UP AND GO:  Was the test performed? No .     Gait steady and fast without use of assistive device  Cognitive Function: MMSE - Mini Mental State Exam 05/14/2017  Not completed: (No Data)     6CIT Screen 06/05/2021 06/07/2019  What Year? 0 points -  What month? 0 points 0 points  What time? 0 points 0 points  Count back from 20 0 points 0 points  Months in reverse 0 points 0 points  Repeat  phrase 2 points 0 points  Total Score 2 -    Immunizations Immunization History  Administered Date(s) Administered   Influenza Split 03/18/2012, 01/12/2020   Influenza Whole 03/13/2010   Influenza, High Dose Seasonal PF 04/05/2013, 03/08/2015, 03/13/2017, 01/16/2018, 03/04/2019, 02/05/2021   Influenza,inj,Quad PF,6+ Mos 01/28/2014   PFIZER Comirnaty(Gray Top)Covid-19 Tri-Sucrose Vaccine 09/01/2020   PFIZER(Purple Top)SARS-COV-2 Vaccination 06/02/2019, 06/23/2019, 01/31/2020   Pneumococcal Conjugate-13 01/28/2014   Pneumococcal Polysaccharide-23 11/19/2010   Td 09/20/2009   Tdap 01/11/2013   Zoster Recombinat (Shingrix) 11/03/2018, 01/09/2019   Zoster, Live 01/01/2013    TDAP status: Up to date  Flu Vaccine status: Up to date  Pneumococcal vaccine status: Up to date  Covid-19 vaccine status: Completed vaccines  Qualifies for Shingles Vaccine? Yes   Zostavax completed Yes   Shingrix Completed?: Yes  Screening Tests Health Maintenance  Topic Date Due   Hepatitis C Screening  07/19/2043 (Originally 01/22/1964)   COLONOSCOPY (Pts 45-64yrs Insurance coverage will need to be confirmed)  07/12/2022   TETANUS/TDAP  05/28/2028   Pneumonia Vaccine 2+ Years old  Completed   INFLUENZA VACCINE  Completed   COVID-19 Vaccine  Completed   Zoster Vaccines- Shingrix  Completed   HPV VACCINES  Aged Out    Health Maintenance  There are no preventive care reminders to display for this patient.  Colorectal cancer screening: Type of screening: Colonoscopy. Completed 07/12/2019. Repeat every 3 years  Lung Cancer Screening: (Low Dose CT Chest recommended if Age 58-80 years, 30 pack-year currently smoking OR have quit w/in 15years.) does not qualify.   Lung Cancer Screening Referral: no  Additional Screening:  Hepatitis C Screening: does qualify;   Vision Screening: Recommended annual ophthalmology exams for early detection of glaucoma and other disorders of the eye. Is the patient up  to date with their annual eye exam?  Yes  Who is the provider or what is the name of the office in which the patient attends annual eye exams? Dr. Marica Otter If pt is not established with a provider, would they like to be referred to a provider to establish care? No .   Dental Screening: Recommended annual dental exams for proper oral hygiene  Community Resource Referral / Chronic Care Management: CRR required this visit?  No   CCM required this visit?  No      Plan:     I have personally reviewed and noted the following in the patients chart:   Medical and social history Use of alcohol, tobacco or illicit drugs  Current medications and supplements including opioid prescriptions. Patient is not currently taking opioid prescriptions. Functional ability and status Nutritional status Physical activity Advanced directives List of other physicians Hospitalizations, surgeries, and ER visits in previous 12 months Vitals Screenings to include cognitive, depression, and falls Referrals and appointments  In addition, I have reviewed and discussed with patient certain preventive protocols, quality metrics, and best practice recommendations. A written personalized care plan for preventive services as well as general preventive health recommendations were provided to patient.     Kellie Simmering, LPN   3/49/1791   Nurse Notes: none

## 2021-06-12 ENCOUNTER — Other Ambulatory Visit: Payer: Self-pay | Admitting: Student

## 2021-06-12 DIAGNOSIS — M5416 Radiculopathy, lumbar region: Secondary | ICD-10-CM

## 2021-06-14 DIAGNOSIS — H903 Sensorineural hearing loss, bilateral: Secondary | ICD-10-CM | POA: Diagnosis not present

## 2021-06-15 DIAGNOSIS — H8190 Unspecified disorder of vestibular function, unspecified ear: Secondary | ICD-10-CM | POA: Diagnosis not present

## 2021-06-15 DIAGNOSIS — H903 Sensorineural hearing loss, bilateral: Secondary | ICD-10-CM | POA: Diagnosis not present

## 2021-06-20 ENCOUNTER — Other Ambulatory Visit: Payer: Self-pay

## 2021-06-20 ENCOUNTER — Ambulatory Visit
Admission: RE | Admit: 2021-06-20 | Discharge: 2021-06-20 | Disposition: A | Discharge status: Home / Self Care | Payer: PPO | Source: Ambulatory Visit | Attending: Student | Admitting: Student

## 2021-06-20 ENCOUNTER — Other Ambulatory Visit: Payer: Self-pay | Admitting: Student

## 2021-06-20 DIAGNOSIS — M5416 Radiculopathy, lumbar region: Secondary | ICD-10-CM

## 2021-06-20 DIAGNOSIS — M4326 Fusion of spine, lumbar region: Secondary | ICD-10-CM | POA: Diagnosis not present

## 2021-06-20 DIAGNOSIS — M48061 Spinal stenosis, lumbar region without neurogenic claudication: Secondary | ICD-10-CM | POA: Diagnosis not present

## 2021-06-20 MED ORDER — IOPAMIDOL (ISOVUE-M 200) INJECTION 41%
1.0000 mL | Freq: Once | INTRAMUSCULAR | Status: AC
  Administered 2021-06-20: 1 mL via EPIDURAL

## 2021-06-20 MED ORDER — METHYLPREDNISOLONE ACETATE 40 MG/ML INJ SUSP (RADIOLOG
80.0000 mg | Freq: Once | INTRAMUSCULAR | Status: AC
  Administered 2021-06-20: 80 mg via EPIDURAL

## 2021-06-20 NOTE — Discharge Instructions (Signed)

## 2021-07-02 ENCOUNTER — Ambulatory Visit: Payer: PPO | Admitting: Family Medicine

## 2021-07-12 DIAGNOSIS — M5416 Radiculopathy, lumbar region: Secondary | ICD-10-CM | POA: Diagnosis not present

## 2021-07-12 DIAGNOSIS — M79605 Pain in left leg: Secondary | ICD-10-CM | POA: Diagnosis not present

## 2021-08-29 DIAGNOSIS — I739 Peripheral vascular disease, unspecified: Secondary | ICD-10-CM | POA: Diagnosis not present

## 2021-08-30 DIAGNOSIS — M5416 Radiculopathy, lumbar region: Secondary | ICD-10-CM | POA: Diagnosis not present

## 2021-08-31 ENCOUNTER — Other Ambulatory Visit: Payer: Self-pay | Admitting: Neurosurgery

## 2021-08-31 DIAGNOSIS — I739 Peripheral vascular disease, unspecified: Secondary | ICD-10-CM

## 2021-09-03 ENCOUNTER — Other Ambulatory Visit: Payer: PPO

## 2021-09-12 ENCOUNTER — Ambulatory Visit
Admission: RE | Admit: 2021-09-12 | Discharge: 2021-09-12 | Disposition: A | Discharge status: Home / Self Care | Payer: PPO | Source: Ambulatory Visit | Attending: Neurosurgery | Admitting: Neurosurgery

## 2021-09-12 DIAGNOSIS — I739 Peripheral vascular disease, unspecified: Secondary | ICD-10-CM

## 2021-09-12 DIAGNOSIS — I70213 Atherosclerosis of native arteries of extremities with intermittent claudication, bilateral legs: Secondary | ICD-10-CM | POA: Diagnosis not present

## 2021-09-13 NOTE — Progress Notes (Signed)
?Cardiology Office Note:   ? ?Date:  09/14/2021  ? ?ID:  Donald Pope, DOB 01/04/1946, MRN [id number] ? ?PCP:  [name], MD  ?Cardiologist:  Sinclair Grooms, MD  ? ?Referring MD: [name], MD  ? ?Chief Complaint  ?Patient presents with  ? Coronary Artery Disease  ? Hyperlipidemia  ? ? ?History of Present Illness:   ? ?Donald Pope is a 76 y.o. male with a hx of previously followed by Dr. Verl Blalock and Dr. Aundra Dubin with history of nonobstructive CAD, chronic bilateral lower extremity pain, hyperlipidemia, and obstructive sleep apnea. ? ?He had a diverticular bleed earlier this year. ? ?No cardiac symptoms.  Denies dyspnea. ? ?Limited physical activity now related to spinal stenosis. ? ?Past Medical History:  ?Diagnosis Date  ? Allergy   ? Anxiety   ? Back pain   ? Benign prostatic hypertrophy   ? possible  ? Bilateral leg cramps   ? CAD, NATIVE VESSEL 10/23/2009  ? Chest pain   ? DEPRESSION 09/13/2008  ? Depression   ? False positive stress test   ? history of, suggestive of inferior ischemia  ? GERD 09/13/2008  ? History of GI bleed   ? HYPERLIPIDEMIA 09/13/2008  ? Meniere's disease   ? OBSTRUCTIVE SLEEP APNEA 10/23/2009  ? cpap  ? RBBB 10/26/2009  ? [place]  ? Sciatic nerve pain   ? Sleep apnea   ? Cpap  ? SOB (shortness of breath)   ? Spinal stenosis of lumbar region 08/18/2012  ? THROMBOCYTOPENIA 09/13/2008  ? ? ?Past Surgical History:  ?Procedure Laterality Date  ? CARDIAC CATHETERIZATION    ? 10 yrs ago  ? COLONOSCOPY  06/27/2016  ? MAXIMUM ACCESS (MAS)POSTERIOR LUMBAR INTERBODY FUSION (PLIF) 1 LEVEL N/A 05/27/2013  ? Procedure: FOR MAXIMUM ACCESS (MAS) POSTERIOR LUMBAR INTERBODY FUSION (PLIF) 1 LEVEL ,LUMBAR FOUR-FIVE;  Surgeon: Eustace Cohrs, MD;  Location: [facility];  Service: Neurosurgery;  Laterality: N/A;  FOR MAXIMUM ACCESS (MAS) POSTERIOR LUMBAR INTERBODY FUSION (PLIF) 1 LEVEL ,LUMBAR FOUR-FIVE  ? NASAL POLYP EXCISION    ? TONSILLECTOMY    ? ? ?Current Medications: ?Current Meds   ?Medication Sig  ? amitriptyline (ELAVIL) 10 MG tablet TAKE 1 TABLET BY MOUTH EVERYDAY AT BEDTIME  ? cholecalciferol (VITAMIN D) 1000 UNITS tablet Take 1,000 Units by mouth daily.   ? citalopram (CELEXA) 10 MG tablet Take 1 tablet (10 mg total) by mouth daily.  ? Coenzyme Q10 (CO Q 10 PO) Take 1 tablet by mouth daily.  ? fluticasone (FLONASE) 50 MCG/ACT nasal spray Place 2 sprays into both nostrils every other day.  ? gabapentin (NEURONTIN) 600 MG tablet Take 2 tablets (1,200 mg total) by mouth 2 (two) times daily.  ? melatonin 5 MG TABS Take 5 mg by mouth at bedtime.  ? omeprazole (PRILOSEC) 20 MG capsule Take 20 mg by mouth every other day.  ? rosuvastatin (CRESTOR) 5 MG tablet Take 5 mg by mouth daily.  ?  ? ?Allergies:   Adhesive [tape], Codeine sulfate, and Latex  ? ?Social History  ? ?Socioeconomic History  ? Marital status: Married  ?  Spouse name: Kwabena Strutz  ? Number of children: 1  ? Years of education: 70   ? Highest education level: Bachelor's degree (e.g., BA, AB, BS)  ?Occupational History  ? Occupation: retired  ?Tobacco Use  ? Smoking status: Never  ? Smokeless tobacco: Never  ?Vaping Use  ? Vaping Use: Never used  ?  Substance and Sexual Activity  ? Alcohol use: Yes  ?  Alcohol/week: 1.0 standard drink  ?  Types: 1 Glasses of wine per week  ?  Comment: 4-5 times a week  ? Drug use: No  ? Sexual activity: Not Currently  ?Other Topics Concern  ? Not on file  ?Social History Narrative  ? Lives with wife in [place] home. Son lives in [place], [place], 1 grandson.   ? Animal-lover, especially dogs.  ? Exercises routinely at [facility], enjoys yoga  ? ?Social Determinants of Health  ? ?Financial Resource Strain: Low Risk   ? Difficulty of Paying Living Expenses: Not hard at all  ?Food Insecurity: No Food Insecurity  ? Worried About Charity fundraiser in the Last Year: Never true  ? Ran Out of Food in the Last Year: Never true  ?Transportation Needs: No Transportation Needs  ? Lack of Transportation (Medical): No   ? Lack of Transportation (Non-Medical): No  ?Physical Activity: Inactive  ? Days of Exercise per Week: 0 days  ? Minutes of Exercise per Session: 0 min  ?Stress: No Stress Concern Present  ? Feeling of Stress : Only a little  ?Social Connections: Unknown  ? Frequency of Communication with Friends and Family: Three times a week  ? Frequency of Social Gatherings with Friends and Family: Twice a week  ? Attends Religious Services: More than 4 times per year  ? Active Member of Clubs or Organizations: Not on file  ? Attends Archivist Meetings: Not on file  ? Marital Status: Married  ?  ? ?Family History: ?The patient's family history includes Alcoholism in his father; COPD in his father; Colon polyps in his father; Heart disease in his father; Heart disease (age of onset: 76) in his mother; Hyperlipidemia in his sister; Hypertension in his sister. There is no history of Stomach cancer, Colon cancer, Esophageal cancer, or Ulcerative colitis. ? ?ROS:   ?Please see the history of present illness.    ?Back is causing pain.  He stopped taking aspirin after diverticular bleed.  I affirmed that he should stay off of aspirin since he does not have stents or any significant atherosclerosis.  All other systems reviewed and are negative. ? ?EKGs/Labs/Other Studies Reviewed:   ? ?The following studies were reviewed today: ? ?Lower Extremity Doppler Study 09/12/2021: ?IMPRESSION: ?Normal ankle-brachial indices. No evidence of significant bilateral ?lower extremity peripheral artery disease. ? ?EKG:  EKG right bundle, first-degree AV block, sinus rhythm, otherwise denied looking EKG.  Compared to September 2021, heart rate is slower and the PR is longer. ? ?Recent Labs: ?04/30/2021: ALT 14; BUN 17; Creatinine, Ser 1.11; Potassium 4.3; Sodium 140 ?05/16/2021: Hemoglobin 9.8; Platelets 148.0  ?Recent Lipid Panel ?   ?Component Value Date/Time  ? CHOL 131 06/21/2020 0905  ? TRIG 109 06/21/2020 0905  ? HDL 39 (L) 06/21/2020 0905   ? CHOLHDL 3.4 06/21/2020 0905  ? CHOLHDL 4 06/16/2017 0853  ? VLDL 52.4 (H) 06/16/2017 0853  ? LDLCALC 72 06/21/2020 0905  ? LDLDIRECT 89.0 06/16/2017 0853  ? ? ?Physical Exam:   ? ?VS:  BP 130/74   Pulse 70   Ht '5\' 7"'  (1.702 m)   Wt 191 lb (86.6 kg)   SpO2 98%   BMI 29.91 kg/m?    ? ?Wt Readings from Last 3 Encounters:  ?09/14/21 191 lb (86.6 kg)  ?06/05/21 190 lb (86.2 kg)  ?05/16/21 192 lb (87.1 kg)  ?  ? ?GEN: Compatible with  age.  He has lost weight after enrolling in the [facility] weight clinic at [facility].. No acute distress ?HEENT: Normal ?NECK: No JVD. ?LYMPHATICS: No lymphadenopathy ?CARDIAC: No murmur. RRR no gallop, or edema. ?VASCULAR:  Normal Pulses. No bruits. ?RESPIRATORY:  Clear to auscultation without rales, wheezing or rhonchi  ?ABDOMEN: Soft, non-tender, non-distended, No pulsatile mass, ?MUSCULOSKELETAL: No deformity  ?SKIN: Warm and dry ?NEUROLOGIC:  Alert and oriented x 3 ?PSYCHIATRIC:  Normal affect  ? ?ASSESSMENT:   ? ?1. Atherosclerosis of native coronary artery of native heart without angina pectoris   ?2. PAD (peripheral artery disease) ([place])   ?3. Hyperlipidemia, unspecified hyperlipidemia type   ?4. Obstructive sleep apnea   ?5. Prediabetes   ?6. RBBB   ? ?PLAN:   ? ?In order of problems listed above: ? ?Needs a lipid panel and c-Met.  Needs to be done here fasting or with his primary physician, Dr. Elease Hashimoto.  Recommended that he discontinue aspirin since he has had a significant GI bleed. ?We encouraged walking.  Secondary prevention discussed. ?See above concerning lipids.  Continue Crestor. ?CPAP compliance is recommended. ?Sugar should be followed closely.  A1c should be better with weight loss. ?Unchanged. ? ? ? ?Overall education and awareness concerning secondary risk prevention was discussed in detail: LDL less than 70, hemoglobin A1c less than 7, blood pressure target less than 130/80 mmHg, >150 minutes of moderate aerobic activity per week, avoidance of smoking, weight  control (via diet and exercise), and continued surveillance/management of/for obstructive sleep apnea. ? ? ? ?Medication Adjustments/Labs and Tests Ordered: ?Current medicines are reviewed at length with the patient

## 2021-09-14 ENCOUNTER — Encounter: Payer: Self-pay | Admitting: Interventional Cardiology

## 2021-09-14 ENCOUNTER — Ambulatory Visit: Payer: PPO | Admitting: Interventional Cardiology

## 2021-09-14 VITALS — BP 130/74 | HR 70 | Ht 67.0 in | Wt 191.0 lb

## 2021-09-14 DIAGNOSIS — I739 Peripheral vascular disease, unspecified: Secondary | ICD-10-CM | POA: Diagnosis not present

## 2021-09-14 DIAGNOSIS — I4510 Unspecified right bundle-branch block: Secondary | ICD-10-CM

## 2021-09-14 DIAGNOSIS — R7303 Prediabetes: Secondary | ICD-10-CM | POA: Diagnosis not present

## 2021-09-14 DIAGNOSIS — G4733 Obstructive sleep apnea (adult) (pediatric): Secondary | ICD-10-CM | POA: Diagnosis not present

## 2021-09-14 DIAGNOSIS — E785 Hyperlipidemia, unspecified: Secondary | ICD-10-CM

## 2021-09-14 DIAGNOSIS — I2510 Atherosclerotic heart disease of native coronary artery without angina pectoris: Secondary | ICD-10-CM

## 2021-09-14 NOTE — Patient Instructions (Signed)

## 2021-10-05 DIAGNOSIS — D696 Thrombocytopenia, unspecified: Secondary | ICD-10-CM | POA: Diagnosis not present

## 2021-10-05 DIAGNOSIS — I2510 Atherosclerotic heart disease of native coronary artery without angina pectoris: Secondary | ICD-10-CM | POA: Diagnosis not present

## 2021-10-05 DIAGNOSIS — G4733 Obstructive sleep apnea (adult) (pediatric): Secondary | ICD-10-CM | POA: Diagnosis not present

## 2021-10-05 DIAGNOSIS — I951 Orthostatic hypotension: Secondary | ICD-10-CM | POA: Diagnosis not present

## 2021-10-05 DIAGNOSIS — R030 Elevated blood-pressure reading, without diagnosis of hypertension: Secondary | ICD-10-CM | POA: Diagnosis not present

## 2021-10-05 DIAGNOSIS — N312 Flaccid neuropathic bladder, not elsewhere classified: Secondary | ICD-10-CM | POA: Diagnosis not present

## 2021-10-05 DIAGNOSIS — Z01818 Encounter for other preprocedural examination: Secondary | ICD-10-CM | POA: Diagnosis not present

## 2021-10-05 DIAGNOSIS — R9431 Abnormal electrocardiogram [ECG] [EKG]: Secondary | ICD-10-CM | POA: Diagnosis not present

## 2021-10-05 DIAGNOSIS — H8103 Meniere's disease, bilateral: Secondary | ICD-10-CM | POA: Diagnosis not present

## 2021-10-05 DIAGNOSIS — I739 Peripheral vascular disease, unspecified: Secondary | ICD-10-CM | POA: Diagnosis not present

## 2021-10-05 DIAGNOSIS — F39 Unspecified mood [affective] disorder: Secondary | ICD-10-CM | POA: Diagnosis not present

## 2021-10-05 DIAGNOSIS — I4510 Unspecified right bundle-branch block: Secondary | ICD-10-CM | POA: Diagnosis not present

## 2021-10-05 DIAGNOSIS — R7303 Prediabetes: Secondary | ICD-10-CM | POA: Diagnosis not present

## 2021-10-05 DIAGNOSIS — M48061 Spinal stenosis, lumbar region without neurogenic claudication: Secondary | ICD-10-CM | POA: Diagnosis not present

## 2021-10-18 DIAGNOSIS — I739 Peripheral vascular disease, unspecified: Secondary | ICD-10-CM | POA: Diagnosis not present

## 2021-10-18 DIAGNOSIS — N179 Acute kidney failure, unspecified: Secondary | ICD-10-CM | POA: Diagnosis not present

## 2021-10-18 DIAGNOSIS — D696 Thrombocytopenia, unspecified: Secondary | ICD-10-CM | POA: Diagnosis not present

## 2021-10-18 DIAGNOSIS — I951 Orthostatic hypotension: Secondary | ICD-10-CM | POA: Diagnosis not present

## 2021-10-18 DIAGNOSIS — I2510 Atherosclerotic heart disease of native coronary artery without angina pectoris: Secondary | ICD-10-CM | POA: Diagnosis not present

## 2021-10-18 DIAGNOSIS — T8484XA Pain due to internal orthopedic prosthetic devices, implants and grafts, initial encounter: Secondary | ICD-10-CM | POA: Diagnosis not present

## 2021-10-18 DIAGNOSIS — M5416 Radiculopathy, lumbar region: Secondary | ICD-10-CM | POA: Diagnosis not present

## 2021-10-18 DIAGNOSIS — R030 Elevated blood-pressure reading, without diagnosis of hypertension: Secondary | ICD-10-CM | POA: Diagnosis not present

## 2021-10-18 DIAGNOSIS — F39 Unspecified mood [affective] disorder: Secondary | ICD-10-CM | POA: Diagnosis not present

## 2021-10-18 DIAGNOSIS — H8103 Meniere's disease, bilateral: Secondary | ICD-10-CM | POA: Diagnosis not present

## 2021-10-18 DIAGNOSIS — G4733 Obstructive sleep apnea (adult) (pediatric): Secondary | ICD-10-CM | POA: Diagnosis not present

## 2021-10-18 DIAGNOSIS — M47819 Spondylosis without myelopathy or radiculopathy, site unspecified: Secondary | ICD-10-CM | POA: Diagnosis not present

## 2021-10-18 DIAGNOSIS — E785 Hyperlipidemia, unspecified: Secondary | ICD-10-CM | POA: Diagnosis not present

## 2021-10-18 DIAGNOSIS — K219 Gastro-esophageal reflux disease without esophagitis: Secondary | ICD-10-CM | POA: Diagnosis not present

## 2021-10-18 DIAGNOSIS — E611 Iron deficiency: Secondary | ICD-10-CM | POA: Diagnosis not present

## 2021-10-18 DIAGNOSIS — R7303 Prediabetes: Secondary | ICD-10-CM | POA: Diagnosis not present

## 2021-10-18 DIAGNOSIS — I4510 Unspecified right bundle-branch block: Secondary | ICD-10-CM | POA: Diagnosis not present

## 2021-10-18 DIAGNOSIS — Z9989 Dependence on other enabling machines and devices: Secondary | ICD-10-CM | POA: Diagnosis not present

## 2021-10-18 DIAGNOSIS — N312 Flaccid neuropathic bladder, not elsewhere classified: Secondary | ICD-10-CM | POA: Diagnosis not present

## 2021-10-18 DIAGNOSIS — M4807 Spinal stenosis, lumbosacral region: Secondary | ICD-10-CM | POA: Diagnosis not present

## 2021-10-18 DIAGNOSIS — Z981 Arthrodesis status: Secondary | ICD-10-CM | POA: Diagnosis not present

## 2021-10-18 DIAGNOSIS — M48061 Spinal stenosis, lumbar region without neurogenic claudication: Secondary | ICD-10-CM | POA: Diagnosis not present

## 2021-10-23 DIAGNOSIS — Z4789 Encounter for other orthopedic aftercare: Secondary | ICD-10-CM | POA: Diagnosis not present

## 2021-10-23 DIAGNOSIS — M48061 Spinal stenosis, lumbar region without neurogenic claudication: Secondary | ICD-10-CM | POA: Diagnosis not present

## 2021-10-23 DIAGNOSIS — R2681 Unsteadiness on feet: Secondary | ICD-10-CM | POA: Diagnosis not present

## 2021-10-23 DIAGNOSIS — H8103 Meniere's disease, bilateral: Secondary | ICD-10-CM | POA: Diagnosis not present

## 2021-10-23 DIAGNOSIS — I25111 Atherosclerotic heart disease of native coronary artery with angina pectoris with documented spasm: Secondary | ICD-10-CM | POA: Diagnosis not present

## 2021-10-30 DIAGNOSIS — Z4789 Encounter for other orthopedic aftercare: Secondary | ICD-10-CM | POA: Diagnosis not present

## 2021-10-30 DIAGNOSIS — Z981 Arthrodesis status: Secondary | ICD-10-CM | POA: Diagnosis not present

## 2021-11-01 DIAGNOSIS — H8101 Meniere's disease, right ear: Secondary | ICD-10-CM | POA: Insufficient documentation

## 2021-11-06 DIAGNOSIS — G4733 Obstructive sleep apnea (adult) (pediatric): Secondary | ICD-10-CM | POA: Diagnosis not present

## 2021-11-15 DIAGNOSIS — H903 Sensorineural hearing loss, bilateral: Secondary | ICD-10-CM | POA: Diagnosis not present

## 2021-11-30 DIAGNOSIS — H8101 Meniere's disease, right ear: Secondary | ICD-10-CM | POA: Diagnosis not present

## 2021-12-04 DIAGNOSIS — M79659 Pain in unspecified thigh: Secondary | ICD-10-CM | POA: Diagnosis not present

## 2021-12-04 DIAGNOSIS — Z4789 Encounter for other orthopedic aftercare: Secondary | ICD-10-CM | POA: Diagnosis not present

## 2021-12-04 DIAGNOSIS — Z981 Arthrodesis status: Secondary | ICD-10-CM | POA: Diagnosis not present

## 2021-12-06 ENCOUNTER — Telehealth: Payer: Self-pay

## 2021-12-06 NOTE — Telephone Encounter (Signed)
Pt has not had annual labs since Feb 2022. Last OV 04/30/21 MD recommended a 2 month f/u (pt canceled appt).  Pt notified of above. States he just had back surgery 6 weeks ago. Appt scheduled at his earliest convenience on 01/30/22.

## 2021-12-19 ENCOUNTER — Encounter (INDEPENDENT_AMBULATORY_CARE_PROVIDER_SITE_OTHER): Payer: Self-pay

## 2021-12-21 DIAGNOSIS — H903 Sensorineural hearing loss, bilateral: Secondary | ICD-10-CM | POA: Diagnosis not present

## 2022-01-03 ENCOUNTER — Other Ambulatory Visit: Payer: Self-pay

## 2022-01-03 NOTE — Telephone Encounter (Signed)
Patient requested refill, last prescribed by Jamse Arn, MD

## 2022-01-18 ENCOUNTER — Ambulatory Visit (INDEPENDENT_AMBULATORY_CARE_PROVIDER_SITE_OTHER): Payer: PPO | Admitting: Family Medicine

## 2022-01-18 VITALS — BP 136/82 | HR 70 | Temp 97.7°F | Wt 189.1 lb

## 2022-01-18 DIAGNOSIS — G8929 Other chronic pain: Secondary | ICD-10-CM

## 2022-01-18 DIAGNOSIS — L819 Disorder of pigmentation, unspecified: Secondary | ICD-10-CM

## 2022-01-18 DIAGNOSIS — M545 Low back pain, unspecified: Secondary | ICD-10-CM | POA: Diagnosis not present

## 2022-01-18 MED ORDER — AMITRIPTYLINE HCL 10 MG PO TABS
ORAL_TABLET | ORAL | 3 refills | Status: DC
Start: 2022-01-18 — End: 2022-05-30

## 2022-01-18 NOTE — Progress Notes (Signed)
Established Patient Office Visit  Subjective   Patient ID: Donald Pope, male    DOB: 07/21/1945  Age: 76 y.o. MRN: [id number]  Chief Complaint  Patient presents with   Toe Pain    On Left big toe. Pt reports no pain currently. Did have pain last Saturday.  Would like a doctor to look at it.    Medication Refill    On Amitriptyline    HPI   Seen for the following items  Recently noticed some discoloration left great toe.  Darker pigment near the skin fold at the base of the nail.  Was at the beach last [place] and first noticed around Friday.  He initially thought there may be some early cellulitis.  No warmth.  No fever.  No pain.  He thinks he may have applied sunscreen unequally.  No history of peripheral vascular disease.  Requesting refill of Elavil 10 mg.  He has been on this for chronic back pain for years.  Initially prescribed apparently by another physician.  No constipation issues.  In June he had surgery for lumbar stenosis at [facility] and that surgery went well.  He has had some symptom relief since the surgery.  Past Medical History:  Diagnosis Date   Allergy    Anxiety    Back pain    Benign prostatic hypertrophy    possible   Bilateral leg cramps    CAD, NATIVE VESSEL 10/23/2009   Chest pain    DEPRESSION 09/13/2008   Depression    False positive stress test    history of, suggestive of inferior ischemia   GERD 09/13/2008   History of GI bleed    HYPERLIPIDEMIA 09/13/2008   Meniere's disease    OBSTRUCTIVE SLEEP APNEA 10/23/2009   cpap   RBBB 10/26/2009   [place]   Sciatic nerve pain    Sleep apnea    Cpap   SOB (shortness of breath)    Spinal stenosis of lumbar region 08/18/2012   THROMBOCYTOPENIA 09/13/2008   Past Surgical History:  Procedure Laterality Date   CARDIAC CATHETERIZATION     10 yrs ago   COLONOSCOPY  06/27/2016   MAXIMUM ACCESS (MAS)POSTERIOR LUMBAR INTERBODY FUSION (PLIF) 1 LEVEL N/A 05/27/2013   Procedure: FOR MAXIMUM ACCESS (MAS)  POSTERIOR LUMBAR INTERBODY FUSION (PLIF) 1 LEVEL ,LUMBAR FOUR-FIVE;  Surgeon: Eustace Kachmar, MD;  Location: [place] [facility];  Service: Neurosurgery;  Laterality: N/A;  FOR MAXIMUM ACCESS (MAS) POSTERIOR LUMBAR INTERBODY FUSION (PLIF) 1 LEVEL ,LUMBAR FOUR-FIVE   NASAL POLYP EXCISION     TONSILLECTOMY      reports that he has never smoked. He has never used smokeless tobacco. He reports current alcohol use of about 1.0 standard drink of alcohol per week. He reports that he does not use drugs. family history includes Alcoholism in his father; COPD in his father; Colon polyps in his father; Heart disease in his father; Heart disease (age of onset: 67) in his mother; Hyperlipidemia in his sister; Hypertension in his sister. Allergies  Allergen Reactions   Adhesive [Tape] Rash   Codeine Sulfate Other (See Comments)    urination problems   Latex Rash    Review of Systems  Constitutional:  Negative for chills and fever.  Skin:  Negative for itching and rash.      Objective:     BP 136/82 (BP Location: Left Arm, Patient Position: Sitting, Cuff Size: Normal)   Pulse 70   Temp 97.7 F (36.5 C) (Oral)  Wt 189 lb 1.6 oz (85.8 kg)   SpO2 97%   BMI 29.62 kg/m    Physical Exam Vitals reviewed.  Constitutional:      Appearance: Normal appearance.  Cardiovascular:     Rate and Rhythm: Normal rate and regular rhythm.     Comments: Feet are both warm to touch.  Excellent capillary refill.  2+ dorsalis pedis and posterior tibial pulses bilaterally. Skin:    Comments: Left great toe reveals slight increase pigmentation change (darker pigment) just at the base of the toenail.  No warmth.  No erythema.  No necrosis.  No paronychia.  No evidence for ingrown nail.  Neurological:     Mental Status: He is alert.      No results found for any visits on 01/18/22.    The 10-year ASCVD risk score (Arnett DK, et al., 2019) is: 27.2%* (Cholesterol units were assumed)    Assessment & Plan:   #1  left great toe discoloration.  Slight increase in pigmentation dorsally and distally.  Suspect related to unequal application of sunblock.  No evidence to suggest cellulitis or other infection.  No necrosis.  Reassurance and observe  #2 chronic back pain.  History of lumbar stenosis.  Patient requesting refill of Elavil dosage.  We reviewed side effects and risk with TCA.  Has been on this for several years.  Refill Elavil 10 mg but consider tapering off  No follow-ups on file.    Carolann Littler, MD

## 2022-01-18 NOTE — Patient Instructions (Signed)
No sign of toe infection at this time  Watch for any redness, warmth, or pain.

## 2022-01-29 DIAGNOSIS — Z4789 Encounter for other orthopedic aftercare: Secondary | ICD-10-CM | POA: Diagnosis not present

## 2022-01-29 DIAGNOSIS — M5136 Other intervertebral disc degeneration, lumbar region: Secondary | ICD-10-CM | POA: Diagnosis not present

## 2022-01-29 DIAGNOSIS — Z981 Arthrodesis status: Secondary | ICD-10-CM | POA: Diagnosis not present

## 2022-01-30 ENCOUNTER — Ambulatory Visit (INDEPENDENT_AMBULATORY_CARE_PROVIDER_SITE_OTHER): Payer: PPO | Admitting: Family Medicine

## 2022-01-30 ENCOUNTER — Encounter: Payer: Self-pay | Admitting: Family Medicine

## 2022-01-30 VITALS — BP 136/76 | HR 64 | Temp 97.7°F | Ht 67.72 in | Wt 190.6 lb

## 2022-01-30 DIAGNOSIS — Z23 Encounter for immunization: Secondary | ICD-10-CM

## 2022-01-30 DIAGNOSIS — Z0000 Encounter for general adult medical examination without abnormal findings: Secondary | ICD-10-CM | POA: Diagnosis not present

## 2022-01-30 LAB — LIPID PANEL
Cholesterol: 186 mg/dL (ref 0–200)
HDL: 33.1 mg/dL — ABNORMAL LOW (ref >39.00)
NonHDL: 152.7
Total CHOL/HDL Ratio: 6
Triglycerides: 335 mg/dL — ABNORMAL HIGH (ref 0.0–149.0)
VLDL: 67 mg/dL — ABNORMAL HIGH (ref 0.0–40.0)

## 2022-01-30 LAB — CBC WITH DIFFERENTIAL/PLATELET
Basophils Absolute: 0 10*3/uL (ref 0.0–0.1)
Basophils Relative: 0.7 % (ref 0.0–3.0)
Eosinophils Absolute: 0.1 10*3/uL (ref 0.0–0.7)
Eosinophils Relative: 0.9 % (ref 0.0–5.0)
HCT: 40.5 % (ref 39.0–52.0)
Hemoglobin: 13.3 g/dL (ref 13.0–17.0)
Lymphocytes Relative: 32.7 % (ref 12.0–46.0)
Lymphs Abs: 1.8 10*3/uL (ref 0.7–4.0)
MCHC: 32.9 g/dL (ref 30.0–36.0)
MCV: 89.9 fl (ref 78.0–100.0)
Monocytes Absolute: 0.6 10*3/uL (ref 0.1–1.0)
Monocytes Relative: 10.8 % (ref 3.0–12.0)
Neutro Abs: 3 10*3/uL (ref 1.4–7.7)
Neutrophils Relative %: 54.9 % (ref 43.0–77.0)
Platelets: 121 10*3/uL — ABNORMAL LOW (ref 150.0–400.0)
RBC: 4.5 Mil/uL (ref 4.22–5.81)
RDW: 14.1 % (ref 11.5–15.5)
WBC: 5.5 10*3/uL (ref 4.0–10.5)

## 2022-01-30 LAB — BASIC METABOLIC PANEL
BUN: 21 mg/dL (ref 6–23)
CO2: 27 mEq/L (ref 19–32)
Calcium: 9.5 mg/dL (ref 8.4–10.5)
Chloride: 101 mEq/L (ref 96–112)
Creatinine, Ser: 1.18 mg/dL (ref 0.40–1.50)
GFR: 59.72 mL/min — ABNORMAL LOW (ref >60.00)
Glucose, Bld: 106 mg/dL — ABNORMAL HIGH (ref 70–99)
Potassium: 4.2 mEq/L (ref 3.5–5.1)
Sodium: 137 mEq/L (ref 135–145)

## 2022-01-30 LAB — HEPATIC FUNCTION PANEL
ALT: 16 U/L (ref 0–53)
AST: 24 U/L (ref 0–37)
Albumin: 4.4 g/dL (ref 3.5–5.2)
Alkaline Phosphatase: 49 U/L (ref 39–117)
Bilirubin, Direct: 0.1 mg/dL (ref 0.0–0.3)
Total Bilirubin: 0.5 mg/dL (ref 0.2–1.2)
Total Protein: 7.5 g/dL (ref 6.0–8.3)

## 2022-01-30 LAB — LDL CHOLESTEROL, DIRECT: Direct LDL: 129 mg/dL

## 2022-01-30 LAB — TSH: TSH: 4.28 u[IU]/mL (ref 0.35–5.50)

## 2022-01-30 MED ORDER — BACLOFEN 10 MG PO TABS: 10.0000 mg | ORAL_TABLET | Freq: Three times a day (TID) | ORAL | 1 refills | Status: DC

## 2022-01-30 NOTE — Progress Notes (Signed)
Established Patient Office Visit  Subjective   Patient ID: Donald Pope, male    DOB: 01-02-1946  Age: 76 y.o. MRN: [id number]  Chief Complaint  Patient presents with   Annual Exam    HPI   Mr. Heinbaugh seen for physical exam.  He has history of lumbar stenosis and had surgery at [facility] last summer and is doing fairly well since then.  He is having frequent muscle cramps or possibly spasms in both calves at night.  He describes 4 out of 10 pain that sometimes can last for hours.  Denies restless leg symptoms.  His surgeon at [facility] Suggested possible trial of baclofen.  He would like to consider.  He also has history of obstructive sleep apnea and history of diverticulosis bleed.  No recent bloody stools.  No dizziness.  He has reported Mnire's disease but no recent vertigo episodes.  Health maintenance reviewed  -Needs flu vaccine -Other vaccines all up-to-date -Colonoscopy due next spring  Health Maintenance  Topic Date Due   COVID-19 Vaccine (6 - Pfizer series) 06/07/2021   INFLUENZA VACCINE  12/11/2021   Hepatitis C Screening  07/19/2043 (Originally 01/22/1964)   COLONOSCOPY (Pts 45-27yr Insurance coverage will need to be confirmed)  07/12/2022   TETANUS/TDAP  05/28/2028   Pneumonia Vaccine 76 Years old  Completed   Zoster Vaccines- Shingrix  Completed   HPV VACCINES  Aged Out   Family history-both parents had heart disease.  Sister with hypertension and hyperlipidemia.  His father had history of alcoholism and history of colon polyps.  Social history-he is married.  He is retired.  No history of smoking.  No regular alcohol use.  Past Medical History:  Diagnosis Date   Allergy    Anxiety    Back pain    Benign prostatic hypertrophy    possible   Bilateral leg cramps    CAD, NATIVE VESSEL 10/23/2009   Chest pain    DEPRESSION 09/13/2008   Depression    False positive stress test    history of, suggestive of inferior ischemia   GERD 09/13/2008   History of GI  bleed    HYPERLIPIDEMIA 09/13/2008   Meniere's disease    OBSTRUCTIVE SLEEP APNEA 10/23/2009   cpap   [place]BBB 10/26/2009   [place]   Sciatic nerve pain    Sleep apnea    Cpap   SOB (shortness of breath)    Spinal stenosis of lumbar region 08/18/2012   THROMBOCYTOPENIA 09/13/2008   Past Surgical History:  Procedure Laterality Date   CARDIAC CATHETERIZATION     10 yrs ago   COLONOSCOPY  06/27/2016   MAXIMUM ACCESS (MAS)POSTERIOR LUMBAR INTERBODY FUSION (PLIF) 1 LEVEL N/A 05/27/2013   Procedure: FOR MAXIMUM ACCESS (MAS) POSTERIOR LUMBAR INTERBODY FUSION (PLIF) 1 LEVEL ,LUMBAR FOUR-FIVE;  Surgeon: DEustace Ohmer MD;  [facility];  Service: Neurosurgery;  Laterality: N/A;  FOR MAXIMUM ACCESS (MAS) POSTERIOR LUMBAR INTERBODY FUSION (PLIF) 1 LEVEL ,LUMBAR FOUR-FIVE   NASAL POLYP EXCISION     TONSILLECTOMY      reports that he has never smoked. He has never used smokeless tobacco. He reports current alcohol use of about 1.0 standard drink of alcohol per week. He reports that he does not use drugs. family history includes Alcoholism in his father; COPD in his father; Colon polyps in his father; Heart disease in his father; Heart disease (age of onset: 644 in his mother; Hyperlipidemia in his sister; Hypertension in his sister. Allergies  Allergen Reactions   Adhesive [Tape] Rash   Codeine Sulfate Other (See Comments)    urination problems   Latex Rash       Review of Systems  Constitutional:  Negative for chills, fever, malaise/fatigue and weight loss.  HENT:  Negative for hearing loss.   Eyes:  Negative for blurred vision and double vision.  Respiratory:  Negative for cough and shortness of breath.   Cardiovascular:  Negative for chest pain, palpitations and leg swelling.  Gastrointestinal:  Negative for abdominal pain, blood in stool, constipation and diarrhea.  Genitourinary:  Negative for dysuria.  Skin:  Negative for rash.  Neurological:  Negative for dizziness,  speech change, seizures, loss of consciousness and headaches.  Psychiatric/Behavioral:  Negative for depression.       Objective:     BP 136/76 (BP Location: Left Arm, Patient Position: Sitting, Cuff Size: Normal)   Pulse 64   Temp 97.7 F (36.5 C) (Oral)   Ht 5' 7.72" (1.72 m)   Wt 190 lb 9.6 oz (86.5 kg)   SpO2 98%   BMI 29.22 kg/m    Physical Exam Constitutional:      General: He is not in acute distress.    Appearance: He is well-developed.  HENT:     Head: Normocephalic and atraumatic.     Right Ear: External ear normal.     Left Ear: External ear normal.  Eyes:     Conjunctiva/sclera: Conjunctivae normal.     Pupils: Pupils are equal, round, and reactive to light.  Neck:     Thyroid: No thyromegaly.  Cardiovascular:     Rate and Rhythm: Normal rate and regular rhythm.     Heart sounds: Normal heart sounds. No murmur heard. Pulmonary:     Effort: No respiratory distress.     Breath sounds: No wheezing or rales.  Abdominal:     General: Bowel sounds are normal. There is no distension.     Palpations: Abdomen is soft. There is no mass.     Tenderness: There is no abdominal tenderness. There is no guarding or rebound.     Comments: Small ventral hernia.  This is soft and nontender.  Musculoskeletal:     Cervical back: Normal range of motion and neck supple.  Lymphadenopathy:     Cervical: No cervical adenopathy.  Skin:    Findings: No rash.  Neurological:     Mental Status: He is alert and oriented to person, place, and time.     Cranial Nerves: No cranial nerve deficit.      No results found for any visits on 01/30/22.    The 10-year ASCVD risk score (Arnett DK, et al., 2019) is: 29%* (Cholesterol units were assumed)    Assessment & Plan:   Problem List Items Addressed This Visit   None Visit Diagnoses     Physical exam    -  Primary   Relevant Orders   Basic metabolic panel   Lipid panel   CBC with Differential/Platelet   TSH   Hepatic  function panel     Patient has history of lumbar stenosis and surgery this summer with some improvement in his symptoms.  He is having frequent calf pain when he is horizontal at night and this sounds more like muscle spasms.  -We discussed trial of baclofen 10 mg nightly with caution about additional sedation with this.  -Flu vaccine given.  Other immunizations up-to-date  -Reminder for repeat colonoscopy this spring around March  -  We did discuss new RSV vaccine and COVID booster which she will consider getting at pharmacy  No follow-ups on file.    Carolann Littler, MD

## 2022-01-30 NOTE — Addendum Note (Signed)
Addended by: Nilda Riggs on: 01/30/2022 09:50 AM   Modules accepted: Orders

## 2022-02-17 ENCOUNTER — Other Ambulatory Visit: Payer: Self-pay | Admitting: Family Medicine

## 2022-03-15 ENCOUNTER — Other Ambulatory Visit: Payer: Self-pay | Admitting: Family Medicine

## 2022-03-18 ENCOUNTER — Ambulatory Visit (INDEPENDENT_AMBULATORY_CARE_PROVIDER_SITE_OTHER): Payer: PPO | Admitting: Family Medicine

## 2022-03-18 ENCOUNTER — Encounter: Payer: Self-pay | Admitting: Family Medicine

## 2022-03-18 VITALS — BP 138/72 | HR 81 | Temp 97.6°F | Ht 67.72 in | Wt 194.9 lb

## 2022-03-18 DIAGNOSIS — R21 Rash and other nonspecific skin eruption: Secondary | ICD-10-CM

## 2022-03-18 MED ORDER — CICLOPIROX OLAMINE 0.77 % EX CREA: TOPICAL_CREAM | Freq: Two times a day (BID) | CUTANEOUS | 1 refills | Status: DC

## 2022-03-18 MED ORDER — FLUCONAZOLE 100 MG PO TABS: 100.0000 mg | ORAL_TABLET | Freq: Every day | ORAL | 0 refills | Status: DC

## 2022-03-18 NOTE — Patient Instructions (Signed)
Try to leave off the Baclofen for now- though doubt this is a drug rash  Keep areas as dry as possible  Let me know in one week if no improvement.Marland Kitchen

## 2022-03-18 NOTE — Progress Notes (Signed)
Established Patient Office Visit  Subjective   Patient ID: Donald Pope, male    DOB: Jun 12, 1945  Age: 76 y.o. MRN: [id number]  Chief Complaint  Patient presents with   Rash    Patient complains of rash, x2 weeks, Tried Ketoconazole cream with little relief, Patient reports slight pain    HPI   Donald Pope seen with rashes started 2 to 3 weeks ago axillary region and groin bilaterally.  Erythematous.  Some weepy drainage.  Denies any involvement elsewhere.  No fever.  No recent antibiotics.  No recent prednisone use.  He had some leftover ketoconazole cream which he has applied without any improvement.  He set up appointment to see his dermatologist at [facility] dermatology but cannot get in until early December.  Denies history of similar rash.  He recently started baclofen and initially wondered if this was some sort of drug reaction.  Past Medical History:  Diagnosis Date   Allergy    Anxiety    Back pain    Benign prostatic hypertrophy    possible   Bilateral leg cramps    CAD, NATIVE VESSEL 10/23/2009   Chest pain    DEPRESSION 09/13/2008   Depression    False positive stress test    history of, suggestive of inferior ischemia   GERD 09/13/2008   History of GI bleed    HYPERLIPIDEMIA 09/13/2008   Meniere's disease    OBSTRUCTIVE SLEEP APNEA 10/23/2009   cpap   RBBB 10/26/2009   [place]   Sciatic nerve pain    Sleep apnea    Cpap   SOB (shortness of breath)    Spinal stenosis of lumbar region 08/18/2012   THROMBOCYTOPENIA 09/13/2008   Past Surgical History:  Procedure Laterality Date   CARDIAC CATHETERIZATION     10 yrs ago   COLONOSCOPY  06/27/2016   MAXIMUM ACCESS (MAS)POSTERIOR LUMBAR INTERBODY FUSION (PLIF) 1 LEVEL N/A 05/27/2013   Procedure: FOR MAXIMUM ACCESS (MAS) POSTERIOR LUMBAR INTERBODY FUSION (PLIF) 1 LEVEL ,LUMBAR FOUR-FIVE;  Surgeon: Eustace Kreutzer, MD;  Location: [place] [facility];  Service: Neurosurgery;  Laterality: N/A;  FOR MAXIMUM ACCESS (MAS)  POSTERIOR LUMBAR INTERBODY FUSION (PLIF) 1 LEVEL ,LUMBAR FOUR-FIVE   NASAL POLYP EXCISION     TONSILLECTOMY      reports that he has never smoked. He has never used smokeless tobacco. He reports current alcohol use of about 1.0 standard drink of alcohol per week. He reports that he does not use drugs. family history includes Alcoholism in his father; COPD in his father; Colon polyps in his father; Heart disease in his father; Heart disease (age of onset: 41) in his mother; Hyperlipidemia in his sister; Hypertension in his sister. Allergies  Allergen Reactions   Adhesive [Tape] Rash   Codeine Sulfate Other (See Comments)    urination problems   Latex Rash    Review of Systems  Constitutional:  Negative for chills and fever.  Skin:  Positive for rash.      Objective:     BP 138/72 (BP Location: Left Arm, Patient Position: Sitting, Cuff Size: Normal)   Pulse 81   Temp 97.6 F (36.4 C) (Oral)   Ht 5' 7.72" (1.72 m)   Wt 194 lb 14.4 oz (88.4 kg)   SpO2 98%   BMI 29.88 kg/m    Physical Exam Vitals reviewed.  Constitutional:      Appearance: Normal appearance.  Cardiovascular:     Rate and Rhythm: Normal rate.  Pulmonary:     Effort: Pulmonary effort is normal.     Breath sounds: Normal breath sounds.  Skin:    Findings: Rash present.     Comments: He has areas of erythema which are fairly well demarcated and nonscaly both axillary regions.  These are about 2 x 3 cm.  He has very similar though more diffuse rash in the groin bilaterally with a little bit of weeping edema.  Nonscaly.  Well demarcated borders.  Neurological:     Mental Status: He is alert.      No results found for any visits on 03/18/22.    The 10-year ASCVD risk score (Arnett DK, et al., 2019) is: 33.9%    Assessment & Plan:   Patient presents with acute onset 2 to 3 weeks ago of bilateral skin rash confined to the axillary and groin region.  Differential is intertrigo versus inverse psoriasis  versus other.  -Keep area dry as possible -Trial of Loprox cream to use twice daily to affected areas -Fluconazole 100 mg once daily for 7 days -Touch base if no improvement in the next week  No follow-ups on file.    Carolann Littler, MD

## 2022-03-26 ENCOUNTER — Ambulatory Visit (INDEPENDENT_AMBULATORY_CARE_PROVIDER_SITE_OTHER): Payer: PPO | Admitting: Family Medicine

## 2022-03-26 VITALS — BP 130/68 | HR 87 | Temp 97.6°F | Wt 194.5 lb

## 2022-03-26 DIAGNOSIS — L408 Other psoriasis: Secondary | ICD-10-CM

## 2022-03-26 MED ORDER — TRIAMCINOLONE ACETONIDE 0.1 % EX CREA: 1.0000 | TOPICAL_CREAM | Freq: Two times a day (BID) | CUTANEOUS | 2 refills | Status: DC

## 2022-03-26 NOTE — Progress Notes (Signed)
Established Patient Office Visit  Subjective   Patient ID: Donald Pope, male    DOB: 04/02/1946  Age: 76 y.o. MRN: [id number]  Chief Complaint  Patient presents with   Follow-up    Getting a little better but not to the degree he would like it to be. Did finish the cream     HPI   Donald Pope is seen for follow-up regarding bilateral axillary and groin rash.  He was seen here on the sixth.  He states the rash been going on for 2 to 3 weeks at that time.  Some weepy drainage and well demarcated and erythematous.  He had applied some ketoconazole cream without improvement.  He was treated with fluconazole and Loprox cream and minimal if any improvement.  Inverse psoriasis was also considered in differential.  Past Medical History:  Diagnosis Date   Allergy    Anxiety    Back pain    Benign prostatic hypertrophy    possible   Bilateral leg cramps    CAD, NATIVE VESSEL 10/23/2009   Chest pain    DEPRESSION 09/13/2008   Depression    False positive stress test    history of, suggestive of inferior ischemia   GERD 09/13/2008   History of GI bleed    HYPERLIPIDEMIA 09/13/2008   Meniere's disease    OBSTRUCTIVE SLEEP APNEA 10/23/2009   cpap   RBBB 10/26/2009   [facility]   Sciatic nerve pain    Sleep apnea    Cpap   SOB (shortness of breath)    Spinal stenosis of lumbar region 08/18/2012   THROMBOCYTOPENIA 09/13/2008   Past Surgical History:  Procedure Laterality Date   CARDIAC CATHETERIZATION     10 yrs ago   COLONOSCOPY  06/27/2016   MAXIMUM ACCESS (MAS)POSTERIOR LUMBAR INTERBODY FUSION (PLIF) 1 LEVEL N/A 05/27/2013   Procedure: FOR MAXIMUM ACCESS (MAS) POSTERIOR LUMBAR INTERBODY FUSION (PLIF) 1 LEVEL ,LUMBAR FOUR-FIVE;  Surgeon: Eustace Agner, MD;  Location: [facility];  Service: Neurosurgery;  Laterality: N/A;  FOR MAXIMUM ACCESS (MAS) POSTERIOR LUMBAR INTERBODY FUSION (PLIF) 1 LEVEL ,LUMBAR FOUR-FIVE   NASAL POLYP EXCISION     TONSILLECTOMY      reports that he  has never smoked. He has never used smokeless tobacco. He reports current alcohol use of about 1.0 standard drink of alcohol per week. He reports that he does not use drugs. family history includes Alcoholism in his father; COPD in his father; Colon polyps in his father; Heart disease in his father; Heart disease (age of onset: 73) in his mother; Hyperlipidemia in his sister; Hypertension in his sister. Allergies  Allergen Reactions   Adhesive [Tape] Rash   Codeine Sulfate Other (See Comments)    urination problems   Latex Rash    Review of Systems  Constitutional:  Negative for chills and fever.  Skin:  Positive for rash.      Objective:     BP 130/68 (BP Location: Left Arm, Patient Position: Sitting, Cuff Size: Normal)   Pulse 87   Temp 97.6 F (36.4 C) (Oral)   Wt 194 lb 8 oz (88.2 kg)   SpO2 97%   BMI 29.82 kg/m    Physical Exam Vitals reviewed.  Constitutional:      Appearance: Normal appearance.  Cardiovascular:     Rate and Rhythm: Normal rate and regular rhythm.  Skin:    Findings: Rash present.     Comments: Well-demarcated erythematous patches axillary region bilaterally and  similar rash groin bilaterally.  Nonscaly.  Nonvesicular  Neurological:     Mental Status: He is alert.      No results found for any visits on 03/26/22.    The 10-year ASCVD risk score (Arnett DK, et al., 2019) is: 31.1%    Assessment & Plan:   Bilateral axillary and groin rash.  Question inverse psoriasis.  -Stop topical antifungal -Start triamcinolone 0.1% cream twice daily -Touch base if not improving in the next couple of weeks. -He is encouraged to keep appointment with dermatologist. -Consider possible skin punch biopsy if not improved in a couple weeks  No follow-ups on file.    Carolann Littler, MD

## 2022-03-26 NOTE — Patient Instructions (Signed)
?  Inverse psoriasis  Let's try the topical steroid and let me know if no improvement in 2 weeks.

## 2022-03-27 DIAGNOSIS — L400 Psoriasis vulgaris: Secondary | ICD-10-CM | POA: Diagnosis not present

## 2022-03-27 DIAGNOSIS — Z85828 Personal history of other malignant neoplasm of skin: Secondary | ICD-10-CM | POA: Diagnosis not present

## 2022-04-15 DIAGNOSIS — L400 Psoriasis vulgaris: Secondary | ICD-10-CM | POA: Diagnosis not present

## 2022-04-15 DIAGNOSIS — L821 Other seborrheic keratosis: Secondary | ICD-10-CM | POA: Diagnosis not present

## 2022-04-15 DIAGNOSIS — L218 Other seborrheic dermatitis: Secondary | ICD-10-CM | POA: Diagnosis not present

## 2022-04-15 DIAGNOSIS — Z85828 Personal history of other malignant neoplasm of skin: Secondary | ICD-10-CM | POA: Diagnosis not present

## 2022-04-15 DIAGNOSIS — L820 Inflamed seborrheic keratosis: Secondary | ICD-10-CM | POA: Diagnosis not present

## 2022-05-16 DIAGNOSIS — G4733 Obstructive sleep apnea (adult) (pediatric): Secondary | ICD-10-CM | POA: Diagnosis not present

## 2022-05-30 ENCOUNTER — Telehealth (INDEPENDENT_AMBULATORY_CARE_PROVIDER_SITE_OTHER): Payer: PPO | Admitting: Family Medicine

## 2022-05-30 VITALS — Ht 67.72 in | Wt 194.5 lb

## 2022-05-30 DIAGNOSIS — U071 COVID-19: Secondary | ICD-10-CM | POA: Diagnosis not present

## 2022-05-30 MED ORDER — NIRMATRELVIR/RITONAVIR (PAXLOVID)TABLET: 3.0000 | ORAL_TABLET | Freq: Two times a day (BID) | ORAL | 0 refills | Status: AC

## 2022-05-30 MED ORDER — BENZONATATE 100 MG PO CAPS: 100.0000 mg | ORAL_CAPSULE | Freq: Three times a day (TID) | ORAL | 0 refills | Status: DC | PRN

## 2022-05-30 NOTE — Progress Notes (Signed)
   Established Patient Office Visit  Subjective   Patient ID: Donald Pope, male    DOB: [birth year]  Age: 77 y.o. MRN: [id number]  Chief Complaint  Patient presents with   Nasal Congestion    Patient complains of nasal congestion, x6 weeks    Cough    Patient complains of cough, x6 weeks, Productive cough with sputum,    Covid Positive    Patient tested positive for Covid today    I connected with  [name] on 05/30/22 by a video enabled telemedicine application and verified that I am speaking with the correct person using two identifiers.   I discussed the limitations of evaluation and management by telemedicine. The patient expressed understanding and agreed to proceed.   Patient location: Home address  Provider location: [facility] office  Patient reports that he has had nasal congestion for a while, states the runny nose and cough has been off and on since before christmas. but then last night coughing became much worse, no difficulty breathing, just coughing. Since it got worse he tested himself today and was positive for COVID.  States his wife is also positive today as well. No chest pain, no shortness of breath or difficulty breathing, is producing some mucus today. I reviewed his medications, medical history, and his kidney function testing. We discussed prescribing Paxlovid and he is agreeable to starting this medication.      Review of Systems  Constitutional:  Negative for diaphoresis and fever.  Respiratory:  Positive for cough and sputum production. Negative for wheezing.   Cardiovascular:  Negative for chest pain.      Objective:     Ht 5' 7.72" (1.72 m)   Wt 194 lb 8 oz (88.2 kg)   BMI 29.82 kg/m    Physical Exam Vitals reviewed.  Constitutional:      Appearance: Normal appearance. He is normal weight.  HENT:     Nose: Congestion present.  Pulmonary:     Effort: Pulmonary effort is normal.  Neurological:     Mental Status: He is alert  and oriented to person, place, and time. Mental status is at baseline.  Psychiatric:        Mood and Affect: Mood normal.      No results found for any visits on 05/30/22.    The 10-year ASCVD risk score (Arnett DK, et al., 2019) is: 31.1%    Assessment & Plan:   Problem List Items Addressed This Visit   None Visit Diagnoses     COVID-19    -  Primary   Relevant Medications   nirmatrelvir/ritonavir (PAXLOVID) 20 x 150 MG & 10 x '100MG'$  TABS   benzonatate (TESSALON) 100 MG capsule     Given the patient's age and comorbidities, will treat with paxlovid, 3 tablets BID for 5 days. I have reviewed the patient's medications and his GFR which is 59.72. Patient will also take PRN benzonatate 100 mg BID PRN cough, he is already taking mucinex DM and I encouraged him to continue this medication as well. I gave instructions on isolation and mask wearing after he has been in isolation for 7 days from the first day of symptoms. RTC is sx worsen or persist.  I spent a total of 20 minutes with patient during this face to face, video enabled encounter  No follow-ups on file.    Farrel Conners, MD

## 2022-06-11 ENCOUNTER — Ambulatory Visit: Payer: PPO

## 2022-06-14 ENCOUNTER — Other Ambulatory Visit: Payer: Self-pay | Admitting: Family Medicine

## 2022-06-17 NOTE — Telephone Encounter (Signed)
Rx done. 

## 2022-06-18 DIAGNOSIS — Z981 Arthrodesis status: Secondary | ICD-10-CM | POA: Diagnosis not present

## 2022-06-18 DIAGNOSIS — Z9889 Other specified postprocedural states: Secondary | ICD-10-CM | POA: Diagnosis not present

## 2022-06-18 DIAGNOSIS — Z4789 Encounter for other orthopedic aftercare: Secondary | ICD-10-CM | POA: Diagnosis not present

## 2022-06-19 ENCOUNTER — Telehealth: Payer: Self-pay | Admitting: Family Medicine

## 2022-06-19 NOTE — Telephone Encounter (Signed)
I returned patients call   left message asking him to call [phone]   Patient left message 06/18/22 wanting a call back regarding his appt on 2/9

## 2022-06-21 ENCOUNTER — Ambulatory Visit: Payer: PPO

## 2022-07-03 ENCOUNTER — Ambulatory Visit (INDEPENDENT_AMBULATORY_CARE_PROVIDER_SITE_OTHER): Payer: PPO

## 2022-07-03 VITALS — BP 122/62 | HR 66 | Temp 97.9°F | Ht 67.72 in | Wt 194.7 lb

## 2022-07-03 DIAGNOSIS — Z0000 Encounter for general adult medical examination without abnormal findings: Secondary | ICD-10-CM

## 2022-07-03 NOTE — Patient Instructions (Addendum)
Donald Pope , Thank you for taking time to come for your Medicare Wellness Visit. I appreciate your ongoing commitment to your health goals. Please review the following plan we discussed and let me know if I can assist you in the future.   These are the goals we discussed:  Goals       Cut out extra servings      Increase physical activity (pt-stated)      I would like to lose about 10-15 pounds.      Lose 10 lbs      Patient Stated      06/05/2021, get surgery to get back to normal        This is a list of the screening recommended for you and due dates:  Health Maintenance  Topic Date Due   COVID-19 Vaccine (7 - 2023-24 season) 07/19/2022*   Hepatitis C Screening: USPSTF Recommendation to screen - Ages 18-79 yo.  07/19/2043*   Colon Cancer Screening  07/12/2022   DTaP/Tdap/Td vaccine (3 - Td or Tdap) 01/12/2023   Medicare Annual Wellness Visit  07/04/2023   Pneumonia Vaccine  Completed   Flu Shot  Completed   Zoster (Shingles) Vaccine  Completed   HPV Vaccine  Aged Out  *Topic was postponed. The date shown is not the original due date.    Advanced directives: Please bring a copy of your health care power of attorney and living will to the office to be added to your chart at your convenience.   Conditions/risks identified: None  Next appointment: Follow up in one year for your annual wellness visit.    Preventive Care 1 Years and Older, Male  Preventive care refers to lifestyle choices and visits with your health care provider that can promote health and wellness. What does preventive care include? A yearly physical exam. This is also called an annual well check. Dental exams once or twice a year. Routine eye exams. Ask your health care provider how often you should have your eyes checked. Personal lifestyle choices, including: Daily care of your teeth and gums. Regular physical activity. Eating a healthy diet. Avoiding tobacco and drug use. Limiting alcohol  use. Practicing safe sex. Taking low doses of aspirin every day. Taking vitamin and mineral supplements as recommended by your health care provider. What happens during an annual well check? The services and screenings done by your health care provider during your annual well check will depend on your age, overall health, lifestyle risk factors, and family history of disease. Counseling  Your health care provider may ask you questions about your: Alcohol use. Tobacco use. Drug use. Emotional well-being. Home and relationship well-being. Sexual activity. Eating habits. History of falls. Memory and ability to understand (cognition). Work and work Statistician. Screening  You may have the following tests or measurements: Height, weight, and BMI. Blood pressure. Lipid and cholesterol levels. These may be checked every 5 years, or more frequently if you are over 29 years old. Skin check. Lung cancer screening. You may have this screening every year starting at age 81 if you have a 30-pack-year history of smoking and currently smoke or have quit within the past 15 years. Fecal occult blood test (FOBT) of the stool. You may have this test every year starting at age 47. Flexible sigmoidoscopy or colonoscopy. You may have a sigmoidoscopy every 5 years or a colonoscopy every 10 years starting at age 31. Prostate cancer screening. Recommendations will vary depending on your family history and other  risks. Hepatitis C blood test. Hepatitis B blood test. Sexually transmitted disease (STD) testing. Diabetes screening. This is done by checking your blood sugar (glucose) after you have not eaten for a while (fasting). You may have this done every 1-3 years. Abdominal aortic aneurysm (AAA) screening. You may need this if you are a current or former smoker. Osteoporosis. You may be screened starting at age 32 if you are at high risk. Talk with your health care provider about your test results,  treatment options, and if necessary, the need for more tests. Vaccines  Your health care provider may recommend certain vaccines, such as: Influenza vaccine. This is recommended every year. Tetanus, diphtheria, and acellular pertussis (Tdap, Td) vaccine. You may need a Td booster every 10 years. Zoster vaccine. You may need this after age 61. Pneumococcal 13-valent conjugate (PCV13) vaccine. One dose is recommended after age 14. Pneumococcal polysaccharide (PPSV23) vaccine. One dose is recommended after age 98. Talk to your health care provider about which screenings and vaccines you need and how often you need them. This information is not intended to replace advice given to you by your health care provider. Make sure you discuss any questions you have with your health care provider. Document Released: 05/26/2015 Document Revised: 01/17/2016 Document Reviewed: 02/28/2015 Elsevier Interactive Patient Education  2017 [place] Prevention in the Home Falls can cause injuries. They can happen to people of all ages. There are many things you can do to make your home safe and to help prevent falls. What can I do on the outside of my home? Regularly fix the edges of walkways and driveways and fix any cracks. Remove anything that might make you trip as you walk through a door, such as a raised step or threshold. Trim any bushes or trees on the path to your home. Use bright outdoor lighting. Clear any walking paths of anything that might make someone trip, such as rocks or tools. Regularly check to see if handrails are loose or broken. Make sure that both sides of any steps have handrails. Any raised decks and porches should have guardrails on the edges. Have any leaves, snow, or ice cleared regularly. Use sand or salt on walking paths during winter. Clean up any spills in your garage right away. This includes oil or grease spills. What can I do in the bathroom? Use night  lights. Install grab bars by the toilet and in the tub and shower. Do not use towel bars as grab bars. Use non-skid mats or decals in the tub or shower. If you need to sit down in the shower, use a plastic, non-slip stool. Keep the floor dry. Clean up any water that spills on the floor as soon as it happens. Remove soap buildup in the tub or shower regularly. Attach bath mats securely with double-sided non-slip rug tape. Do not have throw rugs and other things on the floor that can make you trip. What can I do in the bedroom? Use night lights. Make sure that you have a light by your bed that is easy to reach. Do not use any sheets or blankets that are too big for your bed. They should not hang down onto the floor. Have a firm chair that has side arms. You can use this for support while you get dressed. Do not have throw rugs and other things on the floor that can make you trip. What can I do in the kitchen? Clean up any spills right away.  Avoid walking on wet floors. Keep items that you use a lot in easy-to-reach places. If you need to reach something above you, use a strong step stool that has a grab bar. Keep electrical cords out of the way. Do not use floor polish or wax that makes floors slippery. If you must use wax, use non-skid floor wax. Do not have throw rugs and other things on the floor that can make you trip. What can I do with my stairs? Do not leave any items on the stairs. Make sure that there are handrails on both sides of the stairs and use them. Fix handrails that are broken or loose. Make sure that handrails are as long as the stairways. Check any carpeting to make sure that it is firmly attached to the stairs. Fix any carpet that is loose or worn. Avoid having throw rugs at the top or bottom of the stairs. If you do have throw rugs, attach them to the floor with carpet tape. Make sure that you have a light switch at the top of the stairs and the bottom of the stairs. If  you do not have them, ask someone to add them for you. What else can I do to help prevent falls? Wear shoes that: Do not have high heels. Have rubber bottoms. Are comfortable and fit you well. Are closed at the toe. Do not wear sandals. If you use a stepladder: Make sure that it is fully opened. Do not climb a closed stepladder. Make sure that both sides of the stepladder are locked into place. Ask someone to hold it for you, if possible. Clearly mark and make sure that you can see: Any grab bars or handrails. First and last steps. Where the edge of each step is. Use tools that help you move around (mobility aids) if they are needed. These include: Canes. Walkers. Scooters. Crutches. Turn on the lights when you go into a dark area. Replace any light bulbs as soon as they burn out. Set up your furniture so you have a clear path. Avoid moving your furniture around. If any of your floors are uneven, fix them. If there are any pets around you, be aware of where they are. Review your medicines with your doctor. Some medicines can make you feel dizzy. This can increase your chance of falling. Ask your doctor what other things that you can do to help prevent falls. This information is not intended to replace advice given to you by your health care provider. Make sure you discuss any questions you have with your health care provider. Document Released: 02/23/2009 Document Revised: 10/05/2015 Document Reviewed: 06/03/2014 Elsevier Interactive Patient Education  2017 [employer].

## 2022-07-03 NOTE — Progress Notes (Signed)
Subjective:   HALL ASHBECK is a 77 y.o. male who presents for Medicare Annual/Subsequent preventive examination.  Review of Systems      Cardiac Risk Factors include: advanced age (>42mn, >>75women);male gender     Objective:    Today's Vitals   07/03/22 1240  BP: 122/62  Pulse: 66  Temp: 97.9 F (36.6 C)  SpO2: 96%  Weight: 194 lb 11.2 oz (88.3 kg)  Height: 5' 7.72" (1.72 m)   Body mass index is 29.85 kg/m.     06/05/2021    3:01 PM 04/20/2021   10:24 AM 02/05/2021    1:59 PM 06/28/2020   10:03 AM 06/07/2019   11:41 AM 06/01/2018   11:25 AM 05/14/2017    4:23 PM  Advanced Directives  Does Patient Have a Medical Advance Directive? Yes Yes Yes Yes Yes Yes Yes  Type of AParamedicof [place]oll[place]Living will [place]Living will [place]Living will HSix Mile RunLiving will HKayak PointLiving will HEchelonLiving will   Does patient want to make changes to medical advance directive?   No - Patient declined No - Patient declined No - Patient declined No - Patient declined   Copy of [place]in Chart? No - copy requested  Yes - validated most recent copy scanned in chart (See row information) No - copy requested No - copy requested No - copy requested     Current Medications (verified) Outpatient Encounter Medications as of 07/03/2022  Medication Sig   ascorbic acid (VITAMIN C) 500 MG tablet Take by mouth.   baclofen (LIORESAL) 10 MG tablet TAKE 1 TABLET (10 MG TOTAL) BY MOUTH 3 (THREE) TIMES DAILY. AS NEEDED FOR MUSCLE SPASM   citalopram (CELEXA) 10 MG tablet TAKE 1 TABLET BY MOUTH EVERY DAY   Coenzyme Q10 (CO Q 10 PO) Take 1 tablet by mouth daily.   cyanocobalamin (VITAMIN B12) 1000 MCG tablet Take by mouth.   fluticasone (FLONASE) 50 MCG/ACT nasal spray Place 2 sprays into both nostrils every other day.   gabapentin (NEURONTIN) 600 MG tablet  TAKE 2 TABLETS BY MOUTH 2 TIMES DAILY.   melatonin 5 MG TABS Take 5 mg by mouth at bedtime.   Multiple Vitamin (MULTI-VITAMIN) tablet Take 1 tablet by mouth daily.   omeprazole (PRILOSEC) 20 MG capsule Take 20 mg by mouth every other day.   rosuvastatin (CRESTOR) 5 MG tablet Take 5 mg by mouth daily.   triamcinolone cream (KENALOG) 0.1 % Apply 1 Application topically 2 (two) times daily.   [DISCONTINUED] benzonatate (TESSALON) 100 MG capsule Take 1 capsule (100 mg total) by mouth 3 (three) times daily as needed for cough.   No facility-administered encounter medications on file as of 07/03/2022.    Allergies (verified) Adhesive [tape], Codeine sulfate, and Latex   History: Past Medical History:  Diagnosis Date   Allergy    Anxiety    Back pain    Benign prostatic hypertrophy    possible   Bilateral leg cramps    CAD, NATIVE VESSEL 10/23/2009   Chest pain    DEPRESSION 09/13/2008   Depression    False positive stress test    history of, suggestive of inferior ischemia   GERD 09/13/2008   History of GI bleed    HYPERLIPIDEMIA 09/13/2008   Meniere's disease    OBSTRUCTIVE SLEEP APNEA 10/23/2009   cpap   RBBB 10/26/2009   [place]  Sciatic nerve pain    Sleep apnea    Cpap   SOB (shortness of breath)    Spinal stenosis of lumbar region 08/18/2012   THROMBOCYTOPENIA 09/13/2008   Past Surgical History:  Procedure Laterality Date   BACK SURGERY     CARDIAC CATHETERIZATION     10 yrs ago   COLONOSCOPY  06/27/2016   MAXIMUM ACCESS (MAS)POSTERIOR LUMBAR INTERBODY FUSION (PLIF) 1 LEVEL N/A 05/27/2013   Procedure: FOR MAXIMUM ACCESS (MAS) POSTERIOR LUMBAR INTERBODY FUSION (PLIF) 1 LEVEL ,LUMBAR FOUR-FIVE;  Surgeon: Eustace Mckeel, MD;  Location: [facility];  Service: Neurosurgery;  Laterality: N/A;  FOR MAXIMUM ACCESS (MAS) POSTERIOR LUMBAR INTERBODY FUSION (PLIF) 1 LEVEL ,LUMBAR FOUR-FIVE   NASAL POLYP EXCISION     TONSILLECTOMY     Family History  Problem Relation Age of  Onset   Heart disease Mother 25       CHF   Heart disease Father    COPD Father    Colon polyps Father    Alcoholism Father    Hypertension Sister    Hyperlipidemia Sister    Stomach cancer Neg Hx    Colon cancer Neg Hx    Esophageal cancer Neg Hx    Ulcerative colitis Neg Hx    Social History   Socioeconomic History   Marital status: Married    Spouse name: Dameian Lozo   Number of children: 1   Years of education: 16    Highest education level: Bachelor's degree (e.g., BA, AB, BS)  Occupational History   Occupation: retired  Tobacco Use   Smoking status: Never   Smokeless tobacco: Never  Vaping Use   Vaping Use: Never used  Substance and Sexual Activity   Alcohol use: Yes    Alcohol/week: 1.0 standard drink of alcohol    Types: 1 Glasses of wine per week    Comment: 4-5 times a week   Drug use: No   Sexual activity: Not Currently  Other Topics Concern   Not on file  Social History Narrative   Lives with wife in [place] home. Son lives in [place], [place], 1 grandson.    Animal-lover, especially dogs.   Exercises routinely at [facility], enjoys yoga   Social Determinants of Health   Financial Resource Strain: Low Risk  (07/03/2022)   Overall Financial Resource Strain (CARDIA)    Difficulty of Paying Living Expenses: Not hard at all  Food Insecurity: No Food Insecurity (07/03/2022)   Hunger Vital Sign    Worried About Running Out of Food in the Last Year: Never true    Ran Out of Food in the Last Year: Never true  Transportation Needs: No Transportation Needs (07/03/2022)   PRAPARE - Hydrologist (Medical): No    Lack of Transportation (Non-Medical): No  Physical Activity: Sufficiently Active (07/03/2022)   Exercise Vital Sign    Days of Exercise per Week: 4 days    Minutes of Exercise per Session: 60 min  Stress: No Stress Concern Present (07/03/2022)   [place]    Feeling  of Stress : Not at all  Social Connections: [place] (07/03/2022)   Social Connection and Isolation Panel [NHANES]    Frequency of Communication with Friends and Family: More than three times a week    Frequency of Social Gatherings with Friends and Family: More than three times a week    Attends Religious Services: More than 4 times  per year    Active Member of Clubs or Organizations: Yes    Attends Archivist Meetings: More than 4 times per year    Marital Status: Married    Tobacco Counseling Counseling given: Not Answered   Clinical Intake:  Pre-visit preparation completed: No  Pain : No/denies pain     BMI - recorded: 29.85 Nutritional Risks: None Diabetes: No  How often do you need to have someone help you when you read instructions, pamphlets, or other written materials from your doctor or pharmacy?: 1 - Never  Diabetic?  No  Interpreter Needed?: No  Information entered by :: Rolene Arbour LPN   Activities of Daily Living    07/03/2022   12:53 PM  In your present state of health, do you have any difficulty performing the following activities:  Hearing? 1  Comment Wears hearing aids  Vision? 0  Difficulty concentrating or making decisions? 0  Walking or climbing stairs? 0  Dressing or bathing? 0  Doing errands, shopping? 0  Preparing Food and eating ? N  Using the Toilet? N  In the past six months, have you accidently leaked urine? N  Do you have problems with loss of bowel control? N  Managing your Medications? N  Managing your Finances? N  Housekeeping or managing your Housekeeping? N    Patient Care Team: [name], MD as PCP - General Tamala Julian Lynnell Dike, MD (Inactive) as PCP - Cardiology (Cardiology) Marica Otter, OD (Optometry) [place], [place], [facility] (Pharmacist)  Indicate any recent Medical Services you may have received from other than [facility] providers in the past year (date may be approximate).     Assessment:    This is a routine wellness examination for Yasmani.  Hearing/Vision screen Hearing Screening - Comments:: Wears hearing aids Vision Screening - Comments:: Wears rx glasses - up to date with routine eye exams with  Marica Otter  Dietary issues and exercise activities discussed: Current Exercise Habits: Home exercise routine, Type of exercise: walking, Time (Minutes): 60, Frequency (Times/Week): 4, Weekly Exercise (Minutes/Week): 240, Intensity: Moderate, Exercise limited by: None identified   Goals Addressed               This Visit's Progress     Increase physical activity (pt-stated)        I would like to lose about 10-15 pounds.       Depression Screen    07/03/2022   12:52 PM 06/05/2021    3:02 PM 04/30/2021    2:58 PM 12/28/2020    3:40 PM 08/07/2020    1:43 PM 06/28/2020   10:06 AM 06/26/2020    9:44 AM  PHQ 2/9 Scores  PHQ - 2 Score 0 0 2 0 0 0 0  PHQ- 9 Score   5   0 1    Fall Risk    07/03/2022   12:54 PM 05/30/2022   10:25 AM 06/05/2021    3:02 PM 06/04/2021    5:01 PM 04/30/2021    2:58 PM  [place] in the past year? 0 0 0 0 0  Number falls in past yr: 0   0   Injury with Fall? 0   0   Risk for fall due to : No Fall Risks  Medication side effect    Follow up Falls prevention discussed  Falls evaluation completed;Education provided;Falls prevention discussed      FALL RISK PREVENTION PERTAINING TO THE HOME:  Any stairs in  or around the home? No  If so, are there any without handrails? No  Home free of loose throw rugs in walkways, pet beds, electrical cords, etc? Yes  Adequate lighting in your home to reduce risk of falls? Yes   ASSISTIVE DEVICES UTILIZED TO PREVENT FALLS:  Life alert? No  Use of a cane, walker or w/c? No  Grab bars in the bathroom? No  Shower chair or bench in shower? Yes Elevated toilet seat or a handicapped toilet? No   TIMED UP AND GO:  Was the test performed? Yes .  Length of time to ambulate 10 feet: 10 sec.    Gait steady and fast without use of assistive device  Cognitive Function:        07/03/2022   12:56 PM 06/05/2021    3:05 PM 06/07/2019   11:50 AM  6CIT Screen  What Year? 0 points 0 points   What month? 0 points 0 points 0 points  What time? 0 points 0 points 0 points  Count back from 20 0 points 0 points 0 points  Months in reverse 0 points 0 points 0 points  Repeat phrase 0 points 2 points 0 points  Total Score 0 points 2 points     Immunizations Immunization History  Administered Date(s) Administered   Fluad Quad(high Dose 65+) 01/30/2022   Influenza Split 03/18/2012, 01/12/2020   Influenza Whole 03/13/2010   Influenza, High Dose Seasonal PF 04/05/2013, 03/08/2015, 03/13/2017, 01/16/2018, 03/04/2019, 02/05/2021   Influenza,inj,Quad PF,6+ Mos 01/28/2014   PFIZER Comirnaty(Gray Top)Covid-19 Tri-Sucrose Vaccine 09/01/2020   PFIZER(Purple Top)SARS-COV-2 Vaccination 06/02/2019, 06/23/2019, 01/31/2020   Pneumococcal Conjugate-13 01/28/2014   Pneumococcal Polysaccharide-23 11/19/2010   Td 09/20/2009   Tdap 01/11/2013   Unspecified SARS-COV-2 Vaccination 02/20/2022   Zoster Recombinat (Shingrix) 11/03/2018, 01/09/2019   Zoster, Live 01/01/2013    TDAP status: Up to date  Flu Vaccine status: Up to date  Pneumococcal vaccine status: Up to date  Covid-19 vaccine status: Completed vaccines  Qualifies for Shingles Vaccine? Yes   Zostavax completed Yes   Shingrix Completed?: Yes  Screening Tests Health Maintenance  Topic Date Due   COVID-19 Vaccine (7 - 2023-24 season) 07/19/2022 (Originally 04/17/2022)   Hepatitis C Screening  07/19/2043 (Originally 01/22/1964)   COLONOSCOPY (Pts 45-66yr Insurance coverage will need to be confirmed)  07/12/2022   DTaP/Tdap/Td (3 - Td or Tdap) 01/12/2023   Medicare Annual Wellness (AWV)  07/04/2023   Pneumonia Vaccine 78 Years old  Completed   INFLUENZA VACCINE  Completed   Zoster Vaccines- Shingrix  Completed   HPV VACCINES  Aged  Out    Health Maintenance  There are no preventive care reminders to display for this patient.   Colorectal cancer screening: Type of screening: Colonoscopy. Completed 07/12/19. Repeat every 3 years  Lung Cancer Screening: (Low Dose CT Chest recommended if Age 78-80years, 30 pack-year currently smoking OR have quit w/in 15years.) does not qualify.     Additional Screening:    Vision Screening: Recommended annual ophthalmology exams for early detection of glaucoma and other disorders of the eye. Is the patient up to date with their annual eye exam?  Yes  Who is the provider or what is the name of the office in which the patient attends annual eye exams? Dr SMarica OtterIf pt is not established with a provider, would they like to be referred to a provider to establish care? No .   Dental Screening: Recommended annual dental exams for proper oral hygiene  Community Resource Referral / Chronic Care Management:  CRR required this visit?  No   CCM required this visit?  No      Plan:     I have personally reviewed and noted the following in the patient's chart:   Medical and social history Use of alcohol, tobacco or illicit drugs  Current medications and supplements including opioid prescriptions. Patient is not currently taking opioid prescriptions. Functional ability and status Nutritional status Physical activity Advanced directives List of other physicians Hospitalizations, surgeries, and ER visits in previous 12 months Vitals Screenings to include cognitive, depression, and falls Referrals and appointments  In addition, I have reviewed and discussed with patient certain preventive protocols, quality metrics, and best practice recommendations. A written personalized care plan for preventive services as well as general preventive health recommendations were provided to patient.     Criselda Peaches, LPN   [id number]   Nurse Notes: None

## 2022-08-10 ENCOUNTER — Other Ambulatory Visit: Payer: Self-pay | Admitting: Family Medicine

## 2022-09-04 ENCOUNTER — Encounter: Payer: Self-pay | Admitting: Family Medicine

## 2022-09-04 ENCOUNTER — Ambulatory Visit (INDEPENDENT_AMBULATORY_CARE_PROVIDER_SITE_OTHER): Payer: PPO | Admitting: Family Medicine

## 2022-09-04 VITALS — BP 110/76 | HR 62 | Temp 98.0°F | Wt 199.0 lb

## 2022-09-04 DIAGNOSIS — N390 Urinary tract infection, site not specified: Secondary | ICD-10-CM

## 2022-09-04 LAB — POC URINALSYSI DIPSTICK (AUTOMATED)
Bilirubin, UA: NEGATIVE
Blood, UA: NEGATIVE
Glucose, UA: NEGATIVE
Ketones, UA: NEGATIVE
Leukocytes, UA: NEGATIVE
Nitrite, UA: NEGATIVE
Protein, UA: POSITIVE — AB
Spec Grav, UA: 1.015 (ref 1.010–1.025)
Urobilinogen, UA: 1 E.U./dL
pH, UA: 5.5 (ref 5.0–8.0)

## 2022-09-04 MED ORDER — TAMSULOSIN HCL 0.4 MG PO CAPS: 0.4000 mg | ORAL_CAPSULE | Freq: Every day | ORAL | 0 refills | Status: DC

## 2022-09-04 MED ORDER — CIPROFLOXACIN HCL 500 MG PO TABS: 500.0000 mg | ORAL_TABLET | Freq: Two times a day (BID) | ORAL | 0 refills | Status: AC

## 2022-09-04 NOTE — Addendum Note (Signed)
Addended by: Carola Rhine on: 09/04/2022 03:54 PM   Modules accepted: Orders

## 2022-09-04 NOTE — Progress Notes (Signed)
   Subjective:    Patient ID: Donald Pope, male    DOB: Sep 23, 1945, 77 y.o.   MRN: [id number]  HPI Here for 3 days of urgency to urinate and burning when urinating. No fever or back pain. He has never had a UTI before. He is drinking lots of water. He also mentions some difficulty passing his urine the past few months. He often has to run the bathroom faucet and try to relax before he can urinate. He saw Dr. Normajean Baxter in Urology about 2 years ago. He is unable to give [place] a urine sample today.    Review of Systems  Constitutional: Negative.   Respiratory: Negative.    Cardiovascular: Negative.   Gastrointestinal: Negative.   Genitourinary:  Positive for difficulty urinating, dysuria, frequency and urgency. Negative for flank pain, hematuria and testicular pain.       Objective:   Physical Exam Constitutional:      Appearance: Normal appearance. He is not ill-appearing.  Cardiovascular:     Rate and Rhythm: Normal rate and regular rhythm.     Pulses: Normal pulses.  Pulmonary:     Effort: Pulmonary effort is normal.     Breath sounds: Normal breath sounds.  Abdominal:     Tenderness: There is no right CVA tenderness or left CVA tenderness.  Neurological:     Mental Status: He is alert.           Assessment & Plan:  He likely has a UTI, so we will treat this with 10 days of Cipro. For the difficulty passing urine, he will try Flomax 0.4 mg daily.  Gershon Crane, MD

## 2022-09-04 NOTE — Addendum Note (Signed)
Addended by: Carola Rhine on: 09/04/2022 04:41 PM   Modules accepted: Orders

## 2022-09-06 ENCOUNTER — Other Ambulatory Visit: Payer: Self-pay | Admitting: Family Medicine

## 2022-09-06 LAB — URINE CULTURE
MICRO NUMBER:: 14868124
SPECIMEN QUALITY:: ADEQUATE

## 2022-10-15 DIAGNOSIS — Z981 Arthrodesis status: Secondary | ICD-10-CM | POA: Diagnosis not present

## 2022-10-15 DIAGNOSIS — Z4789 Encounter for other orthopedic aftercare: Secondary | ICD-10-CM | POA: Diagnosis not present

## 2022-10-22 ENCOUNTER — Encounter: Payer: Self-pay | Admitting: Internal Medicine

## 2022-11-05 ENCOUNTER — Telehealth: Payer: Self-pay | Admitting: Family Medicine

## 2022-11-05 ENCOUNTER — Encounter: Payer: Self-pay | Admitting: Internal Medicine

## 2022-11-05 DIAGNOSIS — I2510 Atherosclerotic heart disease of native coronary artery without angina pectoris: Secondary | ICD-10-CM

## 2022-11-05 NOTE — Telephone Encounter (Signed)
Patient aware.

## 2022-11-05 NOTE — Addendum Note (Signed)
Addended by: Christy Sartorius on: 11/05/2022 01:29 PM   Modules accepted: Orders

## 2022-11-05 NOTE — Telephone Encounter (Signed)
Referral placed and left message for patient to return my call.

## 2022-11-05 NOTE — Telephone Encounter (Signed)
Requesting a referral to [facility] cardiologist, says his previous has retired.

## 2022-11-13 ENCOUNTER — Other Ambulatory Visit: Payer: Self-pay

## 2022-11-13 ENCOUNTER — Emergency Department (HOSPITAL_BASED_OUTPATIENT_CLINIC_OR_DEPARTMENT_OTHER): Payer: PPO | Admitting: Radiology

## 2022-11-13 ENCOUNTER — Emergency Department (HOSPITAL_BASED_OUTPATIENT_CLINIC_OR_DEPARTMENT_OTHER)
Admission: EM | Admit: 2022-11-13 | Discharge: 2022-11-13 | Disposition: A | Discharge status: Home / Self Care | Payer: PPO | Attending: Emergency Medicine | Admitting: Emergency Medicine

## 2022-11-13 ENCOUNTER — Encounter (HOSPITAL_BASED_OUTPATIENT_CLINIC_OR_DEPARTMENT_OTHER): Payer: Self-pay

## 2022-11-13 DIAGNOSIS — R55 Syncope and collapse: Secondary | ICD-10-CM | POA: Insufficient documentation

## 2022-11-13 DIAGNOSIS — I2510 Atherosclerotic heart disease of native coronary artery without angina pectoris: Secondary | ICD-10-CM | POA: Diagnosis not present

## 2022-11-13 LAB — URINALYSIS, ROUTINE W REFLEX MICROSCOPIC
Bilirubin Urine: NEGATIVE
Glucose, UA: NEGATIVE mg/dL
Hgb urine dipstick: NEGATIVE
Ketones, ur: NEGATIVE mg/dL
Leukocytes,Ua: NEGATIVE
Nitrite: NEGATIVE
Protein, ur: NEGATIVE mg/dL
Specific Gravity, Urine: 1.011 (ref 1.005–1.030)
pH: 6.5 (ref 5.0–8.0)

## 2022-11-13 LAB — BASIC METABOLIC PANEL
Anion gap: 11 (ref 5–15)
BUN: 20 mg/dL (ref 8–23)
CO2: 22 mmol/L (ref 22–32)
Calcium: 9.9 mg/dL (ref 8.9–10.3)
Chloride: 104 mmol/L (ref 98–111)
Creatinine, Ser: 1.07 mg/dL (ref 0.61–1.24)
GFR, Estimated: >60 mL/min (ref >60)
Glucose, Bld: 114 mg/dL — ABNORMAL HIGH (ref 70–99)
Potassium: 4.2 mmol/L (ref 3.5–5.1)
Sodium: 137 mmol/L (ref 135–145)

## 2022-11-13 LAB — CBC
HCT: 40.5 % (ref 39.0–52.0)
Hemoglobin: 13.6 g/dL (ref 13.0–17.0)
MCH: 30.1 pg (ref 26.0–34.0)
MCHC: 33.6 g/dL (ref 30.0–36.0)
MCV: 89.6 fL (ref 80.0–100.0)
Platelets: 113 10*3/uL — ABNORMAL LOW (ref 150–400)
RBC: 4.52 MIL/uL (ref 4.22–5.81)
RDW: 13.7 % (ref 11.5–15.5)
WBC: 9 10*3/uL (ref 4.0–10.5)
nRBC: 0 % (ref 0.0–0.2)

## 2022-11-13 LAB — CBG MONITORING, ED: Glucose-Capillary: 107 mg/dL — ABNORMAL HIGH (ref 70–99)

## 2022-11-13 LAB — TROPONIN I (HIGH SENSITIVITY)
Troponin I (High Sensitivity): 5 ng/L (ref <18)
Troponin I (High Sensitivity): 7 ng/L (ref <18)

## 2022-11-13 MED ORDER — SODIUM CHLORIDE 0.9 % IV BOLUS
500.0000 mL | Freq: Once | INTRAVENOUS | Status: AC
  Administered 2022-11-13: 500 mL via INTRAVENOUS

## 2022-11-13 NOTE — ED Triage Notes (Signed)
Patient here POV from Home.  Endorses playing Pickleball today when he had a short syncopal episode lasting 5-10 seconds. Assessed on scene by EMS.   No Known Head injury. No SOB. No Pain. No Anticoagulants.   NAD noted during Triage. A&Ox4. Gcs 15. BIB Wheelchair.

## 2022-11-13 NOTE — ED Provider Notes (Signed)
[place] EMERGENCY DEPARTMENT AT [facility] Provider Note   CSN: [id number] Arrival date & time: 11/13/22  1545     History Chief Complaint  Patient presents with   Loss of Consciousness     Loss of Consciousness Associated symptoms: dizziness   Associated symptoms: no chest pain, no fever, no headaches, no palpitations, no seizures, no shortness of breath and no weakness    Donald Pope is a 77 y.o. male presenting for loss of conciousness while he was playing picket ball. He felt very dizzy, the room was spinning and then collapsed. Did not hit his head. [facility] and it was elevated. He states that he had similar episode years ago and did not get it evaluated. RBBB on EKG. No BM, tongue biting.     Past Medical History:  Diagnosis Date   Allergy    Anxiety    Back pain    Benign prostatic hypertrophy    possible   Bilateral leg cramps    CAD, NATIVE VESSEL 10/23/2009   Chest pain    DEPRESSION 09/13/2008   Depression    False positive stress test    history of, suggestive of inferior ischemia   GERD 09/13/2008   History of GI bleed    HYPERLIPIDEMIA 09/13/2008   Meniere's disease    OBSTRUCTIVE SLEEP APNEA 10/23/2009   cpap   RBBB 10/26/2009   [place]   Sciatic nerve pain    Sleep apnea    Cpap   SOB (shortness of breath)    Spinal stenosis of lumbar region 08/18/2012   THROMBOCYTOPENIA 09/13/2008   Patient's recorded medical, surgical, social, medication list and allergies were reviewed in the Snapshot window as part of the initial history.   Review of Systems   Review of Systems  Constitutional:  Negative for chills, fatigue and fever.  Respiratory:  Negative for cough and shortness of breath.   Cardiovascular:  Positive for syncope. Negative for chest pain and palpitations.  Gastrointestinal:  Negative for abdominal pain.  Genitourinary:  Negative for difficulty urinating, dysuria and frequency.  Skin:  Negative for  wound.  Neurological:  Positive for dizziness and syncope. Negative for tremors, seizures, facial asymmetry, speech difficulty, weakness and headaches.    Physical Exam Updated Vital Signs BP (!) 153/85   Pulse 65   Temp 97.9 F (36.6 C) (Oral)   Resp 17   Ht 5\' 8"  (1.727 m)   Wt 83.9 kg   SpO2 99%   BMI 28.13 kg/m  Physical Exam Constitutional:      General: He is not in acute distress. HENT:     Mouth/Throat:     Mouth: Mucous membranes are dry.  Eyes:     General: No visual field deficit. Cardiovascular:     Rate and Rhythm: Normal rate.     Heart sounds: S1 normal and S2 normal. No murmur heard.    No friction rub. No gallop.  Abdominal:     General: Bowel sounds are normal. There is no distension.     Tenderness: There is no abdominal tenderness.  Musculoskeletal:        General: No swelling.  Neurological:     General: No focal deficit present.     Mental Status: He is alert and oriented to person, place, and time.     Cranial Nerves: Cranial nerves 2-12 are intact. No dysarthria or facial asymmetry.     Motor: No pronator drift.  Coordination: Coordination normal. Finger-Nose-Finger Test and Heel to [facility] Test normal.     Gait: Gait normal.     Comments:    Psychiatric:        Mood and Affect: Mood normal.     ED Course/ Medical Decision Making/ A&P Clinical Course as of 11/13/22 1948  Wed Nov 13, 2022  1556 CBC [CA]    Clinical Course User Index [CA] Manuela Neptune, MD    Medications Ordered in ED Medications  sodium chloride 0.9 % bolus 500 mL (0 mLs Intravenous Stopped 11/13/22 1930)    Medical Decision Making:    Donald Pope is a 77 y.o. male who presented to the ED today for evaluation of a LOC that he had while playing picketball. He did not hit his head. Lasted a couple seconds  Additional history discussed with patient's family/caregivers.   Complete initial physical exam performed, notably the patient had no focal  deficits, EKG did not show any acute findings.    Reviewed and confirmed nursing documentation for past medical history, family history, social history.    Initial Assessment:   With the patient's presentation of a fall, most likely diagnosis is syncope. Other diagnoses were considered including (but not limited to) seizure, stroke. These are considered less likely due to history of present illness and physical exam findings.   This is most consistent with an acute complicated illness  Initial Plan:  Screening labs including CBC and Metabolic panel to evaluate for infectious or metabolic etiology of disease.  Urinalysis with reflex culture ordered to evaluate for UTI or relevant urologic/nephrologic pathology.  CXR to evaluate for structural/infectious intrathoracic pathology.  EKG to evaluate for cardiac pathology. Objective evaluation as below reviewed with plan for close reassessment  Initial Study Results:   Laboratory  All laboratory results reviewed without evidence of clinically relevant pathology.    Results for orders placed or performed during the hospital encounter of 11/13/22  Basic metabolic panel  Result Value Ref Range   Sodium 137 135 - 145 mmol/L   Potassium 4.2 3.5 - 5.1 mmol/L   Chloride 104 98 - 111 mmol/L   CO2 22 22 - 32 mmol/L   Glucose, Bld 114 (H) 70 - 99 mg/dL   BUN 20 8 - 23 mg/dL   Creatinine, Ser 4.09 0.61 - 1.24 mg/dL   Calcium 9.9 8.9 - 81.1 mg/dL   GFR, Estimated >91 >47 mL/min   Anion gap 11 5 - 15  CBC  Result Value Ref Range   WBC 9.0 4.0 - 10.5 K/uL   RBC 4.52 4.22 - 5.81 MIL/uL   Hemoglobin 13.6 13.0 - 17.0 g/dL   HCT 82.9 56.2 - 13.0 %   MCV 89.6 80.0 - 100.0 fL   MCH 30.1 26.0 - 34.0 pg   MCHC 33.6 30.0 - 36.0 g/dL   RDW 86.5 78.4 - 69.6 %   Platelets 113 (L) 150 - 400 K/uL   nRBC 0.0 0.0 - 0.2 %  Urinalysis, Routine w reflex microscopic -Urine, Clean Catch  Result Value Ref Range   Color, Urine COLORLESS (A) YELLOW   APPearance  CLEAR CLEAR   Specific Gravity, Urine 1.011 1.005 - 1.030   pH 6.5 5.0 - 8.0   Glucose, UA NEGATIVE NEGATIVE mg/dL   Hgb urine dipstick NEGATIVE NEGATIVE   Bilirubin Urine NEGATIVE NEGATIVE   Ketones, ur NEGATIVE NEGATIVE mg/dL   Protein, ur NEGATIVE NEGATIVE mg/dL   Nitrite NEGATIVE NEGATIVE   Leukocytes,Ua NEGATIVE NEGATIVE  CBG monitoring, ED  Result Value Ref Range   Glucose-Capillary 107 (H) 70 - 99 mg/dL  Troponin I (High Sensitivity)  Result Value Ref Range   Troponin I (High Sensitivity) 5 <18 ng/L  Troponin I (High Sensitivity)  Result Value Ref Range   Troponin I (High Sensitivity) 7 <18 ng/L   EKG EKG was reviewed independently. Rate, rhythm, axis, intervals all examined and without medically relevant abnormality. ST segments without concerns for elevations.    Radiology  All images reviewed independently. Agree with radiology report at this time.   DG Chest 2 View  Result Date: 11/13/2022 CLINICAL DATA:  Syncope. EXAM: CHEST - 2 VIEW COMPARISON:  05/17/2013 FINDINGS: The lungs are clear without focal pneumonia, edema, pneumothorax or pleural effusion. The cardiopericardial silhouette is within normal limits for size. No acute bony abnormality. Telemetry leads overlie the chest. IMPRESSION: No active cardiopulmonary disease. Electronically Signed   By: Kennith Center M.D.   On: 11/13/2022 17:30     Final Assessment and Plan:    Mr. Feaser is a 77 yo male he presented with a fall, trops WNL, EKG showed no acute concerns, negative pronator drift, no focal deficits noted. He was overexerting while playing picket ball. Felt better with fluids. Dizzy upon standing. Likely orthostatic.   #Syncope -S/p NS bolus  -FU with PCP within a week.  Clinical Impression:  1. Syncope, unspecified syncope type      Discharge   Final Clinical Impression(s) / ED Diagnoses Final diagnoses:  Syncope, unspecified syncope type    Rx / DC Orders ED Discharge Orders     None          Hassan Rowan, [place], MD 11/13/22 1948    Tegeler, Canary Brim, MD 11/15/22 [phone]

## 2022-11-15 ENCOUNTER — Telehealth: Payer: Self-pay

## 2022-11-15 NOTE — Telephone Encounter (Signed)
Burchette, Elberta Fortis, MD  P Burchette [facility] Make sure this patient gets 30 minute ER follow up within next week or so to reassess.     Appt scheduled for 11/25/2022

## 2022-11-18 ENCOUNTER — Other Ambulatory Visit: Payer: Self-pay | Admitting: Family Medicine

## 2022-11-20 ENCOUNTER — Ambulatory Visit: Payer: PPO | Admitting: Cardiology

## 2022-11-20 ENCOUNTER — Encounter: Payer: Self-pay | Admitting: Cardiology

## 2022-11-20 VITALS — BP 136/79 | HR 72 | Resp 16 | Ht 68.0 in | Wt 197.4 lb

## 2022-11-20 DIAGNOSIS — D693 Immune thrombocytopenic purpura: Secondary | ICD-10-CM

## 2022-11-20 DIAGNOSIS — I10 Essential (primary) hypertension: Secondary | ICD-10-CM

## 2022-11-20 DIAGNOSIS — R55 Syncope and collapse: Secondary | ICD-10-CM

## 2022-11-20 DIAGNOSIS — E782 Mixed hyperlipidemia: Secondary | ICD-10-CM

## 2022-11-20 MED ORDER — LOSARTAN POTASSIUM 25 MG PO TABS
25.0000 mg | ORAL_TABLET | Freq: Every day | ORAL | 2 refills | Status: DC
Start: 2022-11-20 — End: 2023-01-06

## 2022-11-20 MED ORDER — ROSUVASTATIN CALCIUM 10 MG PO TABS: 10.0000 mg | ORAL_TABLET | Freq: Every day | ORAL | 3 refills | Status: DC

## 2022-11-20 NOTE — Progress Notes (Signed)
Primary Physician/Referring:  Kristian Covey, MD  Patient ID: Donald Pope, male    DOB: 05/16/1945, 77 y.o.   MRN: [id number]  Chief Complaint  Patient presents with   Atherosclerosis of native coronary artery of native heart w   New Patient (Initial Visit)    Referred by Dr. Evelena Peat   HPI:    Donald Pope  is a 77 y.o. 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 Verdis Prime (cardiology) retired and also patient presented with syncope on 11/13/2022 to the emergency room after he was playing pickle ball, got lightheaded and then had syncope.  He was discharged home with recommendation to follow-up in the outpatient basis after fluid resuscitation.  He continues to remain very active, states that he has had similar episode 2 years ago.  Presently has occasional episodes of dizziness when he suddenly stands up and is wondering whether his Mnire's disease has returned.  Otherwise remains asymptomatic.  Past Medical History:  Diagnosis Date   Allergy    Anxiety    Back pain    Benign prostatic hypertrophy    possible   Bilateral leg cramps    CAD, NATIVE VESSEL 10/23/2009   Chest pain    DEPRESSION 09/13/2008   Depression    False positive stress test    history of, suggestive of inferior ischemia   GERD 09/13/2008   History of GI bleed    HYPERLIPIDEMIA 09/13/2008   Meniere's disease    OBSTRUCTIVE SLEEP APNEA 10/23/2009   cpap   RBBB 10/26/2009   [place]   Sciatic nerve pain    Sleep apnea    Cpap   SOB (shortness of breath)    Spinal stenosis of lumbar region 08/18/2012   THROMBOCYTOPENIA 09/13/2008   Past Surgical History:  Procedure Laterality Date   BACK SURGERY     CARDIAC CATHETERIZATION     10 yrs ago   COLONOSCOPY  06/27/2016   MAXIMUM ACCESS (MAS)POSTERIOR LUMBAR INTERBODY FUSION (PLIF) 1 LEVEL N/A  05/27/2013   Procedure: FOR MAXIMUM ACCESS (MAS) POSTERIOR LUMBAR INTERBODY FUSION (PLIF) 1 LEVEL ,LUMBAR FOUR-FIVE;  Surgeon: Tia Alert, MD;  Location: [facility];  Service: Neurosurgery;  Laterality: N/A;  FOR MAXIMUM ACCESS (MAS) POSTERIOR LUMBAR INTERBODY FUSION (PLIF) 1 LEVEL ,LUMBAR FOUR-FIVE   NASAL POLYP EXCISION     TONSILLECTOMY     Family History  Problem Relation Age of Onset   Heart disease Mother 59       CHF   Heart disease Father    COPD Father    Colon polyps Father    Alcoholism Father    Hypertension Sister    Hyperlipidemia Sister    Stomach cancer Neg Hx    Colon cancer Neg Hx    Esophageal cancer Neg Hx    Ulcerative colitis Neg Hx     Social History   Tobacco Use   Smoking status: Never   Smokeless tobacco: Never  Substance Use Topics   Alcohol use: Yes    Alcohol/week: 1.0 standard drink of alcohol    Types: 1 Glasses of wine per week    Comment: 4-5 times a week   Marital Status: Married  ROS  Review of Systems  Cardiovascular:  Negative for chest pain, dyspnea on exertion and leg swelling.  Neurological:  Positive for dizziness.   Objective  11/20/2022    1:51 PM 11/13/2022    7:30 PM 11/13/2022    6:00 PM  Vitals with BMI  Height 5\' 8"     Weight 197 lbs 6 oz    BMI 30.02    Systolic 136 153 161  Diastolic 79 85 90  Pulse 72 65 80   Blood pressure 136/79, pulse 72, resp. rate 16, height 5\' 8"  (1.727 m), weight 197 lb 6.4 oz (89.5 kg), SpO2 97 %.  Orthostatic VS for the past 72 hrs (Last 3 readings):  Orthostatic BP Patient Position BP Location Cuff Size Orthostatic Pulse  11/20/22 1507 145/89 Standing Left Arm Large 62  11/20/22 1506 144/85 Sitting Left Arm Large 59  11/20/22 1505 139/84 Supine Left Arm Large 59    Physical Exam Neck:     Vascular: No carotid bruit or JVD.  Cardiovascular:     Rate and Rhythm: Normal rate and regular rhythm.     Pulses: Intact distal pulses.     Heart sounds: Normal heart sounds. No  murmur heard.    No gallop.  Pulmonary:     Effort: Pulmonary effort is normal.     Breath sounds: Normal breath sounds.  Abdominal:     General: Bowel sounds are normal.     Palpations: Abdomen is soft.  Musculoskeletal:     Right lower leg: No edema.     Left lower leg: No edema.    Laboratory examination:   Recent Labs    01/30/22 0914 11/13/22 1554  NA 137 137  K 4.2 4.2  CL 101 104  CO2 27 22  GLUCOSE 106* 114*  BUN 21 20  CREATININE 1.18 1.07  CALCIUM 9.5 9.9  GFRNONAA  --  >60    Lab Results  Component Value Date   GLUCOSE 114 (H) 11/13/2022   NA 137 11/13/2022   K 4.2 11/13/2022   CL 104 11/13/2022   CO2 22 11/13/2022   BUN 20 11/13/2022   CREATININE 1.07 11/13/2022   GFRNONAA >60 11/13/2022   CALCIUM 9.9 11/13/2022   PROT 7.5 01/30/2022   ALBUMIN 4.4 01/30/2022   LABGLOB 2.6 03/09/2020   AGRATIO 1.7 03/09/2020   BILITOT 0.5 01/30/2022   ALKPHOS 49 01/30/2022   AST 24 01/30/2022   ALT 16 01/30/2022   ANIONGAP 11 11/13/2022      Lab Results  Component Value Date   ALT 16 01/30/2022   AST 24 01/30/2022   ALKPHOS 49 01/30/2022   BILITOT 0.5 01/30/2022       Latest Ref Rng & Units 11/13/2022    3:54 PM 01/30/2022    9:14 AM 05/16/2021   11:41 AM  CBC  WBC 4.0 - 10.5 K/uL 9.0  5.5  6.5   Hemoglobin 13.0 - 17.0 g/dL 09.6  04.5  9.8   Hematocrit 39.0 - 52.0 % 40.5  40.5  30.5   Platelets 150 - 400 K/uL 113  121.0  148.0        Latest Ref Rng & Units 01/30/2022    9:14 AM 04/30/2021    3:31 PM 04/23/2021    1:45 AM  Hepatic Function  Total Protein 6.0 - 8.3 g/dL 7.5  7.0  5.5   Albumin 3.5 - 5.2 g/dL 4.4  4.3  3.4   AST 0 - 37 U/L 24  22  17    ALT 0 - 53 U/L 16  14  13    Alk Phosphatase 39 - 117 U/L 49  56  33   Total Bilirubin 0.2 - 1.2 mg/dL 0.5  0.3  0.7   Bilirubin, Direct 0.0 - 0.3 mg/dL 0.1      Lipid Panel Recent Labs    01/30/22 0914  CHOL 186  TRIG 335.0*  VLDL 67.0*  HDL 33.10*  CHOLHDL 6  LDLDIRECT 129.0   Last  vitamin D Lab Results  Component Value Date   VD25OH 56.7 06/21/2020   HEMOGLOBIN A1C Lab Results  Component Value Date   HGBA1C 5.8 (H) 06/21/2020   TSH Recent Labs    01/30/22 0914  TSH 4.28   Radiology:   Chest x-ray two-view 11/13/2022: The lungs are clear without focal pneumonia, edema, pneumothorax or pleural effusion. The cardiopericardial silhouette is within normal limits for size. No acute bony abnormality. Telemetry leads overlie the chest. Cardiac Studies:   Coronary angiogram 09/24/2005:  1. LV:  119/4/9.  EF 65% without regional wall motion abnormality.  2. No aortic stenosis or mitral regurgitation.  3. Left main:  Angiographically normal.  4. LAD:  Moderate-sized vessel giving rise to two small diagonals.  The      proximal LAD has a 20% stenosis.  A septal has an 80% stenosis at its      ostium.  The mid LAD has a focal 30% stenosis.  5. Ramus intermedius:  Fairly small vessel which is angiographically normal.  6. Circumflex:  Small vessel with 30% stenosis in the mid vessel.  7. RCA:  Large, dominant vessel.  There is a calcified 30% stenosis of the mid vessel.  There is a focal 40% stenosis approximately 20 mm prior to      the origin of the PDA.  EKG:   EKG 11/20/2022: Normal sinus rhythm with rate of 63 bpm, normal axis, right bundle branch block.  No significant change from EKG 11/13/2022, heart rate was 82 bpm.   Medications and allergies   Allergies  Allergen Reactions   Adhesive [Tape] Rash   Codeine Sulfate Other (See Comments)    urination problems   Latex Rash     Medication list   Current Outpatient Medications:    baclofen (LIORESAL) 10 MG tablet, TAKE 1 TABLET (10 MG TOTAL) BY MOUTH 3 (THREE) TIMES DAILY. AS NEEDED FOR MUSCLE SPASM, Disp: 60 tablet, Rfl: 2   citalopram (CELEXA) 10 MG tablet, TAKE 1 TABLET BY MOUTH EVERY DAY, Disp: 90 tablet, Rfl: 0   Coenzyme Q10 (CO Q 10 PO), Take 1 tablet by mouth daily., Disp: , Rfl:    cyanocobalamin  (VITAMIN B12) 1000 MCG tablet, Take by mouth., Disp: , Rfl:    fluticasone (FLONASE) 50 MCG/ACT nasal spray, Place 2 sprays into both nostrils every other day., Disp: , Rfl:    gabapentin (NEURONTIN) 600 MG tablet, TAKE 2 TABLETS BY MOUTH TWICE A DAY, Disp: 180 tablet, Rfl: 1   losartan (COZAAR) 25 MG tablet, Take 1 tablet (25 mg total) by mouth daily., Disp: 30 tablet, Rfl: 2   melatonin 5 MG TABS, Take 5 mg by mouth at bedtime., Disp: , Rfl:    Multiple Vitamin (MULTI-VITAMIN) tablet, Take 1 tablet by mouth daily., Disp: , Rfl:    omeprazole (PRILOSEC) 20 MG capsule, Take 20 mg by mouth every other day., Disp: , Rfl:    tamsulosin (FLOMAX) 0.4 MG CAPS capsule, Take 1 capsule (0.4 mg total) by mouth daily., Disp: 90 capsule, Rfl: 0   triamcinolone cream (KENALOG) 0.1 %, Apply 1 Application topically 2 (two) times daily., Disp: 30  g, Rfl: 2   rosuvastatin (CRESTOR) 10 MG tablet, Take 1 tablet (10 mg total) by mouth daily., Disp: 90 tablet, Rfl: 3  Assessment     ICD-10-CM   1. Supine hypertension  I10 losartan (COZAAR) 25 MG tablet    Basic metabolic panel    2. Vasovagal syncope  R55 EKG 12-Lead    PCV ECHOCARDIOGRAM COMPLETE    PCV CARDIAC STRESS TEST    3. Mixed hyperlipidemia  E78.2 rosuvastatin (CRESTOR) 10 MG tablet    Lipid Panel With LDL/HDL Ratio    4. Chronic idiopathic thrombocytopenia (HCC)  D69.3        Orders Placed This Encounter  Procedures   Lipid Panel With LDL/HDL Ratio   Basic metabolic panel   PCV CARDIAC STRESS TEST    Standing Status:   Future    Standing Expiration Date:   01/21/2023   EKG 12-Lead   PCV ECHOCARDIOGRAM COMPLETE    Standing Status:   Future    Standing Expiration Date:   11/20/2023    Meds ordered this encounter  Medications   rosuvastatin (CRESTOR) 10 MG tablet    Sig: Take 1 tablet (10 mg total) by mouth daily.    Dispense:  90 tablet    Refill:  3   losartan (COZAAR) 25 MG tablet    Sig: Take 1 tablet (25 mg total) by mouth daily.     Dispense:  30 tablet    Refill:  2    Medications Discontinued During This Encounter  Medication Reason   ascorbic acid (VITAMIN C) 500 MG tablet    rosuvastatin (CRESTOR) 5 MG tablet Reorder     Recommendations:   Donald Pope is a 77 y.o. Fairly active Caucasian male patient with nonobstructive coronary disease by cardiac catheterization in 2007, hypercholesterolemia, OSA on CPAP referred to me to establish care as Dr. Verdis Prime (cardiology) retired and also patient presented with syncope on 11/13/2022 to the emergency room after he was playing pickle ball, got lightheaded and then had syncope.  He was discharged home with recommendation to follow-up in the outpatient basis after fluid resuscitation.  1. Supine hypertension Patient and his wife bring home blood pressure recordings which are clearly suggestive of systolic hypertension and always been greater than 140s/80 mmHg.  A guardian start him on losartan 25 mg in the evening, BMP in 2 to 3 weeks. - losartan (COZAAR) 25 MG tablet; Take 1 tablet (25 mg total) by mouth daily.  Dispense: 30 tablet; Refill: 2 - Basic metabolic panel  2. Vasovagal syncope Patient has had 1 more episode of syncope 2 years ago and in a similar fashion.  Present episode also appears to be vasovagal and do not suspect significant cardiac arrhythmias although therapy completely excluded.  Although patient 77 years of age, relatively healthy and has only very minimal coronary artery disease by catheterization in 2007.  His diagnosis of established CAD probably can be taken out as he does not have >50% stenosis in major vessels.  I will set him up for an echocardiogram and a routine treadmill exercise stress test.  He has chronic right bundle branch block and no new EKG changes.  Counterpressure maneuvers discussed with the patient and fall precautions discussed with the patient and his wife at the bedside. - EKG 12-Lead - PCV ECHOCARDIOGRAM COMPLETE;  Future - PCV CARDIAC STRESS TEST; Future  3. Mixed hyperlipidemia Patient has mixed hypercholesterolemia, markedly elevated triglycerides related to poor diet.  We discussed Diet,  LDL is also not at goal of <100, will increase Crestor to 10 mg daily and repeat lipid profile in 4 to 6 weeks.  - rosuvastatin (CRESTOR) 10 MG tablet; Take 1 tablet (10 mg total) by mouth daily.  Dispense: 90 tablet; Refill: 3 - Lipid Panel With LDL/HDL Ratio  4. Chronic idiopathic thrombocytopenia (HCC) Patient has chronic thrombocytopenia, fortunately I do not think he needs aspirin prophylaxis.  No bleeding diathesis.  He does have hemorrhoids last measured bleeding was about 2 years ago almost requiring blood transfusion.  I like to see him back in 2 months for follow-up.  If he remains stable, he needs continued primary prevention strategy and I will see him back on a as needed basis.  Patient and his wife are appreciative.      Yates Decamp, MD, [facility] 11/20/2022, 3:23 PM Office: [phone]

## 2022-11-21 DIAGNOSIS — G4733 Obstructive sleep apnea (adult) (pediatric): Secondary | ICD-10-CM | POA: Diagnosis not present

## 2022-11-22 DIAGNOSIS — M25561 Pain in right knee: Secondary | ICD-10-CM | POA: Insufficient documentation

## 2022-11-22 DIAGNOSIS — M1711 Unilateral primary osteoarthritis, right knee: Secondary | ICD-10-CM | POA: Diagnosis not present

## 2022-11-25 ENCOUNTER — Encounter: Payer: Self-pay | Admitting: Family Medicine

## 2022-11-25 ENCOUNTER — Ambulatory Visit: Payer: PPO | Admitting: Family Medicine

## 2022-11-25 VITALS — BP 124/70 | HR 80 | Temp 98.6°F | Ht 68.0 in | Wt 191.8 lb

## 2022-11-25 DIAGNOSIS — R55 Syncope and collapse: Secondary | ICD-10-CM

## 2022-11-25 NOTE — Progress Notes (Signed)
Established Patient Office Visit  Subjective   Patient ID: Donald Pope, male    DOB: 1945/09/10  Age: 77 y.o. MRN: [id number]  Chief Complaint  Patient presents with   Hospitalization Follow-up    HPI   Donald Pope is here following recent syncopal episode which occurred at the [facility] playing pickle ball.  He recalls getting up and not having anything to drink that morning.  He does recall feeling slightly lightheaded earlier that morning.  He apparently was down for just couple seconds.  No evidence to suggest any seizure activity.  [facility] systolic range and EMS was called.  There was no focal weakness.  No chest pain.  No confusion.  ER evaluation reviewed.  His EKG showed right bundle branch block which was stable but no acute changes.  He had chest x-ray which was unremarkable.  Labs unremarkable.  It was felt that he was slightly dehydrated.  He was given some IV fluids of normal saline and felt better afterwards.  Subsequently has been seen outpatient by cardiology and has echocardiogram and stress test pending.  No known family history of premature CAD.  No further syncopal episodes.  When he saw cardiology in follow-up on the 10th of this month was placed on low-dose losartan 25 mg daily for mildly elevated blood pressure.  He takes rosuvastatin 10 mg daily for hyperlipidemia.  Past Medical History:  Diagnosis Date   Allergy    Anxiety    Back pain    Benign prostatic hypertrophy    possible   Bilateral leg cramps    CAD, NATIVE VESSEL 10/23/2009   Chest pain    DEPRESSION 09/13/2008   Depression    False positive stress test    history of, suggestive of inferior ischemia   GERD 09/13/2008   History of GI bleed    HYPERLIPIDEMIA 09/13/2008   Meniere's disease    OBSTRUCTIVE SLEEP APNEA 10/23/2009   cpap   RBBB 10/26/2009   [place]   Sciatic nerve pain    Sleep apnea    Cpap   SOB (shortness of breath)    Spinal stenosis of  lumbar region 08/18/2012   THROMBOCYTOPENIA 09/13/2008   Past Surgical History:  Procedure Laterality Date   BACK SURGERY     CARDIAC CATHETERIZATION     10 yrs ago   COLONOSCOPY  06/27/2016   MAXIMUM ACCESS (MAS)POSTERIOR LUMBAR INTERBODY FUSION (PLIF) 1 LEVEL N/A 05/27/2013   Procedure: FOR MAXIMUM ACCESS (MAS) POSTERIOR LUMBAR INTERBODY FUSION (PLIF) 1 LEVEL ,LUMBAR FOUR-FIVE;  Surgeon: Tia Alert, MD;  Location: [facility];  Service: Neurosurgery;  Laterality: N/A;  FOR MAXIMUM ACCESS (MAS) POSTERIOR LUMBAR INTERBODY FUSION (PLIF) 1 LEVEL ,LUMBAR FOUR-FIVE   NASAL POLYP EXCISION     TONSILLECTOMY      reports that he has never smoked. He has never used smokeless tobacco. He reports current alcohol use of about 1.0 standard drink of alcohol per week. He reports that he does not use drugs. family history includes Alcoholism in his father; COPD in his father; Colon polyps in his father; Heart disease in his father; Heart disease (age of onset: 48) in his mother; Hyperlipidemia in his sister; Hypertension in his sister. Allergies  Allergen Reactions   Adhesive [Tape] Rash   Codeine Sulfate Other (See Comments)    urination problems   Latex Rash    Review of Systems  Constitutional:  Negative for chills, fever  and malaise/fatigue.  Eyes:  Negative for blurred vision.  Respiratory:  Negative for shortness of breath.   Cardiovascular:  Negative for chest pain.  Gastrointestinal:  Negative for abdominal pain, nausea and vomiting.  Genitourinary:  Negative for dysuria.  Neurological:  Negative for weakness and headaches.       See HPI      Objective:     BP 124/70 (BP Location: Left Arm, Cuff Size: Normal)   Pulse 80   Temp 98.6 F (37 C) (Oral)   Ht 5\' 8"  (1.727 m)   Wt 191 lb 12.8 oz (87 kg)   SpO2 98%   BMI 29.16 kg/m  BP Readings from Last 3 Encounters:  11/25/22 124/70  11/20/22 136/79  11/13/22 (!) 153/85   Wt Readings from Last 3 Encounters:  11/25/22 191  lb 12.8 oz (87 kg)  11/20/22 197 lb 6.4 oz (89.5 kg)  11/13/22 185 lb (83.9 kg)      Physical Exam Vitals reviewed.  Constitutional:      General: He is not in acute distress.    Appearance: He is well-developed. He is not ill-appearing.  HENT:     Right Ear: External ear normal.     Left Ear: External ear normal.  Eyes:     Pupils: Pupils are equal, round, and reactive to light.  Neck:     Thyroid: No thyromegaly.     Comments: No carotid bruits Cardiovascular:     Rate and Rhythm: Normal rate and regular rhythm.  Pulmonary:     Effort: Pulmonary effort is normal. No respiratory distress.     Breath sounds: Normal breath sounds. No wheezing or rales.  Musculoskeletal:     Cervical back: Neck supple.     Right lower leg: No edema.     Left lower leg: No edema.  Neurological:     Mental Status: He is alert and oriented to person, place, and time.     Cranial Nerves: No cranial nerve deficit.     Motor: No weakness.      No results found for any visits on 11/25/22.  Last CBC Lab Results  Component Value Date   WBC 9.0 11/13/2022   HGB 13.6 11/13/2022   HCT 40.5 11/13/2022   MCV 89.6 11/13/2022   MCH 30.1 11/13/2022   RDW 13.7 11/13/2022   PLT 113 (L) 11/13/2022   Last metabolic panel Lab Results  Component Value Date   GLUCOSE 114 (H) 11/13/2022   NA 137 11/13/2022   K 4.2 11/13/2022   CL 104 11/13/2022   CO2 22 11/13/2022   BUN 20 11/13/2022   CREATININE 1.07 11/13/2022   GFRNONAA >60 11/13/2022   CALCIUM 9.9 11/13/2022   PROT 7.5 01/30/2022   ALBUMIN 4.4 01/30/2022   LABGLOB 2.6 03/09/2020   AGRATIO 1.7 03/09/2020   BILITOT 0.5 01/30/2022   ALKPHOS 49 01/30/2022   AST 24 01/30/2022   ALT 16 01/30/2022   ANIONGAP 11 11/13/2022      The ASCVD Risk score (Arnett DK, et al., 2019) failed to calculate for the following reasons:   The valid total cholesterol range is 130 to 320 mg/dL    Assessment & Plan:   Recent syncopal episode during  exercise.  Patient has had no episodes since then.  He has seen cardiology and has pending echocardiogram and stress test scheduled.  EKG showed right bundle branch block with no acute changes.  Patient improved following hydration.  Lab work unremarkable. -We have  cautioned him to avoid peak time for heat outdoors -Increase hydration -Follow-up immediately for any recurrent episodes of syncope -Keep cardiology follow-up as above for echo and stress test. -Consider outpatient event monitor for any recurrent episodes   Evelena Peat, MD

## 2022-11-27 DIAGNOSIS — M1711 Unilateral primary osteoarthritis, right knee: Secondary | ICD-10-CM | POA: Insufficient documentation

## 2022-12-04 DIAGNOSIS — I10 Essential (primary) hypertension: Secondary | ICD-10-CM | POA: Diagnosis not present

## 2022-12-04 DIAGNOSIS — E782 Mixed hyperlipidemia: Secondary | ICD-10-CM | POA: Diagnosis not present

## 2022-12-05 LAB — BASIC METABOLIC PANEL
BUN/Creatinine Ratio: 24 (ref 10–24)
BUN: 29 mg/dL — ABNORMAL HIGH (ref 8–27)
Chloride: 102 mmol/L (ref 96–106)
Creatinine, Ser: 1.22 mg/dL (ref 0.76–1.27)
Glucose: 104 mg/dL — ABNORMAL HIGH (ref 70–99)
Potassium: 4.5 mmol/L (ref 3.5–5.2)
Sodium: 143 mmol/L (ref 134–144)
eGFR: 61 mL/min/{1.73_m2} (ref >59)

## 2022-12-05 LAB — LIPID PANEL WITH LDL/HDL RATIO
Cholesterol, Total: 105 mg/dL (ref 100–199)
HDL: 31 mg/dL — ABNORMAL LOW (ref >39)
LDL Chol Calc (NIH): 48 mg/dL (ref 0–99)
LDL/HDL Ratio: 1.5 ratio (ref 0.0–3.6)
Triglycerides: 153 mg/dL — ABNORMAL HIGH (ref 0–149)
VLDL Cholesterol Cal: 26 mg/dL (ref 5–40)

## 2022-12-12 ENCOUNTER — Other Ambulatory Visit: Payer: Self-pay | Admitting: Family Medicine

## 2022-12-12 ENCOUNTER — Other Ambulatory Visit: Payer: PPO

## 2022-12-14 IMAGING — DX DG LUMBAR SPINE COMPLETE 4+V
5 series · 5 of 5 positions shown · non-contrast
Comparison: Lumbar spine x-ray 12/14/2013.

CLINICAL DATA: Back pain and lower extremity pain.

EXAM:
LUMBAR SPINE - COMPLETE 4+ VIEW

[lumbar spine ap]
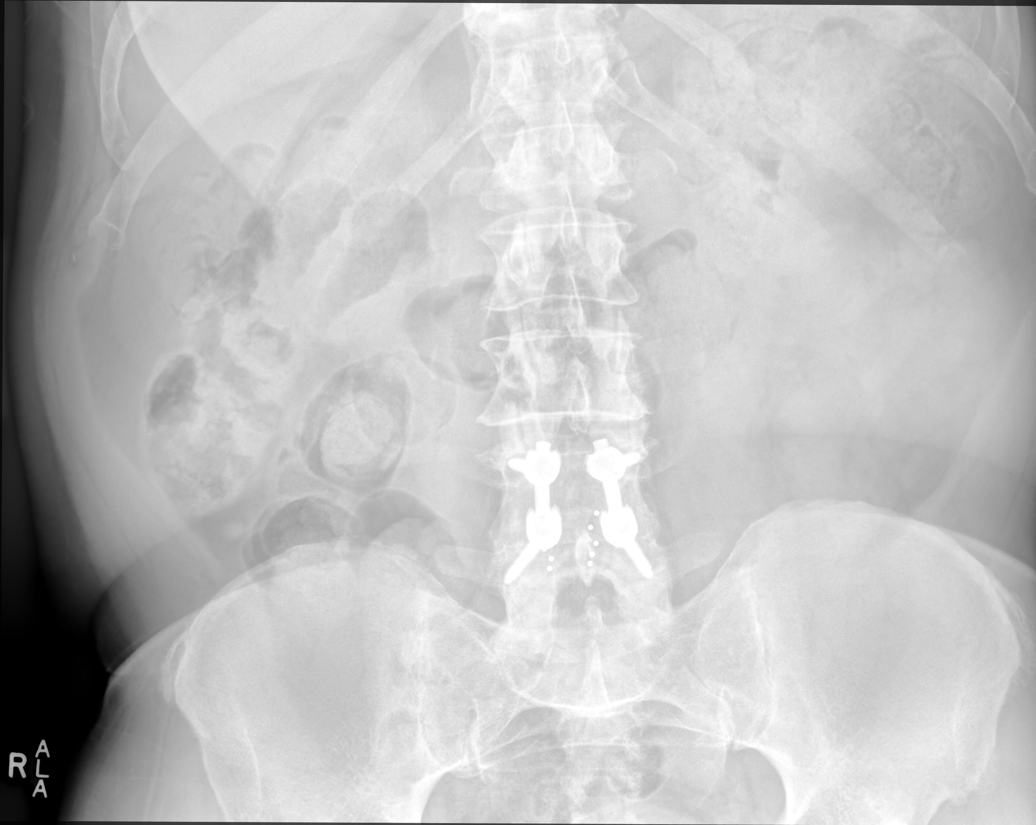

[lumbar spine oblique (1 of 2)]
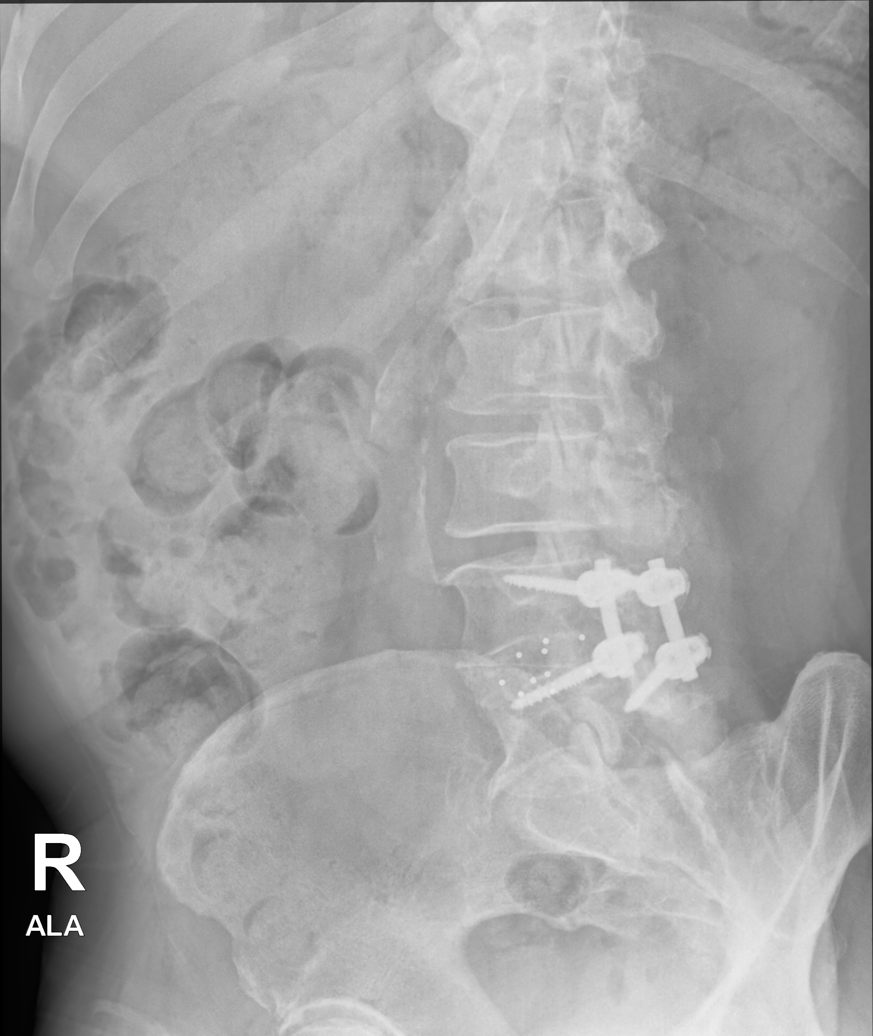

[lumbar spine oblique (2 of 2)]
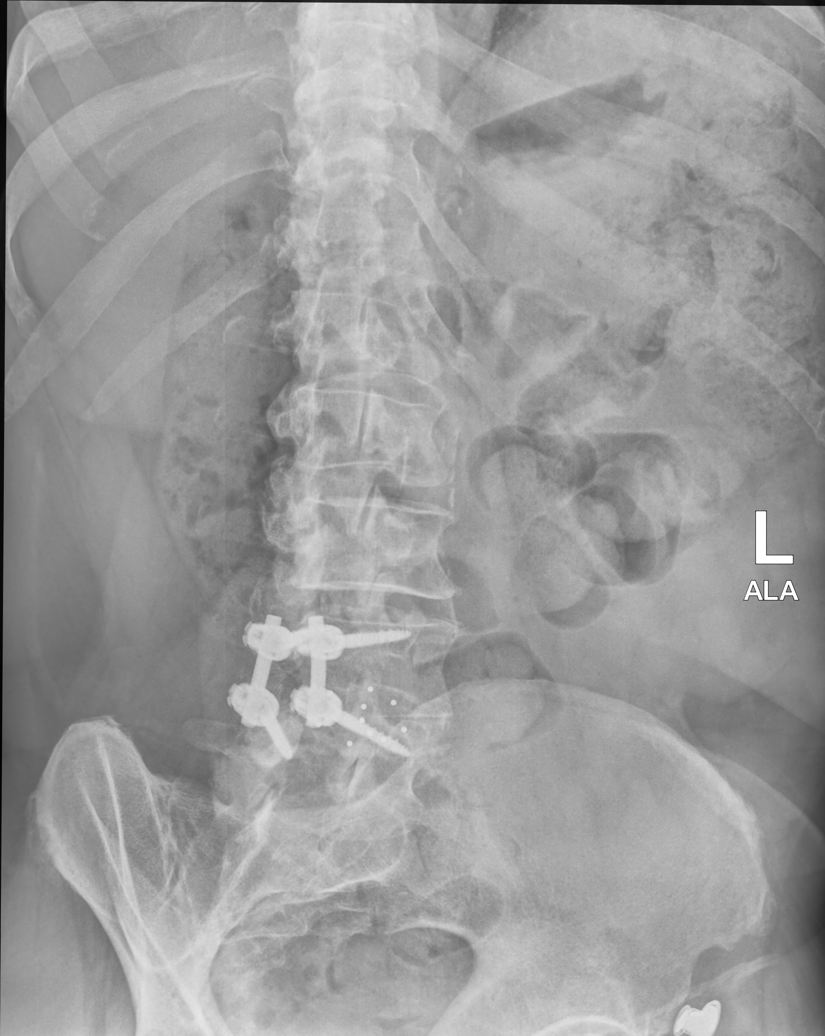

[lumbar spine lat (1 of 2)]
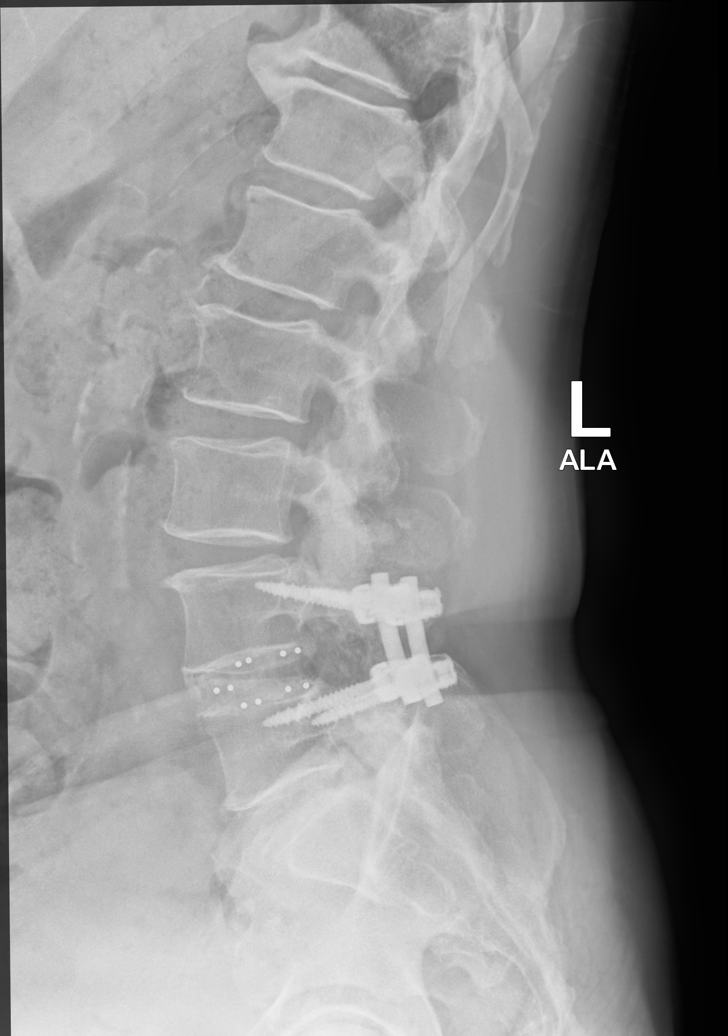

[lumbar spine lat (2 of 2)]
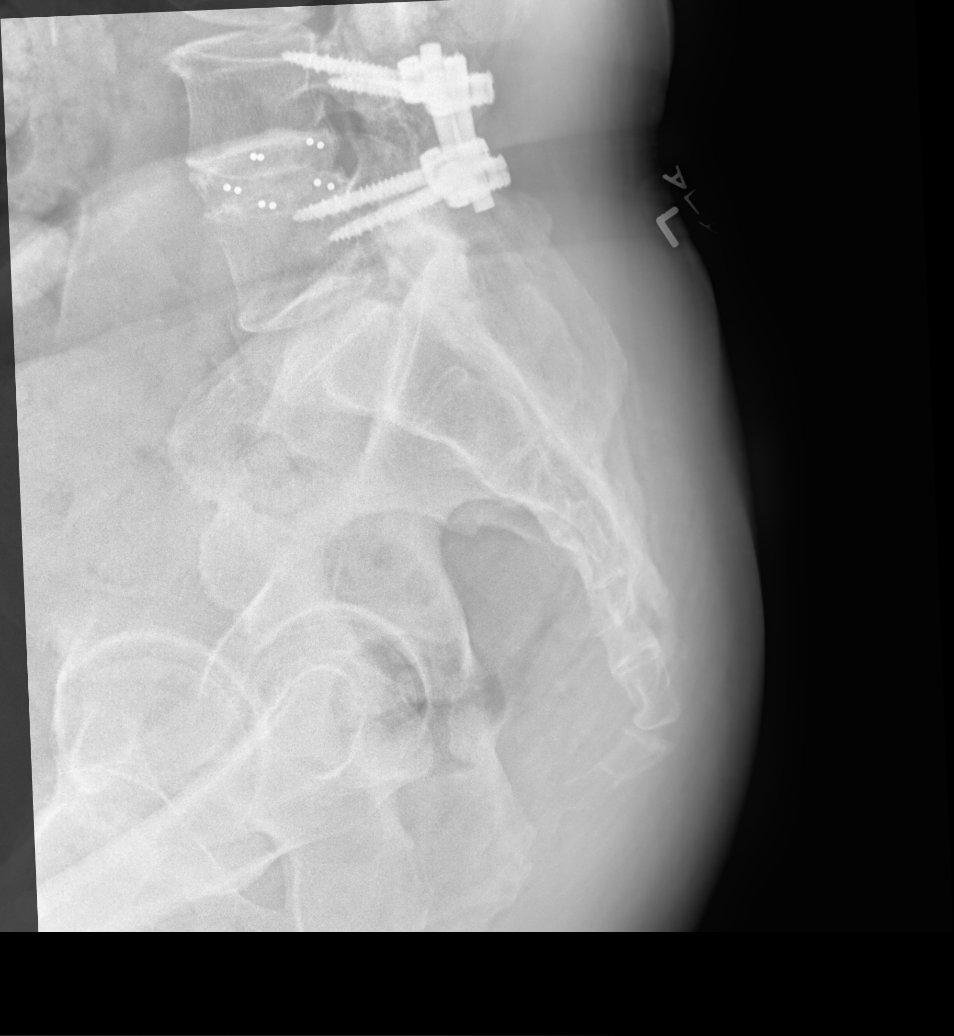

[5 of 5 positions shown; findings below may reference images not displayed]

FINDINGS: L4-L5 posterior fusion hardware appears unchanged and uncomplicated.
Disc spacer is present at this level, unchanged.

Spinal alignment is within normal limits. There is no acute
fracture. Disc spaces appear maintained. There are atherosclerotic
calcifications of the aorta. Anterior endplate osteophytes are seen
most significant at T11-T12.
IMPRESSION: No acute bony abnormality.

L4-L5 posterior fusion appears uncomplicated.

## 2022-12-15 ENCOUNTER — Other Ambulatory Visit: Payer: Self-pay | Admitting: Family Medicine

## 2022-12-19 DIAGNOSIS — Z7729 Contact with and (suspected ) exposure to other hazardous substances: Secondary | ICD-10-CM | POA: Insufficient documentation

## 2022-12-20 DIAGNOSIS — R55 Syncope and collapse: Secondary | ICD-10-CM | POA: Diagnosis not present

## 2022-12-20 DIAGNOSIS — H8101 Meniere's disease, right ear: Secondary | ICD-10-CM | POA: Diagnosis not present

## 2022-12-20 DIAGNOSIS — H6123 Impacted cerumen, bilateral: Secondary | ICD-10-CM | POA: Diagnosis not present

## 2023-01-02 ENCOUNTER — Ambulatory Visit (AMBULATORY_SURGERY_CENTER): Payer: PPO

## 2023-01-02 ENCOUNTER — Ambulatory Visit: Payer: PPO

## 2023-01-02 VITALS — Ht 68.0 in | Wt 190.0 lb

## 2023-01-02 DIAGNOSIS — R55 Syncope and collapse: Secondary | ICD-10-CM | POA: Diagnosis not present

## 2023-01-02 DIAGNOSIS — Z86010 Personal history of colonic polyps: Secondary | ICD-10-CM

## 2023-01-02 MED ORDER — NA SULFATE-K SULFATE-MG SULF 17.5-3.13-1.6 GM/177ML PO SOLN: 1.0000 | Freq: Once | ORAL | 0 refills | Status: AC

## 2023-01-02 NOTE — Progress Notes (Signed)
No egg or soy allergy known to patient  No issues known to pt with past sedation with any surgeries or procedures Patient denies ever being told they had issues or difficulty with intubation  No FH of Malignant Hyperthermia Pt is not on diet pills Pt is not on  home 02  Pt is not on blood thinners  Pt denies issues with constipation  No A fib or A flutter Have any cardiac testing pending--stress test and ECHO 01/02/23 LOA: independent  Prep: 2 day suprep   PV competed with patient. Prep instructions sent via mychart and home address. Goodrx coupon for [facility] provided to use for price reduction if needed.

## 2023-01-03 ENCOUNTER — Encounter: Payer: Self-pay | Admitting: Internal Medicine

## 2023-01-06 ENCOUNTER — Encounter: Payer: Self-pay | Admitting: Cardiology

## 2023-01-06 ENCOUNTER — Ambulatory Visit: Payer: PPO | Admitting: Cardiology

## 2023-01-06 VITALS — BP 143/82 | HR 62 | Resp 16 | Ht 68.0 in | Wt 190.0 lb

## 2023-01-06 DIAGNOSIS — E782 Mixed hyperlipidemia: Secondary | ICD-10-CM | POA: Diagnosis not present

## 2023-01-06 DIAGNOSIS — I951 Orthostatic hypotension: Secondary | ICD-10-CM

## 2023-01-06 DIAGNOSIS — I10 Essential (primary) hypertension: Secondary | ICD-10-CM | POA: Diagnosis not present

## 2023-01-06 MED ORDER — LOSARTAN POTASSIUM 25 MG PO TABS
25.0000 mg | ORAL_TABLET | Freq: Every day | ORAL | 0 refills | Status: DC
Start: 2023-01-06 — End: 2023-06-02

## 2023-01-06 MED ORDER — MIDODRINE HCL 5 MG PO TABS
5.0000 mg | ORAL_TABLET | Freq: Three times a day (TID) | ORAL | 3 refills | Status: DC | PRN
Start: 2023-01-06 — End: 2023-04-07

## 2023-01-06 NOTE — Progress Notes (Signed)
Treadmill Exercise Stress 01/02/2023: The patient exercised for 6 minutes and 19 seconds on Bruce protocol; achieved 7.54 METs at 105% of maximum predicted heart rate.  Stress terminated due to dyspnea and THR achieved.  Resting EKG demonstrated normal sinus rhythm with incomplete right bundle branch block and nonspecific ST-T abnormality.  Stress EKG is uninterpretable due to baseline artifact.  Recovery EKG at 1 minute and at 3-minute revealed 1 mm horizontal ST depression with T wave inversion in the inferolateral leads.  Again artifact was noted.  Stress test is mildly positive for ischemia.  No arrhythmias noted on ECG at stress.  The heart rate response was normal.  The baseline blood pressure was 134/78 mmHg and increased to 230/90 mmHg at peak exercise, which is a hypertensive.

## 2023-01-06 NOTE — Progress Notes (Signed)
Primary Physician/Referring:  Kristian Covey, MD  Patient ID: Donald Pope, male    DOB: 1946-04-20, 77 y.o.   MRN: [id number]  Chief Complaint  Patient presents with   Hyperlipidemia   Hypertension   Loss of Consciousness   HPI:    Donald Pope  is a 77 y.o.  Fairly active Caucasian male patient with nonobstructive coronary disease by cardiac catheterization in 2007, hypercholesterolemia, OSA on CPAP, patient presented with syncope on 11/13/2022 to the emergency room after he was playing pickle ball, got lightheaded and then had syncope.  Syncope felt to be secondary to orthostatic hypotension and also vasovagal episodes previously as he had clear-cut premonitory symptoms.  He underwent echocardiogram and stress testing and presents for follow-up.  He had 1 moderate episode of dizziness while he was transferring some heavy objects to the church, dizziness when he suddenly stands up and is wondering whether his Mnire's disease has returned and has an appointment to see ENT.  Otherwise remains asymptomatic.  He has not had any syncope.  He has been wearing support stockings when he goes for a walk but has not resumed playing pickle ball.  Past Medical History:  Diagnosis Date   Allergy    Anxiety    Back pain    Benign prostatic hypertrophy    possible   Bilateral leg cramps    CAD, NATIVE VESSEL 10/23/2009   Chest pain    DEPRESSION 09/13/2008   Depression    False positive stress test    history of, suggestive of inferior ischemia   GERD 09/13/2008   History of GI bleed    HYPERLIPIDEMIA 09/13/2008   Hypertension    Meniere's disease    OBSTRUCTIVE SLEEP APNEA 10/23/2009   cpap   RBBB 10/26/2009   [place]   Sciatic nerve pain    Sleep apnea    Cpap   SOB (shortness of breath)    Spinal stenosis of lumbar region 08/18/2012   THROMBOCYTOPENIA 09/13/2008   Past Surgical History:  Procedure Laterality Date   BACK SURGERY     CARDIAC CATHETERIZATION     10 yrs  ago   COLONOSCOPY  06/27/2016   MAXIMUM ACCESS (MAS)POSTERIOR LUMBAR INTERBODY FUSION (PLIF) 1 LEVEL N/A 05/27/2013   Procedure: FOR MAXIMUM ACCESS (MAS) POSTERIOR LUMBAR INTERBODY FUSION (PLIF) 1 LEVEL ,LUMBAR FOUR-FIVE;  Surgeon: Tia Alert, MD;  Location: [facility];  Service: Neurosurgery;  Laterality: N/A;  FOR MAXIMUM ACCESS (MAS) POSTERIOR LUMBAR INTERBODY FUSION (PLIF) 1 LEVEL ,LUMBAR FOUR-FIVE   NASAL POLYP EXCISION     TONSILLECTOMY     Family History  Problem Relation Age of Onset   Heart disease Mother 44       CHF   Heart disease Father    COPD Father    Colon polyps Father    Alcoholism Father    Hypertension Sister    Hyperlipidemia Sister    Stomach cancer Neg Hx    Colon cancer Neg Hx    Esophageal cancer Neg Hx    Ulcerative colitis Neg Hx     Social History   Tobacco Use   Smoking status: Never   Smokeless tobacco: Never  Substance Use Topics   Alcohol use: Yes    Alcohol/week: 1.0 standard drink of alcohol    Types: 1 Glasses of wine per week    Comment: 4-5 times a week   Marital Status: Married  ROS  Review of Systems  Cardiovascular:  Negative  for chest pain, dyspnea on exertion and leg swelling.  Neurological:  Positive for dizziness.   Objective      01/06/2023    2:30 PM 01/02/2023   10:47 AM 11/25/2022    2:27 PM  Vitals with BMI  Height 5\' 8"  5\' 8"    Weight 190 lbs 190 lbs   BMI 28.9 28.9   Systolic 143  124  Diastolic 82  70  Pulse 62     Blood pressure (!) 143/82, pulse 62, resp. rate 16, height 5\' 8"  (1.727 m), weight 190 lb (86.2 kg), SpO2 96%.  Orthostatic VS for the past 72 hrs (Last 3 readings):  Orthostatic BP Patient Position BP Location Cuff Size Orthostatic Pulse  01/06/23 1434 120/66 Standing Left Arm Large 65  01/06/23 1433 139/72 Sitting Left Arm Large 57  01/06/23 1432 144/72 Supine Left Arm Large 64     Physical Exam Neck:     Vascular: No carotid bruit or JVD.  Cardiovascular:     Rate and Rhythm:  Normal rate and regular rhythm.     Pulses: Intact distal pulses.     Heart sounds: Normal heart sounds. No murmur heard.    No gallop.  Pulmonary:     Effort: Pulmonary effort is normal.     Breath sounds: Normal breath sounds.  Abdominal:     General: Bowel sounds are normal.     Palpations: Abdomen is soft.  Musculoskeletal:     Right lower leg: No edema.     Left lower leg: No edema.    Laboratory examination:   Recent Labs    01/30/22 0914 11/13/22 1554 12/04/22 1555  NA 137 137 143  K 4.2 4.2 4.5  CL 101 104 102  CO2 27 22 23   GLUCOSE 106* 114* 104*  BUN 21 20 29*  CREATININE 1.18 1.07 1.22  CALCIUM 9.5 9.9 9.6  GFRNONAA  --  >60  --     Lab Results  Component Value Date   GLUCOSE 104 (H) 12/04/2022   NA 143 12/04/2022   K 4.5 12/04/2022   CL 102 12/04/2022   CO2 23 12/04/2022   BUN 29 (H) 12/04/2022   CREATININE 1.22 12/04/2022   GFRNONAA >60 11/13/2022   CALCIUM 9.6 12/04/2022   PROT 7.5 01/30/2022   ALBUMIN 4.4 01/30/2022   LABGLOB 2.6 03/09/2020   AGRATIO 1.7 03/09/2020   BILITOT 0.5 01/30/2022   ALKPHOS 49 01/30/2022   AST 24 01/30/2022   ALT 16 01/30/2022   ANIONGAP 11 11/13/2022      Lab Results  Component Value Date   ALT 16 01/30/2022   AST 24 01/30/2022   ALKPHOS 49 01/30/2022   BILITOT 0.5 01/30/2022       Latest Ref Rng & Units 11/13/2022    3:54 PM 01/30/2022    9:14 AM 05/16/2021   11:41 AM  CBC  WBC 4.0 - 10.5 K/uL 9.0  5.5  6.5   Hemoglobin 13.0 - 17.0 g/dL 69.6  29.5  9.8   Hematocrit 39.0 - 52.0 % 40.5  40.5  30.5   Platelets 150 - 400 K/uL 113  121.0  148.0        Latest Ref Rng & Units 01/30/2022    9:14 AM 04/30/2021    3:31 PM 04/23/2021    1:45 AM  Hepatic Function  Total Protein 6.0 - 8.3 g/dL 7.5  7.0  5.5   Albumin 3.5 - 5.2 g/dL 4.4  4.3  3.4  AST 0 - 37 U/L 24  22  17    ALT 0 - 53 U/L 16  14  13    Alk Phosphatase 39 - 117 U/L 49  56  33   Total Bilirubin 0.2 - 1.2 mg/dL 0.5  0.3  0.7   Bilirubin,  Direct 0.0 - 0.3 mg/dL 0.1      Lipid Panel Recent Labs    01/30/22 0914 12/04/22 1555  CHOL 186 105  TRIG 335.0* 153*  LDLCALC  --  48  VLDL 67.0*  --   HDL 33.10* 31*  CHOLHDL 6  --   LDLDIRECT 129.0  --    Last vitamin D Lab Results  Component Value Date   VD25OH 56.7 06/21/2020   HEMOGLOBIN A1C Lab Results  Component Value Date   HGBA1C 5.8 (H) 06/21/2020   TSH Recent Labs    01/30/22 0914  TSH 4.28   Radiology:   Chest x-ray two-view 11/13/2022: The lungs are clear without focal pneumonia, edema, pneumothorax or pleural effusion. The cardiopericardial silhouette is within normal limits for size. No acute bony abnormality. Telemetry leads overlie the chest. Cardiac Studies:   Coronary angiogram 09/24/2005:  1. LV:  119/4/9.  EF 65% without regional wall motion abnormality.  2. No aortic stenosis or mitral regurgitation.  3. Left main:  Angiographically normal.  4. LAD:  Moderate-sized vessel giving rise to two small diagonals.  The      proximal LAD has a 20% stenosis.  A septal has an 80% stenosis at its      ostium.  The mid LAD has a focal 30% stenosis.  5. Ramus intermedius:  Fairly small vessel which is angiographically normal.  6. Circumflex:  Small vessel with 30% stenosis in the mid vessel.  7. RCA:  Large, dominant vessel.  There is a calcified 30% stenosis of the mid vessel.  There is a focal 40% stenosis approximately 20 mm prior to      the origin of the PDA.  Treadmill Exercise Stress 01/02/2023: The patient exercised for 6 minutes and 19 seconds on Bruce protocol; achieved 7.54 METs at 105% of maximum predicted heart rate.  Stress terminated due to dyspnea and THR achieved.  Resting EKG demonstrated normal sinus rhythm with incomplete right bundle branch block and nonspecific ST-T abnormality.  Stress EKG is uninterpretable due to baseline artifact.  Recovery EKG at 1 minute and at 3-minute revealed 1 mm horizontal ST depression with T wave  inversion in the inferolateral leads.  Again artifact was noted.  Stress test is mildly positive for ischemia.  No arrhythmias noted on ECG at stress.  The heart rate response was normal.  The baseline blood pressure was 134/78 mmHg and increased to 230/90 mmHg at peak exercise, which is a hypertensive.  Echocardiogram 01/02/2023: Study Quality: Technically difficult study. Normal LV systolic function with visual EF 60-65%. Left ventricle cavity is normal in size. Normal left ventricular wall thickness. Normal global wall motion. Normal diastolic filling pattern, normal LAP. No significant valvular heart disease. No prior study for comparison.     EKG:   EKG 11/20/2022: Normal sinus rhythm with rate of 63 bpm, normal axis, right bundle branch block.  No significant change from EKG 11/13/2022, heart rate was 82 bpm.   Medications and allergies   Allergies  Allergen Reactions   Codeine Sulfate Other (See Comments)    urination problems   Latex Rash   Tape Rash     Medication list   Current  Outpatient Medications:    baclofen (LIORESAL) 10 MG tablet, TAKE 1 TABLET (10 MG TOTAL) BY MOUTH 3 (THREE) TIMES DAILY. AS NEEDED FOR MUSCLE SPASM, Disp: 60 tablet, Rfl: 2   citalopram (CELEXA) 10 MG tablet, TAKE 1 TABLET BY MOUTH EVERY DAY, Disp: 90 tablet, Rfl: 0   Coenzyme Q10 (CO Q 10 PO), Take 1 tablet by mouth daily., Disp: , Rfl:    cyanocobalamin (VITAMIN B12) 1000 MCG tablet, Take by mouth., Disp: , Rfl:    fluticasone (FLONASE) 50 MCG/ACT nasal spray, Place 2 sprays into both nostrils every other day., Disp: , Rfl:    gabapentin (NEURONTIN) 600 MG tablet, TAKE 2 TABLETS BY MOUTH TWICE A DAY, Disp: 180 tablet, Rfl: 0   losartan (COZAAR) 25 MG tablet, Take 1 tablet (25 mg total) by mouth daily., Disp: 30 tablet, Rfl: 2   melatonin 5 MG TABS, Take 5 mg by mouth at bedtime., Disp: , Rfl:    Multiple Vitamin (MULTI-VITAMIN) tablet, Take 1 tablet by mouth daily., Disp: , Rfl:    omeprazole  (PRILOSEC) 20 MG capsule, Take 20 mg by mouth every other day., Disp: , Rfl:    rosuvastatin (CRESTOR) 10 MG tablet, Take 1 tablet (10 mg total) by mouth daily., Disp: 90 tablet, Rfl: 3   tamsulosin (FLOMAX) 0.4 MG CAPS capsule, TAKE 1 CAPSULE BY MOUTH EVERY DAY, Disp: 90 capsule, Rfl: 0   triamcinolone cream (KENALOG) 0.1 %, Apply 1 Application topically 2 (two) times daily. (Patient not taking: Reported on 01/02/2023), Disp: 30 g, Rfl: 2  Assessment     ICD-10-CM   1. Orthostatic hypotension  I95.1     2. Supine hypertension  I10     3. Mixed hyperlipidemia  E78.2         No orders of the defined types were placed in this encounter.   No orders of the defined types were placed in this encounter.   There are no discontinued medications.    Recommendations:   Donald Pope is a 77 y.o. Fairly active Caucasian male patient with nonobstructive coronary disease by cardiac catheterization in 2007, hypercholesterolemia, OSA on CPAP, patient presented with syncope on 11/13/2022 to the emergency room after he was playing pickle ball, got lightheaded and then had syncope.  Syncope felt to be secondary to orthostatic hypotension and also vasovagal episodes previously as he had clear-cut premonitory symptoms.  He underwent echocardiogram and stress testing and presents for follow-up.  1. Supine hypertension Patient clearly has supine hypertension and orthostatic hypotension.  I had started him on losartan 25 mg daily, there is improvement in blood pressure, difficult situation discussed with the patient that his blood pressure needs to be monitored closely and only treat supine blood pressure and not sitting blood pressure.  Hydration, use of support stockings discussed with the patient.  - losartan (COZAAR) 25 MG tablet; Take 1 tablet (25 mg total) by mouth daily.  Dispense: 90 tablet; Refill: 0  2. Orthostatic hypotension Patient clearly has orthostatic hypotension as evidenced today with a  20 mmHg drop in blood pressure.  Advised him good hydration, start him on midodrine to be taken on a as needed basis especially when he wants to play pickle ball or he wants to work in the yard and also advised not to take the medication before going to bed or laying down. Sleeping reclined also discussed with patient to reduce hypertension effect.  Do not suspect any arrhythmic etiology for his syncope.  I also reviewed  his stress test, although he has mildly abnormal EKG response, I suspect this is related to arterial sclerosis and hypertensive blood pressure response and not ischemia he has excellent exercise tolerance for his age.  Echocardiogram reveals normal wall motion.  Overall low risk.  Advised him that he could start playing pickle ball, good hydration, electrolytes, wearing support stockings while playing and potentially using midodrine on a as needed basis also discussed with the patient and his wife is present.  - midodrine (PROAMATINE) 5 MG tablet; Take 1 tablet (5 mg total) by mouth 3 (three) times daily as needed. Do not lay down after taking the medication for at least 2 hours.  Take it before intense activity and exercise 1 hour before  Dispense: 90 tablet; Refill: 3  3. Mixed hyperlipidemia Patient's lipids are well-controlled.  He is now tolerating statin that I had started on his previous with rosuvastatin 10 mg daily office visit for moderate coronary disease.  Continue the same.  I could see him back on a as needed basis however patient wishes to see me back at least once a year hence appointment made.     Yates Decamp, MD, [facility] 01/06/2023, 3:05 PM Office: [phone]

## 2023-01-06 NOTE — Progress Notes (Signed)
Echocardiogram 01/02/2023: Study Quality: Technically difficult study. Normal LV systolic function with visual EF 60-65%. Left ventricle cavity is normal in size. Normal left ventricular wall thickness. Normal global wall motion. Normal diastolic filling pattern, normal LAP.  No significant valvular heart disease.  No prior study for comparison.

## 2023-01-27 ENCOUNTER — Encounter: Payer: PPO | Admitting: Internal Medicine

## 2023-02-13 ENCOUNTER — Other Ambulatory Visit: Payer: Self-pay | Admitting: Family Medicine

## 2023-02-17 DIAGNOSIS — H8101 Meniere's disease, right ear: Secondary | ICD-10-CM | POA: Diagnosis not present

## 2023-02-17 DIAGNOSIS — H6123 Impacted cerumen, bilateral: Secondary | ICD-10-CM | POA: Diagnosis not present

## 2023-02-17 DIAGNOSIS — R55 Syncope and collapse: Secondary | ICD-10-CM | POA: Diagnosis not present

## 2023-02-24 ENCOUNTER — Ambulatory Visit: Payer: PPO | Admitting: Internal Medicine

## 2023-02-24 ENCOUNTER — Encounter: Payer: Self-pay | Admitting: Internal Medicine

## 2023-02-24 VITALS — BP 125/68 | HR 68 | Temp 98.6°F | Resp 14 | Ht 68.0 in | Wt 190.0 lb

## 2023-02-24 DIAGNOSIS — F419 Anxiety disorder, unspecified: Secondary | ICD-10-CM | POA: Diagnosis not present

## 2023-02-24 DIAGNOSIS — Z09 Encounter for follow-up examination after completed treatment for conditions other than malignant neoplasm: Secondary | ICD-10-CM

## 2023-02-24 DIAGNOSIS — Z860100 Personal history of colon polyps, unspecified: Secondary | ICD-10-CM

## 2023-02-24 DIAGNOSIS — G4733 Obstructive sleep apnea (adult) (pediatric): Secondary | ICD-10-CM | POA: Diagnosis not present

## 2023-02-24 DIAGNOSIS — D123 Benign neoplasm of transverse colon: Secondary | ICD-10-CM

## 2023-02-24 DIAGNOSIS — D122 Benign neoplasm of ascending colon: Secondary | ICD-10-CM

## 2023-02-24 DIAGNOSIS — Z860101 Personal history of adenomatous and serrated colon polyps: Secondary | ICD-10-CM | POA: Diagnosis not present

## 2023-02-24 DIAGNOSIS — I2510 Atherosclerotic heart disease of native coronary artery without angina pectoris: Secondary | ICD-10-CM | POA: Diagnosis not present

## 2023-02-24 DIAGNOSIS — F32A Depression, unspecified: Secondary | ICD-10-CM | POA: Diagnosis not present

## 2023-02-24 MED ORDER — SODIUM CHLORIDE 0.9 % IV SOLN
500.0000 mL | Freq: Once | INTRAVENOUS | Status: DC
Start: 2023-02-24 — End: 2023-02-24

## 2023-02-24 NOTE — Progress Notes (Signed)
GASTROENTEROLOGY PROCEDURE H&P NOTE   Primary Care Physician: Kristian Covey, MD    Reason for Procedure:  History of SSP's and adenomas  Plan:    Colonoscopy  Patient is appropriate for endoscopic procedure(s) in the ambulatory ([facility]) setting.  The nature of the procedure, as well as the risks, benefits, and alternatives were carefully and thoroughly reviewed with the patient. Ample time for discussion and questions allowed. The patient understood, was satisfied, and agreed to proceed.     HPI: Donald Pope is a 77 y.o. male who presents for surveillance colonoscopy.  Medical history as below.  Tolerated the prep.  No recent chest pain or shortness of breath.  No abdominal pain today.  Past Medical History:  Diagnosis Date   Allergy    Anxiety    Back pain    Benign prostatic hypertrophy    possible   Bilateral leg cramps    CAD, NATIVE VESSEL 10/23/2009   Chest pain    DEPRESSION 09/13/2008   Depression    False positive stress test    history of, suggestive of inferior ischemia   GERD 09/13/2008   History of GI bleed    HYPERLIPIDEMIA 09/13/2008   Hypertension    Meniere's disease    OBSTRUCTIVE SLEEP APNEA 10/23/2009   cpap   RBBB 10/26/2009   [place]   Sciatic nerve pain    Sleep apnea    Cpap   SOB (shortness of breath)    Spinal stenosis of lumbar region 08/18/2012   THROMBOCYTOPENIA 09/13/2008    Past Surgical History:  Procedure Laterality Date   BACK SURGERY     CARDIAC CATHETERIZATION     10 yrs ago   COLONOSCOPY  06/27/2016   MAXIMUM ACCESS (MAS)POSTERIOR LUMBAR INTERBODY FUSION (PLIF) 1 LEVEL N/A 05/27/2013   Procedure: FOR MAXIMUM ACCESS (MAS) POSTERIOR LUMBAR INTERBODY FUSION (PLIF) 1 LEVEL ,LUMBAR FOUR-FIVE;  Surgeon: Tia Alert, MD;  Location: [facility];  Service: Neurosurgery;  Laterality: N/A;  FOR MAXIMUM ACCESS (MAS) POSTERIOR LUMBAR INTERBODY FUSION (PLIF) 1 LEVEL ,LUMBAR FOUR-FIVE   NASAL POLYP EXCISION      TONSILLECTOMY      Prior to Admission medications   Medication Sig Start Date End Date Taking? Authorizing Provider  baclofen (LIORESAL) 10 MG tablet TAKE 1 TABLET (10 MG TOTAL) BY MOUTH 3 (THREE) TIMES DAILY. AS NEEDED FOR MUSCLE SPASM 12/12/22  Yes Burchette, Elberta Fortis, MD  citalopram (CELEXA) 10 MG tablet TAKE 1 TABLET BY MOUTH EVERY DAY 02/14/23  Yes Burchette, Elberta Fortis, MD  Coenzyme Q10 (CO Q 10 PO) Take 1 tablet by mouth daily.   Yes [provider]  cyanocobalamin (VITAMIN B12) 1000 MCG tablet Take by mouth.   Yes [provider]  fluticasone (FLONASE) 50 MCG/ACT nasal spray Place 2 sprays into both nostrils every other day.   Yes [provider]  gabapentin (NEURONTIN) 600 MG tablet TAKE 2 TABLETS BY MOUTH TWICE A DAY 12/17/22  Yes Burchette, Elberta Fortis, MD  losartan (COZAAR) 25 MG tablet Take 1 tablet (25 mg total) by mouth daily. 01/06/23 04/06/23 Yes Yates Decamp, MD  melatonin 5 MG TABS Take 5 mg by mouth at bedtime.   Yes [provider]  Multiple Vitamin (MULTI-VITAMIN) tablet Take 1 tablet by mouth daily.   Yes [provider]  omeprazole (PRILOSEC) 20 MG capsule Take 20 mg by mouth every other day.   Yes [provider]  rosuvastatin (CRESTOR) 10 MG tablet Take 1  tablet (10 mg total) by mouth daily. 11/20/22  Yes Yates Decamp, MD  tamsulosin (FLOMAX) 0.4 MG CAPS capsule TAKE 1 CAPSULE BY MOUTH EVERY DAY 12/16/22  Yes Nelwyn Salisbury, MD  midodrine (PROAMATINE) 5 MG tablet Take 1 tablet (5 mg total) by mouth 3 (three) times daily as needed. Do not lay down after taking the medication for at least 2 hours.  Take it before intense activity and exercise 1 hour before Patient not taking: Reported on 02/24/2023 01/06/23   Yates Decamp, MD  triamcinolone cream (KENALOG) 0.1 % Apply 1 Application topically 2 (two) times daily. Patient not taking: Reported on 01/02/2023 03/26/22   Kristian Covey, MD    Current Outpatient Medications  Medication Sig  Dispense Refill   baclofen (LIORESAL) 10 MG tablet TAKE 1 TABLET (10 MG TOTAL) BY MOUTH 3 (THREE) TIMES DAILY. AS NEEDED FOR MUSCLE SPASM 60 tablet 2   citalopram (CELEXA) 10 MG tablet TAKE 1 TABLET BY MOUTH EVERY DAY 90 tablet 0   Coenzyme Q10 (CO Q 10 PO) Take 1 tablet by mouth daily.     cyanocobalamin (VITAMIN B12) 1000 MCG tablet Take by mouth.     fluticasone (FLONASE) 50 MCG/ACT nasal spray Place 2 sprays into both nostrils every other day.     gabapentin (NEURONTIN) 600 MG tablet TAKE 2 TABLETS BY MOUTH TWICE A DAY 180 tablet 0   losartan (COZAAR) 25 MG tablet Take 1 tablet (25 mg total) by mouth daily. 90 tablet 0   melatonin 5 MG TABS Take 5 mg by mouth at bedtime.     Multiple Vitamin (MULTI-VITAMIN) tablet Take 1 tablet by mouth daily.     omeprazole (PRILOSEC) 20 MG capsule Take 20 mg by mouth every other day.     rosuvastatin (CRESTOR) 10 MG tablet Take 1 tablet (10 mg total) by mouth daily. 90 tablet 3   tamsulosin (FLOMAX) 0.4 MG CAPS capsule TAKE 1 CAPSULE BY MOUTH EVERY DAY 90 capsule 0   midodrine (PROAMATINE) 5 MG tablet Take 1 tablet (5 mg total) by mouth 3 (three) times daily as needed. Do not lay down after taking the medication for at least 2 hours.  Take it before intense activity and exercise 1 hour before (Patient not taking: Reported on 02/24/2023) 90 tablet 3   triamcinolone cream (KENALOG) 0.1 % Apply 1 Application topically 2 (two) times daily. (Patient not taking: Reported on 01/02/2023) 30 g 2   Current Facility-Administered Medications  Medication Dose Route Frequency Provider Last Rate Last Admin   0.9 %  sodium chloride infusion  500 mL Intravenous Once Chaddrick Brue, Carie Caddy, MD        Allergies as of 02/24/2023 - Review Complete 02/24/2023  Allergen Reaction Noted   Codeine sulfate Other (See Comments) 09/13/2008   Latex Rash 05/17/2013   Tape Rash 05/17/2013    Family History  Problem Relation Age of Onset   Heart disease Mother 78       CHF   Heart  disease Father    COPD Father    Colon polyps Father    Alcoholism Father    Hypertension Sister    Hyperlipidemia Sister    Stomach cancer Neg Hx    Colon cancer Neg Hx    Esophageal cancer Neg Hx    Ulcerative colitis Neg Hx    Rectal cancer Neg Hx     Social History   Socioeconomic History   Marital status: Married    Spouse name: Leta Jungling  Mateo   Number of children: 1   Years of education: 16    Highest education level: Bachelor's degree (e.g., BA, AB, BS)  Occupational History   Occupation: retired  Tobacco Use   Smoking status: Never   Smokeless tobacco: Never  Vaping Use   Vaping status: Never Used  Substance and Sexual Activity   Alcohol use: Yes    Alcohol/week: 1.0 standard drink of alcohol    Types: 1 Glasses of wine per week    Comment: 4-5 times a week   Drug use: No   Sexual activity: Not Currently  Other Topics Concern   Not on file  Social History Narrative   Lives with wife in [place]. Son lives in [place], [place], 1 grandson.    Animal-lover, especially dogs.   Exercises routinely at [facility], enjoys yoga   Social Determinants of Health   Financial Resource Strain: Low Risk  (07/03/2022)   Overall Financial Resource Strain (CARDIA)    Difficulty of Paying Living Expenses: Not hard at all  Food Insecurity: Low Risk  (12/20/2022)   Received from [facility]   Hunger Vital Sign    Worried About Running Out of Food in the Last Year: Never true    Ran Out of Food in the Last Year: Never true  Transportation Needs: No Transportation Needs (12/20/2022)   Received from [facility]    In the past 12 months, has lack of reliable transportation kept you from medical appointments, meetings, work or from getting things needed for daily living? : No  Physical Activity: Sufficiently Active (07/03/2022)   Exercise Vital Sign    Days of Exercise per Week: 4 days    Minutes of Exercise per Session: 60 min  Stress: No Stress Concern Present  (07/03/2022)   [employer] of Occupational Health - Occupational Stress Questionnaire    Feeling of Stress : Not at all  Social Connections: Socially Integrated (07/03/2022)   Social Connection and Isolation Panel [NHANES]    Frequency of Communication with Friends and Family: More than three times a week    Frequency of Social Gatherings with Friends and Family: More than three times a week    Attends Religious Services: More than 4 times per year    Active Member of [employer] or Organizations: Yes    Attends Banker Meetings: More than 4 times per year    Marital Status: Married  Catering manager Violence: Not At Risk (07/03/2022)   Humiliation, Afraid, Rape, and Kick questionnaire    Fear of Current or Ex-Partner: No    Emotionally Abused: No    Physically Abused: No    Sexually Abused: No    Physical Exam: Vital signs in last 24 hours: @BP  126/69   Pulse 68   Temp 98.6 F (37 C) (Skin)   Ht 5\' 8"  (1.727 m)   Wt 190 lb (86.2 kg)   SpO2 98%   BMI 28.89 kg/m  GEN: NAD EYE: Sclerae anicteric ENT: MMM CV: Non-tachycardic Pulm: CTA b/l GI: Soft, NT/ND NEURO:  Alert & Oriented x 3   Erick Blinks, MD [facility]  02/24/2023 2:32 PM

## 2023-02-24 NOTE — Patient Instructions (Addendum)
- Resume previous diet. - Continue present medications. - Await pathology results. - No recommendation at this time regarding repeat colonoscopy due to age at next surveillance interval (5 years > 78 yrs old).   YOU HAD AN ENDOSCOPIC PROCEDURE TODAY AT THE [facility]:   Refer to the procedure report that was given to you for any specific questions about what was found during the examination.  If the procedure report does not answer your questions, please call your gastroenterologist to clarify.  If you requested that your care partner not be given the details of your procedure findings, then the procedure report has been included in a sealed envelope for you to review at your convenience later.  YOU SHOULD EXPECT: Some feelings of bloating in the abdomen. Passage of more gas than usual.  Walking can help get rid of the air that was put into your GI tract during the procedure and reduce the bloating. If you had a lower endoscopy (such as a colonoscopy or flexible sigmoidoscopy) you may notice spotting of blood in your stool or on the toilet paper. If you underwent a bowel prep for your procedure, you may not have a normal bowel movement for a few days.  Please Note:  You might notice some irritation and congestion in your nose or some drainage.  This is from the oxygen used during your procedure.  There is no need for concern and it should clear up in a day or so.  SYMPTOMS TO REPORT IMMEDIATELY:  Following lower endoscopy (colonoscopy or flexible sigmoidoscopy):  Excessive amounts of blood in the stool  Significant tenderness or worsening of abdominal pains  Swelling of the abdomen that is new, acute  Fever of 100F or higher  Following upper endoscopy (EGD)  Vomiting of blood or coffee ground material  New chest pain or pain under the shoulder blades  Painful or persistently difficult swallowing  New shortness of breath  Fever of 100F or higher  Black, tarry-looking  stools  For urgent or emergent issues, a gastroenterologist can be reached at any hour by calling [phone]. Do not use MyChart messaging for urgent concerns.    DIET:  We do recommend a small meal at first, but then you may proceed to your regular diet.  Drink plenty of fluids but you should avoid alcoholic beverages for 24 hours.  ACTIVITY:  You should plan to take it easy for the rest of today and you should NOT DRIVE or use heavy machinery until tomorrow (because of the sedation medicines used during the test).    FOLLOW UP: Our staff will call the number listed on your records the next business day following your procedure.  We will call around 7:15- 8:00 am to check on you and address any questions or concerns that you may have regarding the information given to you following your procedure. If we do not reach you, we will leave a message.     If any biopsies were taken you will be contacted by phone or by letter within the next 1-3 weeks.  Please call us at [phone] if you have not heard about the biopsies in 3 weeks.    SIGNATURES/CONFIDENTIALITY: You and/or your care partner have signed paperwork which will be entered into your electronic medical record.  These signatures attest to the fact that that the information above on your After Visit Summary has been reviewed and is understood.  Full responsibility of the confidentiality of this discharge information lies  with you and/or your care-partner.

## 2023-02-24 NOTE — Progress Notes (Signed)
Called to room to assist during endoscopic procedure.  Patient ID and intended procedure confirmed with present staff. Received instructions for my participation in the procedure from the performing physician.  

## 2023-02-24 NOTE — Progress Notes (Signed)
VS by DT  Pt's states no medical or surgical changes since previsit or office visit.

## 2023-02-24 NOTE — Progress Notes (Signed)
Uneventful anesthetic. Report to pacu rn. Vss. Care resumed by rn. 

## 2023-02-24 NOTE — Op Note (Signed)
[facility] Patient Name: Donald Pope Procedure Date: 02/24/2023 2:30 PM MRN: [id number] Endoscopist: Beverley Fiedler , MD, [id number] Age: 77 Referring MD:  Date of Birth: February 04, 1946 Gender: Male Account #: [id number] Procedure:                Colonoscopy Indications:              High risk colon cancer surveillance: Personal                            history of colonic polyps, Last colonoscopy: March                            2021 (SSP x 1, TA x1); remote SSP > 1 cm Medicines:                Monitored Anesthesia Care Procedure:                Pre-Anesthesia Assessment:                           - Prior to the procedure, a History and Physical                            was performed, and patient medications and                            allergies were reviewed. The patient's tolerance of                            previous anesthesia was also reviewed. The risks                            and benefits of the procedure and the sedation                            options and risks were discussed with the patient.                            All questions were answered, and informed consent                            was obtained. Prior Anticoagulants: The patient has                            taken no anticoagulant or antiplatelet agents. ASA                            Grade Assessment: III - A patient with severe                            systemic disease. After reviewing the risks and                            benefits, the patient was deemed in satisfactory  condition to undergo the procedure.                           After obtaining informed consent, the colonoscope                            was passed under direct vision. Throughout the                            procedure, the patient's blood pressure, pulse, and                            oxygen saturations were monitored continuously. The                            Olympus Scope SN:  [id number] was introduced through                            the anus and advanced to the cecum, identified by                            appendiceal orifice and ileocecal valve. The                            colonoscopy was performed without difficulty. The                            patient tolerated the procedure well. The quality                            of the bowel preparation was good after 2 day                            MiraLax + Suprep. The ileocecal valve, appendiceal                            orifice, and rectum were photographed. Scope In: 2:41:51 PM Scope Out: 3:00:06 PM Scope Withdrawal Time: 0 hours 12 minutes 57 seconds  Total Procedure Duration: 0 hours 18 minutes 15 seconds  Findings:                 The digital rectal exam was normal.                           A 7 mm polyp was found in the ascending colon. The                            polyp was sessile. The polyp was removed with a                            cold snare. Resection and retrieval were complete.                           A 4 mm polyp was  found in the transverse colon. The                            polyp was sessile. The polyp was removed with a                            cold snare. Resection and retrieval were complete.                           Multiple medium-mouthed and small-mouthed                            diverticula were found in the sigmoid colon and                            descending colon.                           Internal hemorrhoids were found during                            retroflexion. The hemorrhoids were medium-sized. Complications:            No immediate complications. Estimated Blood Loss:     Estimated blood loss: none. Impression:               - One 7 mm polyp in the ascending colon, removed                            with a cold snare. Resected and retrieved.                           - One 4 mm polyp in the transverse colon, removed                            with a  cold snare. Resected and retrieved.                           - Mild diverticulosis in the sigmoid colon and in                            the descending colon.                           - Internal hemorrhoids. Recommendation:           - Patient has a contact number available for                            emergencies. The signs and symptoms of potential                            delayed complications were discussed with the                            patient. Return to normal  activities tomorrow.                            Written discharge instructions were provided to the                            patient.                           - Resume previous diet.                           - Continue present medications.                           - Await pathology results.                           - No recommendation at this time regarding repeat                            colonoscopy due to age at next surveillance                            interval (5 years > 23 yrs old). Beverley Fiedler, MD 02/24/2023 3:03:14 PM This report has been signed electronically.

## 2023-02-25 ENCOUNTER — Telehealth: Payer: Self-pay

## 2023-02-25 NOTE — Telephone Encounter (Signed)
No answer, left message to call if having any issues or concerns, B.Lj Miyamoto RN 

## 2023-02-27 LAB — SURGICAL PATHOLOGY

## 2023-03-04 ENCOUNTER — Encounter: Payer: Self-pay | Admitting: Internal Medicine

## 2023-03-14 ENCOUNTER — Other Ambulatory Visit: Payer: Self-pay | Admitting: Family Medicine

## 2023-03-20 ENCOUNTER — Other Ambulatory Visit: Payer: Self-pay | Admitting: Family Medicine

## 2023-03-25 ENCOUNTER — Telehealth: Payer: Self-pay | Admitting: Pharmacist

## 2023-03-25 NOTE — Progress Notes (Signed)
Pharmacy Quality Measure Review  This patient is appearing on a report for being at risk of failing the adherence measure for cholesterol (statin) medications this calendar year.   Medication: rosuvastatin 10 mg daily Last fill date: 7/10 for 90 day supply  Confirmed fill date with pharmacy. Asked to prepare refill for patient. Contacted patient, left voicemail for patient to return my call at his convenience. Will also send MyChart.   Catie Eppie Gibson, PharmD, BCACP, CPP Clinical Pharmacist [facility] [phone]

## 2023-04-05 ENCOUNTER — Other Ambulatory Visit: Payer: Self-pay | Admitting: Cardiology

## 2023-04-05 DIAGNOSIS — I951 Orthostatic hypotension: Secondary | ICD-10-CM

## 2023-04-07 ENCOUNTER — Other Ambulatory Visit: Payer: Self-pay | Admitting: Cardiology

## 2023-04-07 DIAGNOSIS — I951 Orthostatic hypotension: Secondary | ICD-10-CM

## 2023-04-07 NOTE — Telephone Encounter (Signed)
Can you please verify if you would patient to continue and instructions - Thanks!

## 2023-04-14 ENCOUNTER — Other Ambulatory Visit: Payer: Self-pay | Admitting: Family Medicine

## 2023-04-25 DIAGNOSIS — G4733 Obstructive sleep apnea (adult) (pediatric): Secondary | ICD-10-CM | POA: Diagnosis not present

## 2023-05-23 ENCOUNTER — Other Ambulatory Visit: Payer: Self-pay | Admitting: Family Medicine

## 2023-05-28 DIAGNOSIS — L820 Inflamed seborrheic keratosis: Secondary | ICD-10-CM | POA: Diagnosis not present

## 2023-05-28 DIAGNOSIS — L821 Other seborrheic keratosis: Secondary | ICD-10-CM | POA: Diagnosis not present

## 2023-05-28 DIAGNOSIS — L218 Other seborrheic dermatitis: Secondary | ICD-10-CM | POA: Diagnosis not present

## 2023-05-28 DIAGNOSIS — L812 Freckles: Secondary | ICD-10-CM | POA: Diagnosis not present

## 2023-05-28 DIAGNOSIS — L570 Actinic keratosis: Secondary | ICD-10-CM | POA: Diagnosis not present

## 2023-05-28 DIAGNOSIS — Z85828 Personal history of other malignant neoplasm of skin: Secondary | ICD-10-CM | POA: Diagnosis not present

## 2023-05-28 DIAGNOSIS — D1801 Hemangioma of skin and subcutaneous tissue: Secondary | ICD-10-CM | POA: Diagnosis not present

## 2023-06-02 ENCOUNTER — Other Ambulatory Visit (HOSPITAL_COMMUNITY): Payer: Self-pay

## 2023-06-02 ENCOUNTER — Telehealth: Payer: Self-pay | Admitting: Cardiology

## 2023-06-02 DIAGNOSIS — I10 Essential (primary) hypertension: Secondary | ICD-10-CM

## 2023-06-02 MED ORDER — LOSARTAN POTASSIUM 25 MG PO TABS
25.0000 mg | ORAL_TABLET | Freq: Every day | ORAL | 2 refills | Status: DC
  Filled 2023-06-02: qty 90, 90d supply, fill #0
  Filled 2023-08-26: qty 90, 90d supply, fill #1
  Filled 2023-12-03: qty 90, 90d supply, fill #2

## 2023-06-02 NOTE — Telephone Encounter (Signed)
*  STAT* If patient is at the pharmacy, call can be transferred to refill team.   1. Which medications need to be refilled? (please list name of each medication and dose if known) losartan (COZAAR) 25 MG tablet (Expired)    2. Would you like to learn more about the convenience, safety, & potential cost savings by using the [facility]?      3. Are you open to using the [facility] (Type [facility]. .   4. Which pharmacy/location (including street and city if local pharmacy) is medication to be sent to?[id number] - [place], [place] - [address] AT [address] OF [address]    5. Do they need a 30 day or 90 day supply? 90

## 2023-06-02 NOTE — Telephone Encounter (Signed)
Pt's medication was sent to pt's pharmacy as requested. Confirmation received.  °

## 2023-06-05 ENCOUNTER — Other Ambulatory Visit (HOSPITAL_COMMUNITY): Payer: Self-pay

## 2023-06-15 ENCOUNTER — Other Ambulatory Visit: Payer: Self-pay | Admitting: Family Medicine

## 2023-06-26 ENCOUNTER — Other Ambulatory Visit: Payer: Self-pay | Admitting: Family Medicine

## 2023-07-01 NOTE — Telephone Encounter (Signed)
Dr. Burchette pt  

## 2023-07-28 ENCOUNTER — Other Ambulatory Visit: Payer: Self-pay | Admitting: Family Medicine

## 2023-07-28 NOTE — Telephone Encounter (Signed)
>  1 year since last visit, needs appointment, please call pt and have him schedule, then send this back to me

## 2023-07-28 NOTE — Telephone Encounter (Signed)
 Sorry this looks like Dr. Lucie Leather patient

## 2023-07-29 NOTE — Telephone Encounter (Signed)
 Left a detailed message at the patient's cell number stating to call and schedule a follow up by July per PCP.

## 2023-08-25 ENCOUNTER — Other Ambulatory Visit: Payer: Self-pay | Admitting: Family Medicine

## 2023-08-26 ENCOUNTER — Other Ambulatory Visit (HOSPITAL_COMMUNITY): Payer: Self-pay

## 2023-08-27 ENCOUNTER — Other Ambulatory Visit (HOSPITAL_COMMUNITY): Payer: Self-pay

## 2023-09-03 ENCOUNTER — Ambulatory Visit (INDEPENDENT_AMBULATORY_CARE_PROVIDER_SITE_OTHER): Admitting: Family Medicine

## 2023-09-03 ENCOUNTER — Encounter: Payer: Self-pay | Admitting: Family Medicine

## 2023-09-03 ENCOUNTER — Other Ambulatory Visit: Payer: Self-pay | Admitting: Family Medicine

## 2023-09-03 VITALS — BP 138/78 | HR 72 | Temp 97.6°F | Wt 191.7 lb

## 2023-09-03 DIAGNOSIS — R739 Hyperglycemia, unspecified: Secondary | ICD-10-CM

## 2023-09-03 DIAGNOSIS — M79605 Pain in left leg: Secondary | ICD-10-CM | POA: Diagnosis not present

## 2023-09-03 DIAGNOSIS — M79604 Pain in right leg: Secondary | ICD-10-CM | POA: Diagnosis not present

## 2023-09-03 LAB — POCT GLYCOSYLATED HEMOGLOBIN (HGB A1C): Hemoglobin A1C: 5.9 % — AB (ref 4.0–5.6)

## 2023-09-03 MED ORDER — WEGOVY 0.25 MG/0.5ML ~~LOC~~ SOAJ: 0.2500 mg | SUBCUTANEOUS | 1 refills | Status: DC

## 2023-09-03 NOTE — Patient Instructions (Signed)
 Start the Wegovy  0.25 mg [place] [facility] once weekly and after one month if tolerating well would plan to increase to 0.5 mg once weekly.   A1C today was 5.9%  Set up 3 month follow up.

## 2023-09-03 NOTE — Progress Notes (Signed)
 Established Patient Office Visit  Subjective   Patient ID: Donald Pope, male    DOB: February 03, 1946  Age: 78 y.o. MRN: [id number]  No chief complaint on file.   HPI   Rob has history of obstructive sleep apnea, history of GERD, history of lumbar back surgery, BPH, hyperlipidemia  He has history of some recent issues with bilateral calf and leg muscle soreness at night.  He had concerns specifically whether this may be circulation related.  Non-smoker.  No history of diabetes.  Does not describe any claudication type symptoms.  Describes a soreness which he differentiates from cramping  He tried previously discontinuing rosuvastatin  which did not help any.  He takes already gabapentin  and baclofen  regularly.  Has had prior back surgery as above.  Also is concerned about diabetes risk.  Has had previous A1c 6.0 2023.  He has struggled to lose weight.  He states he thinks about food frequently and has food cravings frequently.  Specifically had requested information about GLP-1 medication.  He is aware that his insurance may not cover this.  Past Medical History:  Diagnosis Date   Allergy    Anxiety    Back pain    Benign prostatic hypertrophy    possible   Bilateral leg cramps    CAD, NATIVE VESSEL 10/23/2009   Chest pain    DEPRESSION 09/13/2008   Depression    False positive stress test    history of, suggestive of inferior ischemia   GERD 09/13/2008   History of GI bleed    HYPERLIPIDEMIA 09/13/2008   Hypertension    Meniere's disease    OBSTRUCTIVE SLEEP APNEA 10/23/2009   cpap   RBBB 10/26/2009   [place]   Sciatic nerve pain    Sleep apnea    Cpap   SOB (shortness of breath)    Spinal stenosis of lumbar region 08/18/2012   THROMBOCYTOPENIA 09/13/2008   Past Surgical History:  Procedure Laterality Date   BACK SURGERY     CARDIAC CATHETERIZATION     10 yrs ago   COLONOSCOPY  06/27/2016   MAXIMUM ACCESS (MAS)POSTERIOR LUMBAR INTERBODY FUSION (PLIF) 1 LEVEL N/A  05/27/2013   Procedure: FOR MAXIMUM ACCESS (MAS) POSTERIOR LUMBAR INTERBODY FUSION (PLIF) 1 LEVEL ,LUMBAR FOUR-FIVE;  Surgeon: Isadora Mar, MD;  Location: [facility];  Service: Neurosurgery;  Laterality: N/A;  FOR MAXIMUM ACCESS (MAS) POSTERIOR LUMBAR INTERBODY FUSION (PLIF) 1 LEVEL ,LUMBAR FOUR-FIVE   NASAL POLYP EXCISION     TONSILLECTOMY      reports that he has never smoked. He has never used smokeless tobacco. He reports current alcohol use of about 1.0 standard drink of alcohol per week. He reports that he does not use drugs. family history includes Alcoholism in his father; COPD in his father; Colon polyps in his father; Heart disease in his father; Heart disease (age of onset: 44) in his mother; Hyperlipidemia in his sister; Hypertension in his sister. Allergies  Allergen Reactions   Codeine Sulfate Other (See Comments)    urination problems   Latex Rash   Tape Rash    Review of Systems  Constitutional:  Negative for malaise/fatigue.  Eyes:  Negative for blurred vision.  Respiratory:  Negative for shortness of breath.   Cardiovascular:  Negative for chest pain.  Neurological:  Negative for dizziness, weakness and headaches.      Objective:     BP 138/78 (BP Location: Left Arm, Cuff Size: Normal)   Pulse 72  Temp 97.6 F (36.4 C) (Oral)   Wt 191 lb 11.2 oz (87 kg)   SpO2 98%   BMI 29.15 kg/m  BP Readings from Last 3 Encounters:  09/03/23 138/78  02/24/23 125/68  01/06/23 (!) 143/82   Wt Readings from Last 3 Encounters:  09/03/23 191 lb 11.2 oz (87 kg)  02/24/23 190 lb (86.2 kg)  01/06/23 190 lb (86.2 kg)      Physical Exam Vitals reviewed.  Constitutional:      General: He is not in acute distress.    Appearance: He is well-developed. He is not ill-appearing.  Eyes:     Pupils: Pupils are equal, round, and reactive to light.  Neck:     Thyroid : No thyromegaly.  Cardiovascular:     Rate and Rhythm: Normal rate and regular rhythm.     Comments: Feet  are warm to touch with 2+ dorsalis pedis and posterior tibial pulses bilaterally.  Good capillary refill throughout. Pulmonary:     Effort: Pulmonary effort is normal. No respiratory distress.     Breath sounds: Normal breath sounds. No wheezing or rales.  Musculoskeletal:     Cervical back: Neck supple.     Right lower leg: No edema.     Left lower leg: No edema.  Neurological:     Mental Status: He is alert and oriented to person, place, and time.      Results for orders placed or performed in visit on 09/03/23  POC HgB A1c  Result Value Ref Range   Hemoglobin A1C 5.9 (A) 4.0 - 5.6 %   HbA1c POC (<> result, manual entry)     HbA1c, POC (prediabetic range)     HbA1c, POC (controlled diabetic range)        The ASCVD Risk score (Arnett DK, et al., 2019) failed to calculate for the following reasons:   The valid total cholesterol range is 130 to 320 mg/dL    Assessment & Plan:   #1 bilateral leg pain.  He is not describing claudication symptoms and has no evidence clinically to suggest peripheral vascular disease.  Excellent foot pulses and good capillary refill.  Is also not describing any typical leg cramps.  He has tried multiple things including baclofen  and magnesium  without success.  Takes gabapentin  at baseline.  Has had previous back surgery.  Etiology not clear.  Query related to his neuropathy hx.  Does take statin but has been off statin for over a month previously without any change in symptoms.  He states that the current symptoms are tolerable and not interfering with sleep.  We did discuss possible addition of Cymbalta to his Gabapentin  if symptoms persist or worsen.    #2 hyperglycemia.  A1c today 5.9%.  Continue low glycemic diet.  Patient specifically has interest in GLP-1 medication.  He has struggled to lose weight.  His current BMI is around 29 with comorbidities including hyperlipidemia and hyperglycemia.  He is aware insurance may not cover.  He does not have any  contraindications such as pancreatitis or family history of medullary thyroid  cancer or multiple endocrine neoplasia.  Will look at possibility of starting Wegovy  0.25 mg subcutaneous once weekly and if he can get this covered and tolerates well in 1 month titrate to 0.5 mg once weekly with 43-month office follow-up.   Glean Lamy, MD

## 2023-09-05 NOTE — Telephone Encounter (Signed)
 Left message for patient to return my call.

## 2023-09-08 ENCOUNTER — Telehealth: Payer: Self-pay

## 2023-09-08 NOTE — Telephone Encounter (Signed)
 Copied from CRM [id number]. Topic: General - Other >> Sep 08, 2023 11:02 AM Howard Macho wrote: Reason for CRM: patient returning a call to Mykal from the office

## 2023-09-10 NOTE — Telephone Encounter (Signed)
 Please see encounter from 09/03/2023

## 2023-09-17 NOTE — Telephone Encounter (Signed)
 Patient informed of the message below. Rx denial sent.

## 2023-09-29 ENCOUNTER — Other Ambulatory Visit: Payer: Self-pay | Admitting: Family Medicine

## 2023-10-02 ENCOUNTER — Other Ambulatory Visit: Payer: Self-pay | Admitting: Family Medicine

## 2023-10-03 DIAGNOSIS — G4733 Obstructive sleep apnea (adult) (pediatric): Secondary | ICD-10-CM | POA: Diagnosis not present

## 2023-10-10 DIAGNOSIS — R051 Acute cough: Secondary | ICD-10-CM | POA: Diagnosis not present

## 2023-10-15 ENCOUNTER — Ambulatory Visit (INDEPENDENT_AMBULATORY_CARE_PROVIDER_SITE_OTHER): Admitting: Family Medicine

## 2023-10-15 ENCOUNTER — Encounter: Payer: Self-pay | Admitting: Family Medicine

## 2023-10-15 VITALS — BP 126/64 | HR 69 | Temp 97.6°F | Wt 187.4 lb

## 2023-10-15 DIAGNOSIS — J189 Pneumonia, unspecified organism: Secondary | ICD-10-CM | POA: Diagnosis not present

## 2023-10-15 NOTE — Progress Notes (Signed)
 Established Patient Office Visit  Subjective   Patient ID: Donald Pope, male    DOB: 01/30/1946  Age: 78 y.o. MRN: [id number]  Chief Complaint  Patient presents with   Pneumonia    Patient was diagnoised with pneumonia, x5 days     HPI   Donald Pope is seen for follow-up regarding recently diagnosed pneumonia.  He and his wife were down in [place]  near [place] visiting family and helping watch grandchildren.  On the way down he developed some chills and increasing fatigue.  Was seen there on May 30 at urgent care and chest x-ray showed faint right basilar opacity.  He was prescribed doxycycline, Tessalon , and Promethazine DM cough syrup.  He states he had 3 weeks of cough prior to then.  Feels some better overall.  Remains on doxycycline.  Tolerating well.  Has not had much improvement in cough with Tessalon  or promethazine cough syrup.  Previous intolerance with codeine.  He has had urinary retention with codeine in the past.  Still has particularly severe cough at night.  No fever.  No hemoptysis.  No pleuritic pain.  Never smoked.  No known sick contacts.  We were able to pull up records from [facility] from down in [place] .  Past Medical History:  Diagnosis Date   Allergy    Anxiety    Back pain    Benign prostatic hypertrophy    possible   Bilateral leg cramps    CAD, NATIVE VESSEL 10/23/2009   Chest pain    DEPRESSION 09/13/2008   Depression    False positive stress test    history of, suggestive of inferior ischemia   GERD 09/13/2008   History of GI bleed    HYPERLIPIDEMIA 09/13/2008   Hypertension    Meniere's disease    OBSTRUCTIVE SLEEP APNEA 10/23/2009   cpap   RBBB 10/26/2009   [place]   Sciatic nerve pain    Sleep apnea    Cpap   SOB (shortness of breath)    Spinal stenosis of lumbar region 08/18/2012   THROMBOCYTOPENIA 09/13/2008   Past Surgical History:  Procedure Laterality Date   BACK SURGERY     CARDIAC CATHETERIZATION     10 yrs  ago   COLONOSCOPY  06/27/2016   MAXIMUM ACCESS (MAS)POSTERIOR LUMBAR INTERBODY FUSION (PLIF) 1 LEVEL N/A 05/27/2013   Procedure: FOR MAXIMUM ACCESS (MAS) POSTERIOR LUMBAR INTERBODY FUSION (PLIF) 1 LEVEL ,LUMBAR FOUR-FIVE;  Surgeon: Isadora Mar, MD;  Location: [facility];  Service: Neurosurgery;  Laterality: N/A;  FOR MAXIMUM ACCESS (MAS) POSTERIOR LUMBAR INTERBODY FUSION (PLIF) 1 LEVEL ,LUMBAR FOUR-FIVE   NASAL POLYP EXCISION     TONSILLECTOMY      reports that he has never smoked. He has never used smokeless tobacco. He reports current alcohol use of about 1.0 standard drink of alcohol per week. He reports that he does not use drugs. family history includes Alcoholism in his father; COPD in his father; Colon polyps in his father; Heart disease in his father; Heart disease (age of onset: 43) in his mother; Hyperlipidemia in his sister; Hypertension in his sister. Allergies  Allergen Reactions   Codeine Sulfate Other (See Comments)    urination problems   Latex Rash   Tape Rash    Review of Systems  Constitutional:  Negative for chills and fever.  Respiratory:  Positive for cough. Negative for hemoptysis, shortness of breath and wheezing.   Cardiovascular:  Negative for chest pain.  Gastrointestinal:  Negative for nausea and vomiting.  Genitourinary:  Negative for dysuria.      Objective:     BP 126/64 (BP Location: Left Arm, Patient Position: Sitting, Cuff Size: Normal)   Pulse 69   Temp 97.6 F (36.4 C) (Oral)   Wt 187 lb 6.4 oz (85 kg)   SpO2 96%   BMI 28.49 kg/m  BP Readings from Last 3 Encounters:  10/15/23 126/64  09/03/23 138/78  02/24/23 125/68   Wt Readings from Last 3 Encounters:  10/15/23 187 lb 6.4 oz (85 kg)  09/03/23 191 lb 11.2 oz (87 kg)  02/24/23 190 lb (86.2 kg)      Physical Exam Vitals reviewed.  Constitutional:      General: He is not in acute distress.    Appearance: He is not ill-appearing or toxic-appearing.  HENT:     Right Ear:  Tympanic membrane normal.     Left Ear: Tympanic membrane normal.  Cardiovascular:     Rate and Rhythm: Normal rate and regular rhythm.  Pulmonary:     Effort: Pulmonary effort is normal. No respiratory distress.     Breath sounds: Normal breath sounds. No wheezing or rales.  Musculoskeletal:     Right lower leg: No edema.     Left lower leg: No edema.  Neurological:     Mental Status: He is alert.      No results found for any visits on 10/15/23.  Last CBC Lab Results  Component Value Date   WBC 9.0 11/13/2022   HGB 13.6 11/13/2022   HCT 40.5 11/13/2022   MCV 89.6 11/13/2022   MCH 30.1 11/13/2022   RDW 13.7 11/13/2022   PLT 113 (L) 11/13/2022   Last metabolic panel Lab Results  Component Value Date   GLUCOSE 104 (H) 12/04/2022   NA 143 12/04/2022   K 4.5 12/04/2022   CL 102 12/04/2022   CO2 23 12/04/2022   BUN 29 (H) 12/04/2022   CREATININE 1.22 12/04/2022   EGFR 61 12/04/2022   CALCIUM  9.6 12/04/2022   PROT 7.5 01/30/2022   ALBUMIN 4.4 01/30/2022   LABGLOB 2.6 03/09/2020   AGRATIO 1.7 03/09/2020   BILITOT 0.5 01/30/2022   ALKPHOS 49 01/30/2022   AST 24 01/30/2022   ALT 16 01/30/2022   ANIONGAP 11 11/13/2022   Last lipids Lab Results  Component Value Date   CHOL 105 12/04/2022   HDL 31 (L) 12/04/2022   LDLCALC 48 12/04/2022   LDLDIRECT 129.0 01/30/2022   TRIG 153 (H) 12/04/2022   CHOLHDL 6 01/30/2022   Last hemoglobin A1c Lab Results  Component Value Date   HGBA1C 5.9 (A) 09/03/2023   Last thyroid  functions Lab Results  Component Value Date   TSH 4.28 01/30/2022   T3TOTAL 121 03/09/2020   T4TOTAL 5.5 03/09/2020      The ASCVD Risk score (Arnett DK, et al., 2019) failed to calculate for the following reasons:   The valid total cholesterol range is 130 to 320 mg/dL    Assessment & Plan:   Recent cough with reported right basilar infiltrate on chest x-ray. .  Patient was started on doxycycline and feeling some better.  Still has cough.   Reluctant to use codeine or hydrocodone cough syrup with his history of urine retention with codeine.  Continue over-the-counter cough medications as needed.  Follow-up immediately for any fever or increased shortness of breath.  Be in touch if cough not resolving over the next couple of weeks.  30 minutes  were spent between face-to-face and non-face-to-face time assessing patient and reviewing recent records and discussing treatment options  Glean Lamy, MD

## 2023-10-15 NOTE — Patient Instructions (Signed)
 Finish out the Doxycycline  Follow up immediately for any fever or increased shortness of breath  Reluctant to use hydrocodone cough syrup secondary to past history of urine retention.

## 2023-12-03 ENCOUNTER — Other Ambulatory Visit: Payer: Self-pay | Admitting: Family Medicine

## 2023-12-28 ENCOUNTER — Other Ambulatory Visit: Payer: Self-pay | Admitting: Cardiology

## 2023-12-28 DIAGNOSIS — E782 Mixed hyperlipidemia: Secondary | ICD-10-CM

## 2024-01-01 ENCOUNTER — Other Ambulatory Visit: Payer: Self-pay | Admitting: Family Medicine

## 2024-01-05 ENCOUNTER — Other Ambulatory Visit: Payer: Self-pay | Admitting: Family Medicine

## 2024-01-06 ENCOUNTER — Other Ambulatory Visit: Payer: Self-pay | Admitting: Family Medicine

## 2024-01-06 NOTE — Telephone Encounter (Signed)
 Copied from CRM #[id number]. Topic: Clinical - Medication Refill >> Jan 06, 2024  8:59 AM Mesmerise C wrote: Medication:  gabapentin  (NEURONTIN ) 600 MG tablet   Has the patient contacted their pharmacy? Yes (Agent: If no, request that the patient contact the pharmacy for the refill. If patient does not wish to contact the pharmacy document the reason why and proceed with request.) (Agent: If yes, when and what did the pharmacy advise?) They reached out for refill This is the patient's preferred pharmacy:  [id number] - [place], [place] - [address] AT CORNER OF [facility] [address] [place] [address] Phone: [phone] Fax: [phone]  Is this the correct pharmacy for this prescription? Yes If no, delete pharmacy and type the correct one.   Has the prescription been filled recently? No  Is the patient out of the medication? No  Has the patient been seen for an appointment in the last year OR does the patient have an upcoming appointment? Yes  Can we respond through MyChart? Yes  Agent: Please be advised that Rx refills may take up to 3 business days. We ask that you follow-up with your pharmacy.

## 2024-01-07 ENCOUNTER — Ambulatory Visit: Payer: PPO | Admitting: Cardiology

## 2024-01-07 ENCOUNTER — Ambulatory Visit: Attending: Cardiology | Admitting: Cardiology

## 2024-01-07 ENCOUNTER — Encounter: Payer: Self-pay | Admitting: Cardiology

## 2024-01-07 VITALS — BP 108/66 | HR 77 | Resp 16 | Ht 68.0 in | Wt 193.8 lb

## 2024-01-07 DIAGNOSIS — I10 Essential (primary) hypertension: Secondary | ICD-10-CM

## 2024-01-07 DIAGNOSIS — E782 Mixed hyperlipidemia: Secondary | ICD-10-CM | POA: Diagnosis not present

## 2024-01-07 NOTE — Patient Instructions (Signed)
 Medication Instructions:  Your physician recommends that you continue on your current medications as directed. Please refer to the Current Medication list given to you today.  *If you need a refill on your cardiac medications before your next appointment, please call your pharmacy*  Lab Work: none If you have labs (blood work) drawn today and your tests are completely normal, you will receive your results only by: MyChart Message (if you have MyChart) OR A paper copy in the mail If you have any lab test that is abnormal or we need to change your treatment, we will call you to review the results.  Testing/Procedures: none  Follow-Up: At [facility], you and your health needs are our priority.  As part of our continuing mission to provide you with exceptional heart care, our providers are all part of one team.  This team includes your primary Cardiologist (physician) and Advanced Practice Providers or APPs (Physician Assistants and Nurse Practitioners) who all work together to provide you with the care you need, when you need it.  Your next appointment:   As needed  Provider:   Dr Berry Bristol    We recommend signing up for the patient portal called "MyChart".  Sign up information is provided on this After Visit Summary.  MyChart is used to connect with patients for Virtual Visits (Telemedicine).  Patients are able to view lab/test results, encounter notes, upcoming appointments, etc.  Non-urgent messages can be sent to your provider as well.   To learn more about what you can do with MyChart, go to [email or web address].   Other Instructions

## 2024-01-07 NOTE — Progress Notes (Signed)
 Cardiology Office Note:  .   Date:  01/07/2024  ID:  DICKSON KOSTELNIK, DOB [birth year], MRN [id number] PCP: Micheal Wolm ORN, MD  [facility] Providers Cardiologist:  Victory ORN Claudene DOUGLAS, MD (Inactive)   History of Present Illness: .   Donald Pope is a 78 y.o. Fairly active Caucasian male patient with nonobstructive coronary disease by cardiac catheterization in 2007, hypercholesterolemia, OSA on CPAP, patient presented with syncope on 11/13/2022 to the emergency room after he was playing pickle ball, got lightheaded and then had syncope.  Syncope felt to be secondary to orthostatic hypotension and also vasovagal episodes previously as he had clear-cut premonitory symptoms.   He is presently on losartan  for supine hypertension which he is tolerating.  This is annual visit.  He has discontinued using midodrine  that was started years ago when he presented to the emergency room with syncope.  He was mildly orthostatic on his last office visit with me a year ago.  Except for occasional dizziness when he suddenly stands up he is asymptomatic and continues to exercise on a regular basis.  He is accompanied by his wife.  Cardiac Studies relevent.    Coronary angiogram 09/24/2005:  Mild scattered disease and normal LVEF right dominant  Treadmill Exercise Stress 01/02/2023:  Achieved 7.5 METS, stress terminated due to dyspnea.  Stress EKG was mildly positive for ischemia with 1 mm horizontal ST depression in recovery.  Echocardiogram 12/31/2022: Normal LVEF no wall motion abnormality, normal diastolic function.   Discussed the use of AI scribe software for clinical note transcription with the patient, who gave verbal consent to proceed.  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  He experiences mild dizziness when bending over, attributed to Meniere's  disease, but no current dizziness or syncope. Orthostatic hypotension noted previously has resolved. He is on losartan  for hypertension, which is effective.  He has coronary artery disease with a heart catheterization in 2007 showing minimal scattered disease. A treadmill stress test and echocardiogram in August 2024 showed good exercise capacity and normal heart function. He is on Crestor  10 mg for cholesterol management, with LDL at 48. No exertional chest pain, chest tightness, or heaviness.  He uses CPAP for sleep apnea and remains physically active with walking, yoga, and water exercises. No recent syncope. Previously prescribed midodrine  but not currently using it.   Labs   Lab Results  Component Value Date   CHOL 105 12/04/2022   HDL 31 (L) 12/04/2022   LDLCALC 48 12/04/2022   LDLDIRECT 129.0 01/30/2022   TRIG 153 (H) 12/04/2022   CHOLHDL 6 01/30/2022      Latest Ref Rng & Units 11/13/2022    3:54 PM 01/30/2022    9:14 AM 05/16/2021   11:41 AM  CBC  WBC 4.0 - 10.5 K/uL 9.0  5.5  6.5   Hemoglobin 13.0 - 17.0 g/dL 86.3  86.6  9.8   Hematocrit 39.0 - 52.0 % 40.5  40.5  30.5   Platelets 150 - 400 K/uL 113  121.0  148.0    Lab Results  Component Value Date   HGBA1C 5.9 (A) 09/03/2023    Lab Results  Component Value Date   TSH 4.28 01/30/2022    ROS  Review of Systems  Cardiovascular:  Negative for chest pain, dyspnea on exertion and leg swelling.   Physical Exam:  VS:  BP 108/66 (BP Location: Left Arm, Patient Position: Sitting, Cuff Size: Large)   Pulse 77   Resp 16   Ht 5' 8 (1.727 m)   Wt 193 lb 12.8 oz (87.9 kg)   SpO2 93%   BMI 29.47 kg/m    Wt Readings from Last 3 Encounters:  01/07/24 193 lb 12.8 oz (87.9 kg)  10/15/23 187 lb 6.4 oz (85 kg)  09/03/23 191 lb 11.2 oz (87 kg)    BP Readings from Last 3 Encounters:  01/07/24 108/66  10/15/23 126/64  09/03/23 138/78   Orthostatic VS for the past 24 hrs (Last 3 readings):  BP- Lying Pulse- Lying BP-  Sitting Pulse- Sitting BP- Standing at 0 minutes Pulse- Standing at 0 minutes  01/07/24 1149 128/67 69 127/72 71 136/71 75   Physical Exam Neck:     Vascular: No carotid bruit or JVD.  Cardiovascular:     Rate and Rhythm: Normal rate and regular rhythm.     Pulses: Intact distal pulses.     Heart sounds: Normal heart sounds. No murmur heard.    No gallop.  Pulmonary:     Effort: Pulmonary effort is normal.     Breath sounds: Normal breath sounds.  Abdominal:     General: Bowel sounds are normal.     Palpations: Abdomen is soft.  Musculoskeletal:     Right lower leg: No edema.     Left lower leg: No edema.    EKG:    EKG Interpretation Date/Time:  Wednesday January 07 2024 11:48:09 EDT Ventricular Rate:  72 PR Interval:  200 QRS Duration:  130 QT Interval:  410 QTC Calculation: 448 R Axis:   9  Text Interpretation: EKG 01/07/2024: Normal sinus rhythm at rate of 72 bpm, IRBBB.  Compared to 11/13/2022, no change. Confirmed by Derrek Puff, Jagadeesh ([id number]) on 01/07/2024 11:58:02 AM    ASSESSMENT AND PLAN: .      ICD-10-CM   1. Primary hypertension  I10 EKG 12-Lead    2. Mixed hyperlipidemia  E78.2      Assessment & Plan Primary Hypertension Hypertension is well controlled with losartan . Mild dizziness when bending over is likely due to medication effects from losartan , citalopram , and gabapentin . - Continue losartan  for blood pressure control - Advise caution when standing up quickly to prevent dizziness - He is not orthostatic today.  Suspect he has had resolution of orthostatic hypotension.  Coronary artery disease Coronary artery disease with known minor blockages from a 2007 cardiac catheterization. No current symptoms of exertional chest pain or other cardiac symptoms. Recent stress test and echocardiogram show good exercise capacity and normal heart function. Medical therapy is preferred over invasive procedures due to the absence of symptoms. - Continue current medical  therapy for primary prevention - No need for cardiac CT scan as there are no symptoms warranting further investigation  Hyperlipidemia Hyperlipidemia is well controlled with Crestor . LDL cholesterol is at 48 mg/dL, indicating effective lipid management. - Continue Crestor  for lipid control   Follow up: PRN  Signed,  Gordy Bergamo, MD, [facility] 01/07/2024, 12:11 PM [facility] [address] [place], [place] [address] Phone: [phone]. Fax:  [phone]

## 2024-01-29 ENCOUNTER — Other Ambulatory Visit: Payer: Self-pay | Admitting: Cardiology

## 2024-01-29 DIAGNOSIS — E782 Mixed hyperlipidemia: Secondary | ICD-10-CM

## 2024-03-05 ENCOUNTER — Other Ambulatory Visit: Payer: Self-pay | Admitting: Cardiology

## 2024-03-05 ENCOUNTER — Other Ambulatory Visit: Payer: Self-pay | Admitting: Family Medicine

## 2024-03-05 DIAGNOSIS — I10 Essential (primary) hypertension: Secondary | ICD-10-CM

## 2024-03-09 ENCOUNTER — Other Ambulatory Visit (HOSPITAL_COMMUNITY): Payer: Self-pay

## 2024-03-09 MED ORDER — LOSARTAN POTASSIUM 25 MG PO TABS
25.0000 mg | ORAL_TABLET | Freq: Every day | ORAL | 3 refills | Status: AC
  Filled 2024-03-09: qty 90, 90d supply, fill #0
  Filled 2024-06-07: qty 90, 90d supply, fill #1

## 2024-03-30 ENCOUNTER — Other Ambulatory Visit: Payer: Self-pay | Admitting: Family Medicine

## 2024-04-01 ENCOUNTER — Ambulatory Visit: Admitting: Family Medicine

## 2024-06-07 ENCOUNTER — Other Ambulatory Visit: Payer: Self-pay | Admitting: Family Medicine

## 2024-06-16 ENCOUNTER — Other Ambulatory Visit: Payer: Self-pay | Admitting: Family Medicine

## 2024-06-22 ENCOUNTER — Ambulatory Visit: Admitting: Family Medicine
